# Patient Record
Sex: Female | Born: 1970 | Race: Black or African American | Hispanic: No | Smoking: Never smoker
Health system: Southern US, Community
[De-identification: ages and names within clinical notes are randomized; demographics above are authoritative.]

## PROBLEM LIST (undated history)

## (undated) ENCOUNTER — Emergency Department (HOSPITAL_COMMUNITY): Admission: EM | Disposition: A | Discharge status: Home / Self Care | Payer: BLUE CROSS/BLUE SHIELD

## (undated) VITALS — BP 122/68 | HR 98 | Temp 98.8°F | Resp 18

## (undated) DIAGNOSIS — F319 Bipolar disorder, unspecified: Secondary | ICD-10-CM

## (undated) DIAGNOSIS — F209 Schizophrenia, unspecified: Secondary | ICD-10-CM

## (undated) DIAGNOSIS — F32A Depression, unspecified: Secondary | ICD-10-CM

## (undated) DIAGNOSIS — F329 Major depressive disorder, single episode, unspecified: Secondary | ICD-10-CM

---

## 2006-05-25 ENCOUNTER — Ambulatory Visit: Payer: Self-pay | Admitting: Family Medicine

## 2006-05-27 ENCOUNTER — Ambulatory Visit: Payer: Self-pay | Admitting: *Deleted

## 2006-05-30 ENCOUNTER — Ambulatory Visit: Payer: Self-pay | Admitting: Family Medicine

## 2007-05-26 ENCOUNTER — Inpatient Hospital Stay (HOSPITAL_COMMUNITY): Admission: AD | Admit: 2007-05-26 | Discharge: 2007-05-26 | Payer: Self-pay | Admitting: Obstetrics

## 2007-06-12 DIAGNOSIS — H60509 Unspecified acute noninfective otitis externa, unspecified ear: Secondary | ICD-10-CM

## 2007-08-31 ENCOUNTER — Inpatient Hospital Stay (HOSPITAL_COMMUNITY): Admission: AD | Admit: 2007-08-31 | Discharge: 2007-09-02 | Payer: Self-pay | Admitting: Obstetrics

## 2007-11-10 ENCOUNTER — Inpatient Hospital Stay (HOSPITAL_COMMUNITY): Admission: AD | Admit: 2007-11-10 | Discharge: 2007-11-15 | Payer: Self-pay | Admitting: Psychiatry

## 2007-11-13 ENCOUNTER — Ambulatory Visit: Payer: Self-pay | Admitting: Psychiatry

## 2009-01-04 ENCOUNTER — Emergency Department (HOSPITAL_COMMUNITY): Admission: EM | Admit: 2009-01-04 | Discharge: 2009-01-04 | Payer: Self-pay | Admitting: Emergency Medicine

## 2009-01-05 ENCOUNTER — Emergency Department (HOSPITAL_COMMUNITY): Admission: EM | Admit: 2009-01-05 | Discharge: 2009-01-06 | Payer: Self-pay | Admitting: Emergency Medicine

## 2009-01-07 ENCOUNTER — Ambulatory Visit: Payer: Self-pay | Admitting: Psychiatry

## 2009-01-07 ENCOUNTER — Emergency Department (HOSPITAL_COMMUNITY): Admission: EM | Admit: 2009-01-07 | Discharge: 2009-01-07 | Payer: Self-pay | Admitting: Emergency Medicine

## 2009-01-07 ENCOUNTER — Inpatient Hospital Stay (HOSPITAL_COMMUNITY): Admission: EM | Admit: 2009-01-07 | Discharge: 2009-01-15 | Payer: Self-pay | Admitting: Psychiatry

## 2009-01-17 ENCOUNTER — Emergency Department (HOSPITAL_COMMUNITY): Admission: EM | Admit: 2009-01-17 | Discharge: 2009-01-17 | Payer: Self-pay | Admitting: Emergency Medicine

## 2009-01-17 ENCOUNTER — Inpatient Hospital Stay (HOSPITAL_COMMUNITY): Admission: AD | Admit: 2009-01-17 | Discharge: 2009-01-31 | Payer: Self-pay | Admitting: Psychiatry

## 2010-11-30 LAB — CBC
HCT: 35.1 % — ABNORMAL LOW (ref 36.0–46.0)
HCT: 35.7 % — ABNORMAL LOW (ref 36.0–46.0)
Hemoglobin: 12 g/dL (ref 12.0–15.0)
Hemoglobin: 12.3 g/dL (ref 12.0–15.0)
MCHC: 34.3 g/dL (ref 30.0–36.0)
MCV: 88.6 fL (ref 78.0–100.0)
RDW: 12.2 % (ref 11.5–15.5)
RDW: 13.3 % (ref 11.5–15.5)
WBC: 5.2 10*3/uL (ref 4.0–10.5)

## 2010-12-01 LAB — BASIC METABOLIC PANEL
CO2: 26 mEq/L (ref 19–32)
Calcium: 9.3 mg/dL (ref 8.4–10.5)
Chloride: 104 mEq/L (ref 96–112)
Creatinine, Ser: 0.6 mg/dL (ref 0.4–1.2)
Creatinine, Ser: 0.55 mg/dL (ref 0.4–1.2)
GFR calc Af Amer: >60 mL/min (ref >60)
GFR calc Af Amer: >60 mL/min (ref >60)
GFR calc non Af Amer: >60 mL/min (ref >60)
GFR calc non Af Amer: >60 mL/min (ref >60)
Potassium: 3.9 mEq/L (ref 3.5–5.1)
Sodium: 139 mEq/L (ref 135–145)

## 2010-12-01 LAB — DIFFERENTIAL
Basophils Absolute: 0 10*3/uL (ref 0.0–0.1)
Basophils Absolute: 0 10*3/uL (ref 0.0–0.1)
Basophils Absolute: 0 10*3/uL (ref 0.0–0.1)
Basophils Relative: 1 % (ref 0–1)
Basophils Relative: 1 % (ref 0–1)
Eosinophils Absolute: 0 10*3/uL (ref 0.0–0.7)
Eosinophils Relative: 2 % (ref 0–5)
Lymphocytes Relative: 27 % (ref 12–46)
Lymphocytes Relative: 29 % (ref 12–46)
Lymphocytes Relative: 33 % (ref 12–46)
Lymphocytes Relative: 25 % (ref 12–46)
Lymphs Abs: 1.3 10*3/uL (ref 0.7–4.0)
Monocytes Absolute: 0.2 10*3/uL (ref 0.1–1.0)
Monocytes Relative: 5 % (ref 3–12)
Monocytes Relative: 3 % (ref 3–12)
Neutro Abs: 2.8 10*3/uL (ref 1.7–7.7)
Neutro Abs: 3 10*3/uL (ref 1.7–7.7)
Neutro Abs: 3.5 10*3/uL (ref 1.7–7.7)
Neutrophils Relative %: 60 % (ref 43–77)
Neutrophils Relative %: 65 % (ref 43–77)
Neutrophils Relative %: 65 % (ref 43–77)
Neutrophils Relative %: 70 % (ref 43–77)

## 2010-12-01 LAB — URINALYSIS, ROUTINE W REFLEX MICROSCOPIC
Bilirubin Urine: NEGATIVE
Glucose, UA: NEGATIVE mg/dL
Hgb urine dipstick: NEGATIVE
Hgb urine dipstick: NEGATIVE
Ketones, ur: 40 mg/dL — AB
Ketones, ur: NEGATIVE mg/dL
Nitrite: NEGATIVE
Nitrite: NEGATIVE
Nitrite: NEGATIVE
Protein, ur: NEGATIVE mg/dL
Protein, ur: NEGATIVE mg/dL
Specific Gravity, Urine: 1.008 (ref 1.005–1.030)
Specific Gravity, Urine: 1.013 (ref 1.005–1.030)
Specific Gravity, Urine: 1.01 (ref 1.005–1.030)
Urobilinogen, UA: 0.2 mg/dL (ref 0.0–1.0)
Urobilinogen, UA: 0.2 mg/dL (ref 0.0–1.0)
pH: 6.5 (ref 5.0–8.0)
pH: 7 (ref 5.0–8.0)
pH: 7 (ref 5.0–8.0)

## 2010-12-01 LAB — CBC
HCT: 37.5 % (ref 36.0–46.0)
HCT: 40.6 % (ref 36.0–46.0)
Hemoglobin: 12.7 g/dL (ref 12.0–15.0)
Hemoglobin: 13.7 g/dL (ref 12.0–15.0)
Hemoglobin: 13.9 g/dL (ref 12.0–15.0)
MCHC: 32.5 g/dL (ref 30.0–36.0)
MCV: 88.3 fL (ref 78.0–100.0)
MCV: 88.6 fL (ref 78.0–100.0)
MCV: 87.9 fL (ref 78.0–100.0)
Platelets: 249 10*3/uL (ref 150–400)
Platelets: 283 10*3/uL (ref 150–400)
RBC: 4.24 MIL/uL (ref 3.87–5.11)
RBC: 4.76 MIL/uL (ref 3.87–5.11)
RBC: 4.58 MIL/uL (ref 3.87–5.11)
RDW: 12.6 % (ref 11.5–15.5)
RDW: 12.8 % (ref 11.5–15.5)
WBC: 5 10*3/uL (ref 4.0–10.5)
WBC: 5 10*3/uL (ref 4.0–10.5)

## 2010-12-01 LAB — ETHANOL: Alcohol, Ethyl (B): <5 mg/dL (ref 0–10)

## 2010-12-01 LAB — DRUGS OF ABUSE SCREEN W/O ALC, ROUTINE URINE
Barbiturate Quant, Ur: NEGATIVE
Cocaine Metabolites: NEGATIVE
Methadone: NEGATIVE
Phencyclidine (PCP): NEGATIVE

## 2010-12-01 LAB — URINALYSIS, MICROSCOPIC ONLY
Glucose, UA: NEGATIVE mg/dL
Hgb urine dipstick: NEGATIVE
Ketones, ur: NEGATIVE mg/dL
Protein, ur: NEGATIVE mg/dL

## 2010-12-01 LAB — COMPREHENSIVE METABOLIC PANEL
ALT: 19 U/L (ref 0–35)
AST: 16 U/L (ref 0–37)
AST: 17 U/L (ref 0–37)
Albumin: 3.7 g/dL (ref 3.5–5.2)
Alkaline Phosphatase: 31 U/L — ABNORMAL LOW (ref 39–117)
Alkaline Phosphatase: 38 U/L — ABNORMAL LOW (ref 39–117)
BUN: 7 mg/dL (ref 6–23)
BUN: 7 mg/dL (ref 6–23)
CO2: 24 mEq/L (ref 19–32)
CO2: 27 mEq/L (ref 19–32)
Chloride: 105 mEq/L (ref 96–112)
Chloride: 108 mEq/L (ref 96–112)
Creatinine, Ser: 0.51 mg/dL (ref 0.4–1.2)
Creatinine, Ser: 0.54 mg/dL (ref 0.4–1.2)
GFR calc non Af Amer: >60 mL/min (ref >60)
GFR calc non Af Amer: >60 mL/min (ref >60)
Glucose, Bld: 106 mg/dL — ABNORMAL HIGH (ref 70–99)
Glucose, Bld: 109 mg/dL — ABNORMAL HIGH (ref 70–99)
Potassium: 3.7 mEq/L (ref 3.5–5.1)
Potassium: 3.9 mEq/L (ref 3.5–5.1)
Sodium: 137 mEq/L (ref 135–145)
Total Bilirubin: 0.3 mg/dL (ref 0.3–1.2)
Total Bilirubin: 0.5 mg/dL (ref 0.3–1.2)
Total Protein: 7 g/dL (ref 6.0–8.3)

## 2010-12-01 LAB — RAPID URINE DRUG SCREEN, HOSP PERFORMED
Amphetamines: NOT DETECTED
Barbiturates: NOT DETECTED
Barbiturates: NOT DETECTED
Barbiturates: NOT DETECTED
Benzodiazepines: NOT DETECTED
Cocaine: NOT DETECTED
Cocaine: NOT DETECTED
Opiates: NOT DETECTED
Opiates: NOT DETECTED
Tetrahydrocannabinol: NOT DETECTED
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
## 2011-01-05 NOTE — H&P (Signed)
NAMEMarland Kitchen  MALAIJAH, HOUCHEN NO.:  [id number]   MEDICAL RECORD NO.:  [id number]          PATIENT TYPE:  IPS   LOCATION:  0407                          FACILITY:  BH   PHYSICIAN:  Anselm Jungling, MD  DATE OF BIRTH:  [birth year]   DATE OF ADMISSION:  11/10/2007  DATE OF DISCHARGE:                       PSYCHIATRIC ADMISSION ASSESSMENT   This is an involuntary admission to the services of Dr. Geralyn Flash.   IDENTIFYING INFORMATION:  This is a 40 year old married [place] American  female from [place].  She was brought by [facility] to the  [facility] ED regarding postpartum depression.  Over the past few days  she has admitted to suicidal ideation and had recently stopped breast  feeding her baby.  She has been visibly confused and depressed.  She  speaks some [place].  Earlier in the morning she did have a family  friend here who could interpret, however, nursing got the impression  that she did not want to speak freely in front of the interpreter.   PAST PSYCHIATRIC HISTORY:  There is no known prior psychiatric history  although in talking with her, she states she has been depressed before  but she could not give [place] dates or fill in the details.   SOCIAL HISTORY:  She is currently married.  She states she has only been  married once.  This is her third child.  She does not work outside the  home.  She denies alcohol or drug history.   PRIMARY CARE PHYSICIAN:  Her primary care Leonia Heatherly is Dr. Donia Guiles.   MEDICAL PROBLEMS:  None known.   MEDICATIONS:  None.   DRUG ALLERGIES:  NONE.   POSITIVE PHYSICAL EXAMINATION:  She was medically cleared in the ED at  [facility].   LABS:  She had no remarkable labs.   PHYSICAL EXAMINATION:  VITAL SIGNS:  On admission to our unit showed  that she is 5 feet 3 inches tall.  She weighs 132.  Temperature is 96.3,  blood pressure is 131/95, pulse is 87, respirations are 14.  GENERAL:  They say that she had BCG  in the past.  She has complained of  right calf pain and low back pain but no recently.   MENTAL STATUS EXAM:  Today she is alert and oriented.  She is  appropriately groomed, dressed, and nourished.  Her speech is soft and  slow.  Her mood is depressed.  Her affect is congruent.  Her though  processes seem to be confused.  Judgment and insight are fair.  Concentration and memory are fair.  Intelligence is at least average.  She does acknowledge that she is suicidal, with no specific plan.  She  denies being homicidal, and she is not clear as to whether she is in  fact hearing auditory hallucinations.   DIAGNOSES:  AXIS I:  Major depressive disorder, single episode,  moderate, versus postpartum depression.  AXIS II:  Deferred.  AXIS III:  Postpartum 3 months.  History for taking BCG.  AXIS IV:  There was some communication that there may be issues with her  husband.  AXIS V:  38.   PLAN:  Admit for safety and stabilization.  We are going to initiate  medication.  Toward that end she was started on Lexapro 10 mg p.o.  q.a.m. and Zyprexa/Zydis 5 mg at h.s. to help with sleep and to clarify  her thoughts, and we will try to get with the interpreter to see if we  can get to the bottom of this.  Estimated length of stay is 4-5 days.      Mickie Leonarda Salon, P.A.-C.      Anselm Jungling, MD  Electronically Signed    MD/MEDQ  D:  11/11/2007  T:  11/11/2007  Job:  [id number]

## 2011-01-05 NOTE — Discharge Summary (Signed)
NAMEMarland Avila  HAISLEE, CORSO NO.:  [id number]   MEDICAL RECORD NO.:  [id number]          PATIENT TYPE:  IPS   LOCATION:  0305                          FACILITY:  BH   PHYSICIAN:  Anselm Jungling, MD  DATE OF BIRTH:  February 01, 1971   DATE OF ADMISSION:  01/17/2009  DATE OF DISCHARGE:  01/31/2009                               DISCHARGE SUMMARY   IDENTIFYING DATA AND REASON FOR ADMISSION:  This was a readmission to  [facility] for Alyssa Avila, a 40-  year-old [place] married woman who returned to [place] for further  treatment of severe schizophrenia.  Please refer to the admission note  for further details pertaining to the symptoms, circumstances and  history that led to her hospitalization.  She was given an initial Axis  I diagnosis of schizophrenia, chronic undifferentiated type.   MEDICAL AND LABORATORY:  The patient was again medically and physically  assessed by the psychiatric nurse practitioner.  She was in good health  without any active or chronic medical problems.  She had some mild  tachycardia and mild orthostatic hypotension during her stay, but these  appeared to be mild side effects from her psychotropic medications, and  there was no evidence that they represented any underlying  pathophysiology or medical problem.   HOSPITAL COURSE:  The patient was admitted to the adult inpatient  psychiatric service.  She presented as a well-nourished, normally-  developed adult female who was significantly confused, with delusional  and disorganized thinking.   We had discharged her after a 2-week stay only several days before.  She  had made good progress in her previous stay and stabilized well on a  regimen of Abilify and Lexapro.  However, once home, she was missed 1 or  2 doses of her medications and quickly decompensated.   Given the patient's history of treatment-resistant schizophrenia, it was  felt that she was a candidate  for Clozaril trial.  This was initiated at  25 mg b.i.d., and titrated upwards over a period of approximately 1  week.  It was titrated to an ultimate level of 50 mg b.i.d. and 100 mg  q.h.s.  WBCs were monitored and remained stable.  The patient made  satisfactory gains in terms of her thought organization, judgment, and  insight.   As before, the case manager worked closely with the patient's husband  towards an understanding of the patient's readiness for discharge, which  occurred on approximately the 14th hospital day.  The patient and her  husband agreed to the following aftercare plan.   AFTERCARE:  The patient was to follow up with the [facility] assertive  community treatment team immediately upon discharge.   DISCHARGE MEDICATIONS:  Clozaril 50 mg b.i.d. and 100 mg q.h.s., Lexapro  20 mg daily, Ambien 10 mg daily at bedtime as needed for sleep.   DISCHARGE DIAGNOSES:  AXIS I:  Schizophrenia, chronic paranoid type,  acute exacerbation, resolving.  AXIS II:  Deferred.  AXIS III:  No acute or chronic illnesses.  AXIS IV:  Stressors, severe.  AXIS V:  Global assessment of functioning on  discharge 50.      Anselm Jungling, MD  Electronically Signed     SPB/MEDQ  D:  01/31/2009  T:  01/31/2009  Job:  [phone]
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
## 2011-05-13 LAB — CBC
HCT: 38.9
Hemoglobin: 13.6
MCHC: 34.7
MCHC: 34.9
MCV: 88.3
MCV: 88.5
Platelets: 154
RBC: 3.94
RDW: 14.1
RDW: 14.1

## 2011-05-17 LAB — RAPID URINE DRUG SCREEN, HOSP PERFORMED
Amphetamines: NOT DETECTED
Barbiturates: NOT DETECTED
Benzodiazepines: NOT DETECTED
Cocaine: NOT DETECTED
Opiates: NOT DETECTED
Tetrahydrocannabinol: NOT DETECTED

## 2011-05-17 LAB — BASIC METABOLIC PANEL
GFR calc Af Amer: >60
GFR calc non Af Amer: >60
Potassium: 3.2 — ABNORMAL LOW
Sodium: 139

## 2011-05-17 LAB — I-STAT 8, (EC8 V) (CONVERTED LAB)
Acid-base deficit: 8 — ABNORMAL HIGH
BUN: 14
Bicarbonate: 17.2 — ABNORMAL LOW
Chloride: 107
Glucose, Bld: 91
HCT: 44
Hemoglobin: 15
Operator id: 270651
Potassium: 3.9
Sodium: 139
TCO2: 18
pCO2, Ven: 32.7 — ABNORMAL LOW
pH, Ven: 7.33 — ABNORMAL HIGH

## 2011-05-17 LAB — URINE MICROSCOPIC-ADD ON

## 2011-05-17 LAB — URINALYSIS, ROUTINE W REFLEX MICROSCOPIC
Glucose, UA: NEGATIVE
Hgb urine dipstick: NEGATIVE
Ketones, ur: >80 — AB
Leukocytes, UA: NEGATIVE
Nitrite: NEGATIVE
Protein, ur: 100 — AB
Specific Gravity, Urine: 1.036 — ABNORMAL HIGH
Urobilinogen, UA: 1
pH: 6

## 2011-05-17 LAB — CBC
HCT: 39.5
Hemoglobin: 13.6
WBC: 6.5

## 2011-05-17 LAB — ETHANOL: Alcohol, Ethyl (B): 6

## 2011-05-17 LAB — DIFFERENTIAL
Eosinophils Relative: 6 — ABNORMAL HIGH
Lymphocytes Relative: 33
Lymphs Abs: 2.2
Monocytes Absolute: 0.5

## 2011-05-17 LAB — PREGNANCY, URINE: Preg Test, Ur: NEGATIVE

## 2011-06-03 LAB — URINALYSIS, ROUTINE W REFLEX MICROSCOPIC
Bilirubin Urine: NEGATIVE
Glucose, UA: NEGATIVE
Ketones, ur: NEGATIVE
pH: 7.5

## 2011-06-03 LAB — URINE MICROSCOPIC-ADD ON

## 2013-03-14 ENCOUNTER — Other Ambulatory Visit (HOSPITAL_COMMUNITY): Payer: Self-pay | Admitting: Internal Medicine

## 2013-03-14 DIAGNOSIS — Z1231 Encounter for screening mammogram for malignant neoplasm of breast: Secondary | ICD-10-CM

## 2013-03-21 ENCOUNTER — Ambulatory Visit (HOSPITAL_COMMUNITY): Payer: Self-pay

## 2013-04-05 ENCOUNTER — Ambulatory Visit (HOSPITAL_COMMUNITY)
Admission: RE | Admit: 2013-04-05 | Discharge: 2013-04-05 | Disposition: A | Discharge status: Home / Self Care | Payer: No Typology Code available for payment source | Source: Ambulatory Visit | Attending: Internal Medicine | Admitting: Internal Medicine

## 2013-04-05 DIAGNOSIS — Z1231 Encounter for screening mammogram for malignant neoplasm of breast: Secondary | ICD-10-CM | POA: Insufficient documentation

## 2014-11-03 ENCOUNTER — Emergency Department (INDEPENDENT_AMBULATORY_CARE_PROVIDER_SITE_OTHER)
Admission: EM | Admit: 2014-11-03 | Discharge: 2014-11-03 | Disposition: A | Discharge status: Home / Self Care | Payer: 59 | Source: Home / Self Care | Attending: Family Medicine | Admitting: Family Medicine

## 2014-11-03 ENCOUNTER — Encounter (HOSPITAL_COMMUNITY): Payer: Self-pay | Admitting: *Deleted

## 2014-11-03 ENCOUNTER — Emergency Department (INDEPENDENT_AMBULATORY_CARE_PROVIDER_SITE_OTHER): Payer: 59

## 2014-11-03 DIAGNOSIS — M5412 Radiculopathy, cervical region: Secondary | ICD-10-CM

## 2014-11-03 DIAGNOSIS — M542 Cervicalgia: Secondary | ICD-10-CM

## 2014-11-03 MED ORDER — GABAPENTIN 300 MG PO CAPS: 300.0000 mg | ORAL_CAPSULE | Freq: Every day | ORAL | Status: DC

## 2014-11-03 MED ORDER — DICLOFENAC SODIUM 50 MG PO TBEC: 50.0000 mg | DELAYED_RELEASE_TABLET | Freq: Two times a day (BID) | ORAL | Status: DC | PRN

## 2014-11-03 NOTE — Discharge Instructions (Signed)
Thank you for coming in today. Take diclofenac twice daily for pain. Take gabapentin at bedtime for pain. Take over-the-counter ranitidine or omeprazole for stomach acid protection if you take the diclofenac for more than 2 weeks. Follow-up with Dr. Katrinka BlazingSmith in 2-3 weeks   Cervical Radiculopathy Cervical radiculopathy happens when a nerve in the neck is pinched or bruised by a slipped (herniated) disk or by arthritic changes in the bones of the cervical spine. This can occur due to an injury or as part of the normal aging process. Pressure on the cervical nerves can cause pain or numbness that runs from your neck all the way down into your arm and fingers. CAUSES  There are many possible causes, including:  Injury.  Muscle tightness in the neck from overuse.  Swollen, painful joints (arthritis).  Breakdown or degeneration in the bones and joints of the spine (spondylosis) due to aging.  Bone spurs that may develop near the cervical nerves. SYMPTOMS  Symptoms include pain, weakness, or numbness in the affected arm and hand. Pain can be severe or irritating. Symptoms may be worse when extending or turning the neck. DIAGNOSIS  Your caregiver will ask about your symptoms and do a physical exam. He or she may test your strength and reflexes. X-rays, CT scans, and MRI scans may be needed in cases of injury or if the symptoms do not go away after a period of time. Electromyography (EMG) or nerve conduction testing may be done to study how your nerves and muscles are working. TREATMENT  Your caregiver may recommend certain exercises to help relieve your symptoms. Cervical radiculopathy can, and often does, get better with time and treatment. If your problems continue, treatment options may include:  Wearing a soft collar for short periods of time.  Physical therapy to strengthen the neck muscles.  Medicines, such as nonsteroidal anti-inflammatory drugs (NSAIDs), oral corticosteroids, or spinal  injections.  Surgery. Different types of surgery may be done depending on the cause of your problems. HOME CARE INSTRUCTIONS   Put ice on the affected area.  Put ice in a plastic bag.  Place a towel between your skin and the bag.  Leave the ice on for 15-20 minutes, 03-04 times a day or as directed by your caregiver.  If ice does not help, you can try using heat. Take a warm shower or bath, or use a hot water bottle as directed by your caregiver.  You may try a gentle neck and shoulder massage.  Use a flat pillow when you sleep.  Only take over-the-counter or prescription medicines for pain, discomfort, or fever as directed by your caregiver.  If physical therapy was prescribed, follow your caregiver's directions.  If a soft collar was prescribed, use it as directed. SEEK IMMEDIATE MEDICAL CARE IF:   Your pain gets much worse and cannot be controlled with medicines.  You have weakness or numbness in your hand, arm, face, or leg.  You have a high fever or a stiff, rigid neck.  You lose bowel or bladder control (incontinence).  You have trouble with walking, balance, or speaking. MAKE SURE YOU:   Understand these instructions.  Will watch your condition.  Will get help right away if you are not doing well or get worse. Document Released: 05/04/2001 Document Revised: 11/01/2011 Document Reviewed: 03/23/2011 [facility] Patient Information 2015 [employer] [place]. This information is not intended to replace advice given to you by your health care provider. Make sure you discuss any questions you have with  your health care provider. ° °

## 2014-11-03 NOTE — ED Notes (Signed)
Denies injury.  C/O gradual onset right shoulder pain x 20 days.  Has been taking 400mg  Advil with relief, but pain returns.  Pain progressively getting worse.  Denies any parasthesias.

## 2014-11-03 NOTE — ED Provider Notes (Signed)
Alyssa Avila is a 44 y.o. female who presents to Urgent Care today for Right shoulder pain. Patient has a around one month history of right-sided shoulder and neck pain. Pain has developed gradually and is worse with activity. She denies any radiating pain. The pain is located on the right lateral neck from the insertion onto the school to the trapezius into the superior right shoulder and into the anterior superior chest wall. Pain is worse with activity. No weakness or numbness. Pain is worse with neck motion. She's tried ibuprofen which helps.   History reviewed. No pertinent past medical history. History reviewed. No pertinent past surgical history. History  Substance Use Topics  . Smoking status: Never Smoker   . Smokeless tobacco: Not on file  . Alcohol Use: No   ROS as above Medications: No current facility-administered medications for this encounter.   Current Outpatient Prescriptions  Medication Sig Dispense Refill  . diclofenac (VOLTAREN) 50 MG EC tablet Take 1 tablet (50 mg total) by mouth 2 (two) times daily as needed. 60 tablet 0  . gabapentin (NEURONTIN) 300 MG capsule Take 1 capsule (300 mg total) by mouth at bedtime. 30 capsule 0   No Known Allergies   Exam:  BP 111/71 mmHg  Pulse 66  Temp(Src) 97.7 F (36.5 C) (Oral)  Resp 16  SpO2 100%  LMP 10/12/2014 (Exact Date) Gen: Well NAD HEENT: EOMI,  MMM Lungs: Normal work of breathing. CTABL Heart: RRR no MRG Abd: NABS, Soft. Nondistended, Nontender Exts: Brisk capillary refill, warm and well perfused.  Neck: Nontender to spinal midline. Tender palpation right cervical paraspinal, sternocleidomastoid, and trapezius.  Range of motion is intact over patient has pain with left lateral flexion and left rotation. Negative Spurling's test. Upper extremity strength is intact and equal bilaterally. Reflexes are intact and equal bilaterally. Sensation is intact throughout.  Right shoulder normal-appearing  nontender Normal range of motion Negative Yergason's, speed's test, and O'Brien test, Hawkins test and Neer's test. Strength is intact. Contralateral left shoulder nontender with normal motion normal strength negative impingement testing  Pulses are intact bilateral wrists.  No results found for this or any previous visit (from the past 24 hour(s)). Dg Cervical Spine Complete  11/03/2014   CLINICAL DATA:  Right lateral neck pain  EXAM: CERVICAL SPINE  4+ VIEWS  COMPARISON:  None.  FINDINGS: Normal alignment of the cervical spine. The vertebral body heights are well preserved. Disc space narrowing and ventral spurring is noted at C5-6 and C6-7. The prevertebral soft tissue space appears normal. Mild narrowing of the right C5-6 foramina. There is minimal narrowing of the left C5-6 foramina.  IMPRESSION: 1. No fracture or subluxation. 2. Cervical spondylosis with mild narrowing of both C5-6 neuro foramina   Electronically Signed   By: Signa Kellaylor  Stroud M.D.   On: 11/03/2014 10:04    Assessment and Plan: 44 y.o. female with Neck pain with radiation into the C4 or C5 area likely related to DDD at the C5 area. Treat with NSAIDs and gabapentin. Follow up with sports medicine. Return as needed.  Discussed warning signs or symptoms. Please see discharge instructions. Patient expresses understanding.     Rodolph BongEvan S Jurell Basista, MD 11/03/14 1032

## 2014-12-09 ENCOUNTER — Encounter: Payer: Self-pay | Admitting: *Deleted

## 2014-12-09 ENCOUNTER — Other Ambulatory Visit (INDEPENDENT_AMBULATORY_CARE_PROVIDER_SITE_OTHER): Payer: 59

## 2014-12-09 ENCOUNTER — Encounter: Payer: Self-pay | Admitting: Family Medicine

## 2014-12-09 ENCOUNTER — Ambulatory Visit (INDEPENDENT_AMBULATORY_CARE_PROVIDER_SITE_OTHER): Payer: 59 | Admitting: Family Medicine

## 2014-12-09 VITALS — BP 112/74 | HR 76 | Ht 65.0 in | Wt 144.0 lb

## 2014-12-09 DIAGNOSIS — M25511 Pain in right shoulder: Secondary | ICD-10-CM | POA: Diagnosis not present

## 2014-12-09 DIAGNOSIS — M7500 Adhesive capsulitis of unspecified shoulder: Secondary | ICD-10-CM | POA: Insufficient documentation

## 2014-12-09 DIAGNOSIS — M7501 Adhesive capsulitis of right shoulder: Secondary | ICD-10-CM

## 2014-12-09 NOTE — Progress Notes (Signed)
Tawana ScaleZach Smith D.O. [facility] [address] [place], [place] [address] Phone: [phone] Subjective:     Referral from Dr. Denyse Amassorey  CC: Neck and spine pain  BJY:NWGNFAOZHYHPI:Subjective Alyssa Avila is a 44 y.o. female coming in with complaint of neck and spine pain. Patient was seen previously in urgent care one month ago for what seemed to be more of a right shoulder pain. Patient had the pain for approximately 1 month. While there she was diagnosed with more of a cervical radiculopathy. Patient was given anti-inflammatories as well as gabapentin and told to follow-up with another provider. Patient states that most of the pain seems to be on the posterior aspect of the right shoulder and can go to the anterior superior chest wall. Patient though denies any weakness or numbness but can have more pain with activity. Patient states that the medicine she was given previously does not seem to be helping. Patient states that it seems to be worse with repetitive activity and she is now she is started to lose some range of motion recently. Patient rates the severity of pain 8 out of 10 and can wake her up at night. Dull throbbing sensation with sharp pain with certain movements.   Patient did have cervical neck x-rays taken 11/03/2014. These were reviewed by me today and show patient does have some mild disc space narrowing of C5-C7 and mild narrowing of the right C5-6 foraminal.   No past medical history on file. No past surgical history on file. No family history on file. History  Substance Use Topics  . Smoking status: Never Smoker   . Smokeless tobacco: Not on file  . Alcohol Use: No   No Known Allergies  Past medical history, social, surgical and family history all reviewed in electronic medical record.   Review of Systems: No headache, visual changes, nausea, vomiting, diarrhea, constipation, dizziness, abdominal pain, skin rash, fevers, chills, night sweats, weight loss, swollen lymph  nodes, body aches, joint swelling, muscle aches, chest pain, shortness of breath, mood changes.   Objective Blood pressure 112/74, pulse 76, height 5\' 5"  (1.651 m), weight 144 lb (65.318 kg), SpO2 98 %.  General: No apparent distress alert and oriented x3 mood and affect normal, dressed appropriately.  HEENT: Pupils equal, extraocular movements intact  Respiratory: Patient's speak in full sentences and does not appear short of breath  Cardiovascular: No lower extremity edema, non tender, no erythema  Skin: Warm dry intact with no signs of infection or rash on extremities or on axial skeleton.  Abdomen: Soft nontender  Neuro: Cranial nerves II through XII are intact, neurovascularly intact in all extremities with 2+ DTRs and 2+ pulses.  Lymph: No lymphadenopathy of posterior or anterior cervical chain or axillae bilaterally.  Gait normal with good balance and coordination.  MSK:  Non tender with full range of motion and good stability and symmetric strength and tone of shoulders, elbows, wrist, hip, knee and ankles bilaterally.  Neck: Inspection unremarkable. No palpable stepoffs. Negative Spurling's maneuver. Full neck range of motion Grip strength and sensation normal in bilateral hands Strength good C4 to T1 distribution No sensory change to C4 to T1 Negative Hoffman sign bilaterally Reflexes normal Shoulder: Right Inspection reveals no abnormalities, atrophy or asymmetry. Palpation is normal with no tenderness over AC joint or bicipital groove. ROM is full in all planes passively. Rotator cuff strength normal throughout. signs of impingement with positive Neer and Hawkin's tests, but negative empty can sign. Speeds and [place]ergason's  tests normal. No labral pathology noted with negative Obrien's, negative clunk and good stability. Normal scapular function observed. No painful arc and no drop arm sign. No apprehension sign  MSK US performed of: Right This study was ordered,  performed, and interpreted by Terrilee Files D.O.  Shoulder:   Supraspinatus:  Appears normal on long and transverse views, Bursal bulge seen with shoulder abduction on impingement view. Infraspinatus:  Appears normal on long and transverse views. Significant increase in Doppler flow Subscapularis:  Appears normal on long and transverse views. Positive bursa Teres Minor:  Appears normal on long and transverse views. AC joint:  Capsule undistended, no geyser sign. Glenohumeral Joint:  Appears normal without effusion. She no does have significant thickening of the capsule on the posterior aspect Glenoid Labrum:  Intact without visualized tears. Biceps Tendon:  Appears normal on long and transverse views, no fraying of tendon, tendon located in intertubercular groove, no subluxation with shoulder internal or external rotation.  Impression: Subacromial bursitis, adhesive capsulitis  Procedure: Real-time Ultrasound Guided Injection of right glenohumeral joint Device: GE Logiq E  Ultrasound guided injection is preferred based studies that show increased duration, increased effect, greater accuracy, decreased procedural pain, increased response rate with ultrasound guided versus blind injection.  Verbal informed consent obtained.  Time-out conducted.  Noted no overlying erythema, induration, or other signs of local infection.  Skin prepped in a sterile fashion.  Local anesthesia: Topical Ethyl chloride.  With sterile technique and under real time ultrasound guidance:  Joint visualized.  23g 1  inch needle inserted posterior approach. Pictures taken for needle placement. Patient did have injection of 2 cc of 1% lidocaine, 2 cc of 0.5% Marcaine, and 1.0 cc of Kenalog 40 mg/dL. Completed without difficulty  Pain immediately resolved suggesting accurate placement of the medication.  Advised to call if fevers/chills, erythema, induration, drainage, or persistent bleeding.  Images permanently stored and  available for review in the ultrasound unit.  Impression: Technically successful ultrasound guided injection.  Procedure note 97110; 15 minutes spent for Therapeutic exercises as stated in above notes.  This included exercises focusing on stretching, strengthening, with significant focus on eccentric aspects. Shoulder Exercises that included:  Basic scapular stabilization to include adduction and depression of scapula Scaption, focusing on proper movement and good control Internal and External rotation utilizing a theraband, with elbow tucked at side entire time Rows with theraband  Proper technique shown and discussed handout in great detail with ATC.  All questions were discussed and answered.     Impression and Recommendations:     This case required medical decision making of moderate complexity.

## 2014-12-09 NOTE — Progress Notes (Signed)
Pre visit review using our clinic review tool, if applicable. No additional management support is needed unless otherwise documented below in the visit note. 

## 2014-12-09 NOTE — Assessment & Plan Note (Signed)
Patient was given an injection today and tolerated the procedure very well. Patient had increasing range of motion almost immediately. We discussed home exercises and patient did work with Event organiserathletic trainer today. We discussed icing regimen and topical anti-inflammatories. Patient will also try over-the-counter medications. Patient come back in 3 weeks' patient knows that there is a chance the patient will need 1 more injection.

## 2014-12-09 NOTE — Patient Instructions (Addendum)
Good to see you.  Ice 20 minutes 2 times daily. Usually after activity and before bed. Exercises 3-5 times a week.  Try vitamin D 2000 IU daily Turmeric 500mg  daily See me again in 3 weeks.

## 2014-12-22 ENCOUNTER — Emergency Department (HOSPITAL_COMMUNITY)
Admission: EM | Admit: 2014-12-22 | Discharge: 2014-12-24 | Disposition: A | Discharge status: Other Acute Inpatient Hospital | Payer: 59 | Attending: Emergency Medicine | Admitting: Emergency Medicine

## 2014-12-22 ENCOUNTER — Encounter (HOSPITAL_COMMUNITY): Payer: Self-pay | Admitting: *Deleted

## 2014-12-22 DIAGNOSIS — R000 Tachycardia, unspecified: Secondary | ICD-10-CM | POA: Diagnosis not present

## 2014-12-22 DIAGNOSIS — R1084 Generalized abdominal pain: Secondary | ICD-10-CM | POA: Diagnosis not present

## 2014-12-22 DIAGNOSIS — G4700 Insomnia, unspecified: Secondary | ICD-10-CM | POA: Diagnosis present

## 2014-12-22 DIAGNOSIS — R630 Anorexia: Secondary | ICD-10-CM | POA: Insufficient documentation

## 2014-12-22 DIAGNOSIS — R109 Unspecified abdominal pain: Secondary | ICD-10-CM | POA: Diagnosis present

## 2014-12-22 DIAGNOSIS — F209 Schizophrenia, unspecified: Secondary | ICD-10-CM | POA: Insufficient documentation

## 2014-12-22 DIAGNOSIS — R821 Myoglobinuria: Secondary | ICD-10-CM | POA: Diagnosis not present

## 2014-12-22 DIAGNOSIS — F323 Major depressive disorder, single episode, severe with psychotic features: Secondary | ICD-10-CM | POA: Diagnosis not present

## 2014-12-22 HISTORY — DX: Major depressive disorder, single episode, unspecified: F32.9

## 2014-12-22 HISTORY — DX: Schizophrenia, unspecified: F20.9

## 2014-12-22 HISTORY — DX: Depression, unspecified: F32.A

## 2014-12-22 LAB — COMPREHENSIVE METABOLIC PANEL
ALT: 13 U/L — ABNORMAL LOW (ref 14–54)
AST: 14 U/L — ABNORMAL LOW (ref 15–41)
Albumin: 3.8 g/dL (ref 3.5–5.0)
Alkaline Phosphatase: 44 U/L (ref 38–126)
Anion gap: 11 (ref 5–15)
BUN: 7 mg/dL (ref 6–20)
CO2: 23 mmol/L (ref 22–32)
Calcium: 9.1 mg/dL (ref 8.9–10.3)
Chloride: 105 mmol/L (ref 101–111)
Creatinine, Ser: 0.7 mg/dL (ref 0.44–1.00)
GFR calc Af Amer: >60 mL/min (ref >60)
GFR calc non Af Amer: >60 mL/min (ref >60)
Glucose, Bld: 116 mg/dL — ABNORMAL HIGH (ref 70–99)
Potassium: 3.9 mmol/L (ref 3.5–5.1)
Sodium: 139 mmol/L (ref 135–145)
Total Bilirubin: 0.7 mg/dL (ref 0.3–1.2)
Total Protein: 6.9 g/dL (ref 6.5–8.1)

## 2014-12-22 LAB — CBC WITH DIFFERENTIAL/PLATELET
Basophils Absolute: 0 10*3/uL (ref 0.0–0.1)
Basophils Relative: 0 % (ref 0–1)
Eosinophils Absolute: 0 10*3/uL (ref 0.0–0.7)
Eosinophils Relative: 0 % (ref 0–5)
HCT: 37.3 % (ref 36.0–46.0)
Hemoglobin: 13 g/dL (ref 12.0–15.0)
Lymphocytes Relative: 23 % (ref 12–46)
Lymphs Abs: 1.5 10*3/uL (ref 0.7–4.0)
MCH: 29.3 pg (ref 26.0–34.0)
MCHC: 34.9 g/dL (ref 30.0–36.0)
MCV: 84 fL (ref 78.0–100.0)
Monocytes Absolute: 0.5 10*3/uL (ref 0.1–1.0)
Monocytes Relative: 7 % (ref 3–12)
Neutro Abs: 4.5 10*3/uL (ref 1.7–7.7)
Neutrophils Relative %: 70 % (ref 43–77)
Platelets: 245 10*3/uL (ref 150–400)
RBC: 4.44 MIL/uL (ref 3.87–5.11)
RDW: 13.1 % (ref 11.5–15.5)
WBC: 6.5 10*3/uL (ref 4.0–10.5)

## 2014-12-22 LAB — RAPID URINE DRUG SCREEN, HOSP PERFORMED
Amphetamines: NOT DETECTED
Barbiturates: NOT DETECTED
Benzodiazepines: NOT DETECTED
Cocaine: NOT DETECTED
Opiates: NOT DETECTED
Tetrahydrocannabinol: NOT DETECTED

## 2014-12-22 LAB — ACETAMINOPHEN LEVEL: Acetaminophen (Tylenol), Serum: <10 ug/mL — ABNORMAL LOW (ref 10–30)

## 2014-12-22 LAB — POC URINE PREG, ED: PREG TEST UR: NEGATIVE

## 2014-12-22 LAB — ETHANOL: Alcohol, Ethyl (B): <5 mg/dL (ref <5)

## 2014-12-22 LAB — SALICYLATE LEVEL: Salicylate Lvl: <4 mg/dL (ref 2.8–30.0)

## 2014-12-22 MED ORDER — ONDANSETRON HCL 4 MG PO TABS
4.0000 mg | ORAL_TABLET | Freq: Three times a day (TID) | ORAL | Status: DC | PRN
Start: 2014-12-22 — End: 2014-12-24

## 2014-12-22 MED ORDER — SODIUM CHLORIDE 0.9 % IV BOLUS (SEPSIS)
1000.0000 mL | Freq: Once | INTRAVENOUS | Status: AC
  Administered 2014-12-22: 1000 mL via INTRAVENOUS

## 2014-12-22 MED ORDER — ALUM & MAG HYDROXIDE-SIMETH 200-200-20 MG/5ML PO SUSP: 30.0000 mL | ORAL | Status: DC | PRN

## 2014-12-22 MED ORDER — GABAPENTIN 300 MG PO CAPS
300.0000 mg | ORAL_CAPSULE | Freq: Every day | ORAL | Status: DC
  Administered 2014-12-22 – 2014-12-23 (×2): 300 mg via ORAL
  Filled 2014-12-22 (×2): qty 1

## 2014-12-22 MED ORDER — ZOLPIDEM TARTRATE 5 MG PO TABS
5.0000 mg | ORAL_TABLET | Freq: Every evening | ORAL | Status: DC | PRN
  Administered 2014-12-22 – 2014-12-23 (×2): 5 mg via ORAL
  Filled 2014-12-22 (×2): qty 1

## 2014-12-22 MED ORDER — IBUPROFEN 200 MG PO TABS: 600.0000 mg | ORAL_TABLET | Freq: Three times a day (TID) | ORAL | Status: DC | PRN

## 2014-12-22 MED ORDER — LORAZEPAM 1 MG PO TABS
1.0000 mg | ORAL_TABLET | Freq: Three times a day (TID) | ORAL | Status: DC | PRN
  Administered 2014-12-22: 1 mg via ORAL
  Filled 2014-12-22: qty 1

## 2014-12-22 MED ORDER — ACETAMINOPHEN 325 MG PO TABS: 650.0000 mg | ORAL_TABLET | ORAL | Status: DC | PRN

## 2014-12-22 MED ORDER — NICOTINE 21 MG/24HR TD PT24: 21.0000 mg | MEDICATED_PATCH | Freq: Every day | TRANSDERMAL | Status: DC

## 2014-12-22 NOTE — ED Notes (Signed)
The pt has not been able to sleep for 3 days.  lmp unknown

## 2014-12-22 NOTE — ED Notes (Signed)
Pt is changing into wine colored scrubs

## 2014-12-22 NOTE — BH Assessment (Addendum)
Tele Assessment Note  Pt is an [place] women who is not fluent in [place]. She speaks Medical sales representativeAmharic. Pt gave verbal consent for daughter to act as translator for assessment.   Alyssa Avila is an 44 y.o. female BIB her 45 y.o daughter who is concerned that pt has not been eating or sleeping for the past three days, and has not been very responsive to she and her siblings. Dtr reports pt has been under a great deal of stress due to youngest child being in and out of the hospital lately with bronchial spasms, and pt's husand just leaving for a month long trip back to [place]. [facility] due to psychosis following a miscarriage. At that time pt was dx with schizophrenia. Pt reports she was followed by an ACTT team but this stopped about a year ago because she did not wish to continue to take medication. Pt had time recalling the name of provider and why she was taking medications. Dtr reports pt has seemed confused and worried this is due to her not sleeping. At time of assessment pt was alert but appeared exhausted. She answer a couple of questions in [place] but generally responded to her dtr in [place]. Pt was oriented to person, place, and time however stated she came to hospital due to having a sore throat. Pt denies SI, HI, AVH, self-harm or SA. Pt reports she gets along well with household members and has a strong support system. She listed work as a stressor for her at times.   Pt reports she has been struggling with periods of depression on and off for the past six years. She reports current episode started about three days ago. She reports feeling hopeless and helpless, trouble sleeping, no appetite, worried thoughts, and no energy. She denies SI both past or present.   She denies struggling with anxiety but reports racing heatbeat, often when she loses sleep. She denies hx of trauma or abuse. Denies specific phobias, OCD, or PTSD sx. She reports she does clean a  lot at her home.   Pt denies SA, denies family hx of SA, MH, or suicide.   Pt reports if the doctor would like her to be admitted she would sign herself in voluntarily. She and dtr reports that family does have support and help of neighbors, who are currently caring for younger siblings. Dtr expressed concerns multiple times that she would like mother to get medication to help her sleep.   Axis I: 295.90 Schizophrenia per history, 311 Unspecified Depressive Disorder  Past Medical History: History reviewed. No pertinent past medical history.  History reviewed. No pertinent past surgical history.  Family History: No family history on file.  Social History:  reports that she has never smoked. She does not have any smokeless tobacco history on file. She reports that she does not drink alcohol or use illicit drugs.  Additional Social History:  Alcohol / Drug Use Pain Medications: See PTA Prescriptions: See PTA, pt reports she take vitamin D Over the Counter: See PTA History of alcohol / drug use?: No history of alcohol / drug abuse Longest period of sobriety (when/how long):  (NA) Negative Consequences of Use:  (NA)  CIWA: CIWA-Ar BP: 144/77 mmHg Pulse Rate: 97 COWS:    PATIENT STRENGTHS: (choose at least two) Supportive family/friends Work skills  Allergies: No Known Allergies  Home Medications:  (Not in a hospital admission)  OB/GYN Status:  Patient's last menstrual period was  11/12/2014 (approximate).  General Assessment Data Location of Assessment: [facility] ED TTS Assessment: In system Is this a Tele or Face-to-Face Assessment?: Tele Assessment Is this an Initial Assessment or a Re-assessment for this encounter?: Initial Assessment Living Arrangements: Spouse/significant other, Children (husband, sons 49, 23, and 6 y.o dtr) Can pt return to current living arrangement?: Yes Admission Status: Voluntary Is patient capable of signing voluntary admission?: Yes Transfer from:  Home Referral Source: Self/Family/Friend     Crisis Care Plan Living Arrangements: Spouse/significant other, Children (husband, sons 39, 35, and 69 y.o dtr) Name of Psychiatrist: none Name of Therapist: none  Education Status Is patient currently in school?: No Current Grade: NA Highest grade of school patient has completed: 12 Name of school: NA Contact person: NA  Risk to self with the past 6 months Suicidal Ideation: No Has patient been a risk to self within the past 6 months prior to admission? : No Suicidal Intent: No Has patient had any suicidal intent within the past 6 months prior to admission? : No Is patient at risk for suicide?: No Suicidal Plan?: No Has patient had any suicidal plan within the past 6 months prior to admission? : No Access to Means: No What has been your use of drugs/alcohol within the last 12 months?: none Previous Attempts/Gestures: No How many times?: 0 Other Self Harm Risks: none Triggers for Past Attempts: None known Intentional Self Injurious Behavior: None Family Suicide History: No Recent stressful life event(s): Other (Comment) (son has been ill, SO out of country ) Persecutory voices/beliefs?: No Depression: Yes Depression Symptoms: Insomnia, Loss of interest in usual pleasures, Despondent, Fatigue Substance abuse history and/or treatment for substance abuse?: No Suicide prevention information given to non-admitted patients: Yes  Risk to Others within the past 6 months Homicidal Ideation: No Does patient have any lifetime risk of violence toward others beyond the six months prior to admission? : No Thoughts of Harm to Others: No Current Homicidal Intent: No Current Homicidal Plan: No Access to Homicidal Means: No Identified Victim: none History of harm to others?: No Assessment of Violence: None Noted Violent Behavior Description: none Does patient have access to weapons?: No Criminal Charges Pending?: No Does patient have a  court date: No Is patient on probation?: No  Psychosis Hallucinations: None noted (hx delusions reported 6 years ago) Delusions:  (in past none reported currently )  Mental Status Report Appearance/Hygiene: Unremarkable Eye Contact: Good Motor Activity: Rigidity Speech: Unable to assess, Soft, Other (Comment) (english is limited) Level of Consciousness: Alert (very tired) Mood: Depressed Affect: Flat Anxiety Level: None Thought Processes: Coherent, Relevant Judgement: Partial Orientation: Person, Place, Time (situation questionable here for "sore throat") Obsessive Compulsive Thoughts/Behaviors: Minimal  Cognitive Functioning Concentration: Decreased Memory: Recent Impaired, Remote Impaired IQ: Average Insight: Poor Impulse Control: Good Appetite: Poor Weight Loss:  ("a bit") Weight Gain: 0 Sleep: Decreased Total Hours of Sleep: 0 (no sleep for about three days ) Vegetative Symptoms: None  ADLScreening [facility]) Patient's cognitive ability adequate to safely complete daily activities?: Yes Patient able to express need for assistance with ADLs?:  (uncertain at this time) Independently performs ADLs?: Yes (appropriate for developmental age)  Prior Inpatient Therapy Prior Inpatient Therapy: Yes Prior Therapy Dates: 2006 Prior Therapy Facilty/Provider(s): [facility] twice Reason for Treatment: psychosis   Prior Outpatient Therapy Prior Outpatient Therapy: Yes Prior Therapy Dates: reports had an ACT team but that this stopped about a year ago because she no longer wanted to take medication  Prior  Therapy Facilty/Provider(s): ACT team pt could not recall which provider Reason for Treatment: psychosis  Does patient have an ACCT team?: No Does patient have Intensive In-House Services?  : No Does patient have [facility] services? : No Does patient have P4CC services?: No  ADL Screening (condition at time of admission) Patient's cognitive ability adequate to safely  complete daily activities?: Yes Is the patient deaf or have difficulty hearing?: No Does the patient have difficulty seeing, even when wearing glasses/contacts?: No Does the patient have difficulty concentrating, remembering, or making decisions?: Yes Patient able to express need for assistance with ADLs?:  (uncertain at this time) Does the patient have difficulty dressing or bathing?: No Independently performs ADLs?: Yes (appropriate for developmental age) Does the patient have difficulty walking or climbing stairs?: No Weakness of Arms/Hands: None (reports she has a "frozen shouldder")  Home Assistive Devices/Equipment Home Assistive Devices/Equipment: None    Abuse/Neglect Assessment (Assessment to be complete while patient is alone) Physical Abuse: Denies Verbal Abuse: Denies Sexual Abuse: Denies Exploitation of patient/patient's resources: Denies Self-Neglect: Denies Values / Beliefs Cultural Requests During Hospitalization: Other (comment) (Pt is [place] and not native Programme researcher, broadcasting/film/video, she speaks Medical sales representative) Spiritual Requests During Hospitalization: None   Merchant navy officer (For Healthcare) Does patient have an advance directive?: No Would patient like information on creating an advanced directive?: No - patient declined information    Additional Information 1:1 In Past 12 Months?: No CIRT Risk: No Elopement Risk: No Does patient have medical clearance?: No     Disposition:  Per Hulan Fess, NP pt should have a psychiatric assessment after obtaining some hours of sleep to determine final disposition.   Informed PA of plan and she is in agreement, informed RN who will inform pt and dtr, and obtain contact information for dtr is she is leaving to night as she may be needed to translate when pt sees the psychiatrist.      Alyssa Avila, [facility] Triage Specialist 12/22/2014 3:16 AM  Yuta Cipollone M 12/22/2014 3:14 AM

## 2014-12-22 NOTE — ED Provider Notes (Signed)
CSN: [id number]     Arrival date & time 12/22/14  0041 History   First MD Initiated Contact with Patient 12/22/14 0109     Chief Complaint  Patient presents with  . cant sleep      (Consider location/radiation/quality/duration/timing/severity/associated sxs/prior Treatment) The history is provided by a relative. A language interpreter was used (daughter. pt declined Adult nurseprofessional translator).  Pt is a 44yo female brought to ED by daughter with concerns for pt not eating, drinking, or sleeping for 3 days.  Daughter states pt does not really respond to her or her siblings when they talk to her.  Daughter reports that her father (pt's husband) left the family a few days ago, just prior to pt's change in behavior and believes pt has put a lot of stress onto herself since then.  Daughter reports similar episode occurred about 6 years ago.  Pt was sent to a psychiatric hospital and placed on medication, however, about 1 year ago, pt was taken off the medication and has not had psychiatric f/u since then.  Daughter does not recall the name of the medication or where pt was seen.  Pt has not attempted to harm herself or anyone else.  Daughter is concerned as pt took 3 Tylenol PM and still cannot sleep despite it being well after midnight.  No fever, chills, vomiting, CP, SOB, abdominal pain or any other complaints.    Past Medical History  Diagnosis Date  . Schizophrenia   . Depression    History reviewed. No pertinent past surgical history. No family history on file. History  Substance Use Topics  . Smoking status: Never Smoker   . Smokeless tobacco: Not on file  . Alcohol Use: No   OB History    No data available     Review of Systems  Unable to perform ROS: Patient nonverbal      Allergies  Review of patient's allergies indicates no known allergies.  Home Medications   Prior to Admission medications   Medication Sig Start Date End Date Taking? Authorizing Provider  diclofenac  (VOLTAREN) 50 MG EC tablet Take 1 tablet (50 mg total) by mouth 2 (two) times daily as needed. 11/03/14   Rodolph BongEvan S Corey, MD  gabapentin (NEURONTIN) 300 MG capsule Take 1 capsule (300 mg total) by mouth at bedtime. 11/03/14   Rodolph BongEvan S Corey, MD   BP 148/96 mmHg  Pulse 95  Temp(Src) 98.7 F (37.1 C) (Oral)  Resp 16  Wt 139 lb 1.6 oz (63.095 kg)  SpO2 100%  LMP 11/12/2014 (Approximate) Physical Exam  Constitutional: She appears well-developed and well-nourished. No distress.  HENT:  Head: Normocephalic and atraumatic.  Eyes: Conjunctivae are normal. No scleral icterus.  Neck: Normal range of motion.  Cardiovascular: Regular rhythm and normal heart sounds.  Tachycardia present.   Pulmonary/Chest: Effort normal and breath sounds normal. No respiratory distress. She has no wheezes. She has no rales. She exhibits no tenderness.  Abdominal: Soft. Bowel sounds are normal. She exhibits no distension and no mass. There is no tenderness. There is no rebound and no guarding.  Musculoskeletal: Normal range of motion.  Neurological: She is alert.  Skin: Skin is warm and dry. She is not diaphoretic.  Psychiatric:  Flat affect, pt alert but avoiding eye contact. Will not speak.   Nursing note and vitals reviewed.   ED Course  Procedures (including critical care time) Labs Review Labs Reviewed  COMPREHENSIVE METABOLIC PANEL - Abnormal; Notable for the following:  Glucose, Bld 116 (*)    AST 14 (*)    ALT 13 (*)    All other components within normal limits  ACETAMINOPHEN LEVEL - Abnormal; Notable for the following:    Acetaminophen (Tylenol), Serum <10 (*)    All other components within normal limits  CBC WITH DIFFERENTIAL/PLATELET  ETHANOL  SALICYLATE LEVEL  URINE RAPID DRUG SCREEN (HOSP PERFORMED)    Imaging Review No results found.   EKG Interpretation None      MDM   Final diagnoses:  None    Pt brought to ED by daughter who is concerned pt is not responding at home, not  sleeping and not eating.  Daughter provided hx as pt is not fluent in [place]. Declined Adult nurse.  Denies SI/HI, however, has psychiatric hx of which she was hospitalized about 6 years ago and on medication.  TTS consulted to help determine pt's tx plan and disposition.  3:38 AM Consulted with Behavioral Health, concern pt may be having an episode of schizophrenia, however, difficult to determine at this time due to pt's lack of sleep over last 3 days, likely related to stresses at home, including pt's son being in and out of the hospital recently.  Will placed psych hold orders, pt will be evaluated by psychiatry later this morning to help determine pt's disposition.    Junius Finner, PA-C 12/22/14 1610  Tomasita Crumble, MD 12/22/14 [phone]

## 2014-12-22 NOTE — ED Notes (Signed)
Patient's daughter who can translate in Amharic's contact info is: Clair Gullingyabsera bekele, [phone]

## 2014-12-22 NOTE — ED Notes (Signed)
pts daughter states pt has not been eating or sleeping for the past 3 days, states that patient does not really respond to her and her siblings when they talk to her. pts daughter states that similar episode happened about 6 years ago and pt was placed on medication that helped her, pt has been off of medications x 1 year. No thoughts of si or hi.

## 2014-12-22 NOTE — BH Assessment (Addendum)
Reviewed ED notes prior to initiating assessment. Per notes pt has not eaten or slept in the past three days and is not really responding when her children are speaking with her. Per pt's dtr a similar incident happened six years ago and pt was placed on medication that helped, however, pt has been off her medications for about a year per dtr.   Requested cart be placed with pt for assessment. RN requests several minutes to retrieve and place cart.   Assessment to commence shortly.  Clista BernhardtNancy Paxton Kanaan, [facility] Triage Specialist 12/22/2014 2:21 AM

## 2014-12-22 NOTE — ED Notes (Signed)
Per shift report, IV fluids completed. Pt had voided in bathroom - no urine specimen obtained.

## 2014-12-22 NOTE — ED Notes (Signed)
PT AWARE OF NEED FOR URINE SPECIMEN. LABELED CUP PLACED AT BEDSIDE.

## 2014-12-22 NOTE — ED Notes (Signed)
Telepsych in process w/Conrad, NP, [facility]. Daughter and neighbor assisting w/interpreting. Offered prof interpretor - pt and family advised no, they felt comfortable interpreting.

## 2014-12-22 NOTE — ED Notes (Signed)
TTS machine placed at bedside.   

## 2014-12-22 NOTE — ED Notes (Signed)
[facility] called stating they would keep patient for the night so she could get some rest, advised a psychiatrist would see patient in the morning. Requested that we get daughter's info so that she could be contacted to translate if necessary since patient speaks Amharic.

## 2014-12-22 NOTE — Consult Note (Signed)
Telepsych Consultation   Reason for Consult:  Psychosis, inability to eat and care for self Referring Physician:  EDP Patient Identification: Alyssa Avila MRN:  [id number] Principal Diagnosis: MDD (major depressive disorder), single episode, severe with psychotic features Diagnosis:   Patient Active Problem List   Diagnosis Date Noted  . MDD (major depressive disorder), single episode, severe with psychotic features [F32.3]     Priority: High  . Frozen shoulder [M75.00] 12/09/2014  . OTITIS EXTERNA, ACUTE NEC [H60.509] 06/12/2007    Total Time spent with patient: 25 minutes  Subjective:   Alyssa Avila is a 44 y.o. female patient admitted with reports of inability to take care of herself, not eating, not sleeping, not bathing. Pt was awake for 3 days straight and attempted to sleep, but could not, reporting rumination about her husband being out of town and her sick family members. Pt did not present with pressured speech nor high energy nor delusional thinking. However, she was mildly disoriented and clearly depressed, lethargic, and low energy. Pt denies suicidal/homicidal ideation but does have some depressive psychosis and bizarre behavior per daughter's report such as "staring into space, eyes glazed over, not making any sense". Pt's daughter present and reports that she was hospitalized a few years ago and was "just like this, but she got much much worse and was very ill last time". Daughter reports that pt quit taking medication a year ago and that she has been fine since until a week ago. Pt and family in agreement for inpatient hospitalization.   HPI:   Pt is an [place] women who is not fluent in [place]. She speaks Patent examiner. Pt gave verbal consent for daughter to act as translator for assessment.   [name] is an 44 y.o. female BIB her 24 y.o daughter who is concerned that pt has not been eating or sleeping for the past three days, and has not been very responsive  to she and her siblings. Dtr reports pt has been under a great deal of stress due to youngest child being in and out of the hospital lately with bronchial spasms, and pt's husand just leaving for a month long trip back to [place]. Pt was previously seen at [facility] for two inpt stays due to psychosis following a miscarriage. At that time pt was dx with schizophrenia. Pt reports she was followed by an ACTT team but this stopped about a year ago because she did not wish to continue to take medication. Pt had time recalling the name of provider and why she was taking medications. Dtr reports pt has seemed confused and worried this is due to her not sleeping. At time of assessment pt was alert but appeared exhausted. She answer a couple of questions in [place] but generally responded to her dtr in [place]. Pt was oriented to person, place, and time however stated she came to hospital due to having a sore throat. Pt denies SI, HI, AVH, self-harm or SA. Pt reports she gets along well with household members and has a strong support system. She listed work as a stressor for her at times.   Pt reports she has been struggling with periods of depression on and off for the past six years. She reports current episode started about three days ago. She reports feeling hopeless and helpless, trouble sleeping, no appetite, worried thoughts, and no energy. She denies SI both past or present.   She denies struggling with anxiety but reports racing heatbeat, often when she loses sleep. She  denies hx of trauma or abuse. Denies specific phobias, OCD, or PTSD sx. She reports she does clean a lot at her home.   HPI Elements:   Location:  Psychiatric. Quality:  Worsening. Severity:  Severe. Timing:  Constant. Duration:  Intermittent, historical and re-emerging. Context:  Exacerbation of underlying history of mental illness with cessation of medications a year ago and progressive depression worsening with triggers of husband on  vacation and sick family members.  Past Medical History:  Past Medical History  Diagnosis Date  . Schizophrenia   . Depression    History reviewed. No pertinent past surgical history. Family History: No family history on file. Social History:  History  Alcohol Use No     History  Drug Use No    History   Social History  . Marital Status: Married    Spouse Name: N/A  . Number of Children: N/A  . Years of Education: N/A   Social History Main Topics  . Smoking status: Never Smoker   . Smokeless tobacco: Not on file  . Alcohol Use: No  . Drug Use: No  . Sexual Activity: Not on file   Other Topics Concern  . None   Social History Narrative   Additional Social History:    Pain Medications: See PTA Prescriptions: See PTA, pt reports she take vitamin D Over the Counter: See PTA History of alcohol / drug use?: No history of alcohol / drug abuse Longest period of sobriety (when/how long):  (NA) Negative Consequences of Use:  (NA)                     Allergies:  No Known Allergies  Labs:  Results for orders placed or performed during the hospital encounter of 12/22/14 (from the past 48 hour(s))  CBC WITH DIFFERENTIAL     Status: None   Collection Time: 12/22/14  2:25 AM  Result Value Ref Range   WBC 6.5 4.0 - 10.5 K/uL   RBC 4.44 3.87 - 5.11 MIL/uL   Hemoglobin 13.0 12.0 - 15.0 g/dL   HCT 37.3 36.0 - 46.0 %   MCV 84.0 78.0 - 100.0 fL   MCH 29.3 26.0 - 34.0 pg   MCHC 34.9 30.0 - 36.0 g/dL   RDW 13.1 11.5 - 15.5 %   Platelets 245 150 - 400 K/uL   Neutrophils Relative % 70 43 - 77 %   Neutro Abs 4.5 1.7 - 7.7 K/uL   Lymphocytes Relative 23 12 - 46 %   Lymphs Abs 1.5 0.7 - 4.0 K/uL   Monocytes Relative 7 3 - 12 %   Monocytes Absolute 0.5 0.1 - 1.0 K/uL   Eosinophils Relative 0 0 - 5 %   Eosinophils Absolute 0.0 0.0 - 0.7 K/uL   Basophils Relative 0 0 - 1 %   Basophils Absolute 0.0 0.0 - 0.1 K/uL  Comprehensive metabolic panel     Status: Abnormal    Collection Time: 12/22/14  2:25 AM  Result Value Ref Range   Sodium 139 135 - 145 mmol/L   Potassium 3.9 3.5 - 5.1 mmol/L   Chloride 105 101 - 111 mmol/L   CO2 23 22 - 32 mmol/L   Glucose, Bld 116 (H) 70 - 99 mg/dL   BUN 7 6 - 20 mg/dL   Creatinine, Ser 0.70 0.44 - 1.00 mg/dL   Calcium 9.1 8.9 - 10.3 mg/dL   Total Protein 6.9 6.5 - 8.1 g/dL   Albumin 3.8 3.5 -  5.0 g/dL   AST 14 (L) 15 - 41 U/L   ALT 13 (L) 14 - 54 U/L   Alkaline Phosphatase 44 38 - 126 U/L   Total Bilirubin 0.7 0.3 - 1.2 mg/dL   GFR calc non Af Amer >60 >60 mL/min   GFR calc Af Amer >60 >60 mL/min    Comment: (NOTE) The eGFR has been calculated using the CKD EPI equation. This calculation has not been validated in all clinical situations. eGFR's persistently <90 mL/min signify possible Chronic Kidney Disease.    Anion gap 11 5 - 15  Ethanol     Status: None   Collection Time: 12/22/14  2:25 AM  Result Value Ref Range   Alcohol, Ethyl (B) <5 <5 mg/dL    Comment:        LOWEST DETECTABLE LIMIT FOR SERUM ALCOHOL IS 11 mg/dL FOR MEDICAL PURPOSES ONLY   Salicylate level     Status: None   Collection Time: 12/22/14  2:25 AM  Result Value Ref Range   Salicylate Lvl <2.2 2.8 - 30.0 mg/dL  Acetaminophen level     Status: Abnormal   Collection Time: 12/22/14  2:25 AM  Result Value Ref Range   Acetaminophen (Tylenol), Serum <10 (L) 10 - 30 ug/mL    Comment:        THERAPEUTIC CONCENTRATIONS VARY SIGNIFICANTLY. A RANGE OF 10-30 ug/mL MAY BE AN EFFECTIVE CONCENTRATION FOR MANY PATIENTS. HOWEVER, SOME ARE BEST TREATED AT CONCENTRATIONS OUTSIDE THIS RANGE. ACETAMINOPHEN CONCENTRATIONS >150 ug/mL AT 4 HOURS AFTER INGESTION AND >50 ug/mL AT 12 HOURS AFTER INGESTION ARE OFTEN ASSOCIATED WITH TOXIC REACTIONS.     Vitals: Blood pressure 136/55, pulse 83, temperature 97.9 F (36.6 C), temperature source Oral, resp. rate 16, weight 63.095 kg (139 lb 1.6 oz), last menstrual period 11/12/2014, SpO2 99 %.  Risk  to Self: Suicidal Ideation: No Suicidal Intent: No Is patient at risk for suicide?: No Suicidal Plan?: No Access to Means: No What has been your use of drugs/alcohol within the last 12 months?: none How many times?: 0 Other Self Harm Risks: none Triggers for Past Attempts: None known Intentional Self Injurious Behavior: None Risk to Others: Homicidal Ideation: No Thoughts of Harm to Others: No Current Homicidal Intent: No Current Homicidal Plan: No Access to Homicidal Means: No Identified Victim: none History of harm to others?: No Assessment of Violence: None Noted Violent Behavior Description: none Does patient have access to weapons?: No Criminal Charges Pending?: No Does patient have a court date: No Prior Inpatient Therapy: Prior Inpatient Therapy: Yes Prior Therapy Dates: 2006 Prior Therapy Facilty/Provider(s): [facility] twice Reason for Treatment: psychosis  Prior Outpatient Therapy: Prior Outpatient Therapy: Yes Prior Therapy Dates: reports had an ACT team but that this stopped about a year ago because she no longer wanted to take medication  Prior Therapy Facilty/Provider(s): ACT team pt could not recall which provider Reason for Treatment: psychosis  Does patient have an ACCT team?: No Does patient have Intensive In-House Services?  : No Does patient have [facility] services? : No Does patient have P4CC services?: No  Current Facility-Administered Medications  Medication Dose Route Frequency Provider Last Rate Last Dose  . acetaminophen (TYLENOL) tablet 650 mg  650 mg Oral Q4H PRN Noland Fordyce, PA-C      . alum & mag hydroxide-simeth (MAALOX/MYLANTA) 200-200-20 MG/5ML suspension 30 mL  30 mL Oral PRN Noland Fordyce, PA-C      . gabapentin (NEURONTIN) capsule 300 mg  300 mg Oral  QHS Noland Fordyce, PA-C      . ibuprofen (ADVIL,MOTRIN) tablet 600 mg  600 mg Oral Q8H PRN Noland Fordyce, PA-C      . LORazepam (ATIVAN) tablet 1 mg  1 mg Oral Q8H PRN Noland Fordyce, PA-C      .  ondansetron [facility]) tablet 4 mg  4 mg Oral Q8H PRN Noland Fordyce, PA-C      . zolpidem (AMBIEN) tablet 5 mg  5 mg Oral QHS PRN Noland Fordyce, PA-C       Current Outpatient Prescriptions  Medication Sig Dispense Refill  . diclofenac (VOLTAREN) 50 MG EC tablet Take 1 tablet (50 mg total) by mouth 2 (two) times daily as needed. 60 tablet 0  . gabapentin (NEURONTIN) 300 MG capsule Take 1 capsule (300 mg total) by mouth at bedtime. 30 capsule 0    Musculoskeletal: UTO, camera  Psychiatric Specialty Exam:     Blood pressure 136/55, pulse 83, temperature 97.9 F (36.6 C), temperature source Oral, resp. rate 16, weight 63.095 kg (139 lb 1.6 oz), last menstrual period 11/12/2014, SpO2 99 %.Body mass index is 23.15 kg/(m^2).  General Appearance: Bizarre and Disheveled  Eye Contact::  Minimal  Speech:  Slow  Volume:  Decreased  Mood:  Depressed  Affect:  Depressed  Thought Process:  Loose  Orientation:  Other:  Self, place  Thought Content:  WDL  Suicidal Thoughts:  No  Homicidal Thoughts:  No  Memory:  Immediate;   Poor Recent;   Poor Remote;   Poor  Judgement:  Impaired  Insight:  Lacking  Psychomotor Activity:  Decreased  Concentration:  Poor  Recall:  Poor  Fund of Knowledge:Fair  Language: Fair  Akathisia:  No  Handed:    AIMS (if indicated):     Assets:  Desire for Improvement Social Support  ADL's:  Impaired  Cognition: WNL  Sleep:      Medical Decision Making: New problem, with additional work up planned, Review of Psycho-Social Stressors (1) and Review or order clinical lab tests (1)   Treatment Plan Summary: Daily contact with patient to assess and evaluate symptoms and progress in treatment  Plan:  Recommend psychiatric Inpatient admission when medically cleared.  Disposition:  -Admit to inpatient psychiatric facility for safety and stabilization.   Benjamine Mola, FNP-BC 12/22/2014 12:38 PM

## 2014-12-23 ENCOUNTER — Emergency Department (HOSPITAL_COMMUNITY): Payer: 59

## 2014-12-23 ENCOUNTER — Encounter (HOSPITAL_COMMUNITY): Payer: Self-pay | Admitting: Psychiatry

## 2014-12-23 DIAGNOSIS — R1084 Generalized abdominal pain: Secondary | ICD-10-CM | POA: Diagnosis not present

## 2014-12-23 DIAGNOSIS — R109 Unspecified abdominal pain: Secondary | ICD-10-CM | POA: Diagnosis present

## 2014-12-23 DIAGNOSIS — R000 Tachycardia, unspecified: Secondary | ICD-10-CM | POA: Diagnosis present

## 2014-12-23 DIAGNOSIS — F323 Major depressive disorder, single episode, severe with psychotic features: Secondary | ICD-10-CM

## 2014-12-23 DIAGNOSIS — R821 Myoglobinuria: Secondary | ICD-10-CM | POA: Diagnosis not present

## 2014-12-23 LAB — URINALYSIS, ROUTINE W REFLEX MICROSCOPIC
Bilirubin Urine: NEGATIVE
GLUCOSE, UA: NEGATIVE mg/dL
KETONES UR: 40 mg/dL — AB
Leukocytes, UA: NEGATIVE
Nitrite: NEGATIVE
PROTEIN: NEGATIVE mg/dL
SPECIFIC GRAVITY, URINE: 1.012 (ref 1.005–1.030)
UROBILINOGEN UA: 0.2 mg/dL (ref 0.0–1.0)
pH: 6.5 (ref 5.0–8.0)

## 2014-12-23 LAB — CBC WITH DIFFERENTIAL/PLATELET
BASOS ABS: 0 10*3/uL (ref 0.0–0.1)
BASOS PCT: 0 % (ref 0–1)
Eosinophils Absolute: 0.1 10*3/uL (ref 0.0–0.7)
Eosinophils Relative: 1 % (ref 0–5)
HCT: 41.8 % (ref 36.0–46.0)
Hemoglobin: 14.5 g/dL (ref 12.0–15.0)
LYMPHS PCT: 12 % (ref 12–46)
Lymphs Abs: 1 10*3/uL (ref 0.7–4.0)
MCH: 29.2 pg (ref 26.0–34.0)
MCHC: 34.7 g/dL (ref 30.0–36.0)
MCV: 84.3 fL (ref 78.0–100.0)
MONO ABS: 0.6 10*3/uL (ref 0.1–1.0)
MONOS PCT: 7 % (ref 3–12)
NEUTROS ABS: 7 10*3/uL (ref 1.7–7.7)
Neutrophils Relative %: 80 % — ABNORMAL HIGH (ref 43–77)
PLATELETS: 231 10*3/uL (ref 150–400)
RBC: 4.96 MIL/uL (ref 3.87–5.11)
RDW: 13.2 % (ref 11.5–15.5)
WBC: 8.6 10*3/uL (ref 4.0–10.5)

## 2014-12-23 LAB — TSH: TSH: 0.653 u[IU]/mL (ref 0.350–4.500)

## 2014-12-23 LAB — BASIC METABOLIC PANEL
Anion gap: 11 (ref 5–15)
BUN: 5 mg/dL — ABNORMAL LOW (ref 6–20)
CHLORIDE: 105 mmol/L (ref 101–111)
CO2: 24 mmol/L (ref 22–32)
CREATININE: 0.63 mg/dL (ref 0.44–1.00)
Calcium: 9.3 mg/dL (ref 8.9–10.3)
Glucose, Bld: 95 mg/dL (ref 70–99)
Potassium: 3.5 mmol/L (ref 3.5–5.1)
Sodium: 140 mmol/L (ref 135–145)

## 2014-12-23 LAB — URINE MICROSCOPIC-ADD ON

## 2014-12-23 LAB — CK: CK TOTAL: 78 U/L (ref 38–234)

## 2014-12-23 LAB — D-DIMER, QUANTITATIVE: D-Dimer, Quant: <0.27 ug/mL-FEU (ref 0.00–0.48)

## 2014-12-23 MED ORDER — ALPRAZOLAM 0.5 MG PO TABS
0.5000 mg | ORAL_TABLET | Freq: Three times a day (TID) | ORAL | Status: DC
  Administered 2014-12-23: 0.5 mg via ORAL
  Filled 2014-12-23: qty 2

## 2014-12-23 MED ORDER — SODIUM CHLORIDE 0.9 % IV BOLUS (SEPSIS)
1000.0000 mL | Freq: Once | INTRAVENOUS | Status: AC
  Administered 2014-12-23: 1000 mL via INTRAVENOUS

## 2014-12-23 MED ORDER — ALPRAZOLAM 0.5 MG PO TABS
1.0000 mg | ORAL_TABLET | Freq: Three times a day (TID) | ORAL | Status: DC
  Administered 2014-12-23 – 2014-12-24 (×2): 1 mg via ORAL
  Filled 2014-12-23 (×2): qty 2

## 2014-12-23 MED ORDER — ALPRAZOLAM 0.5 MG PO TABS
0.5000 mg | ORAL_TABLET | ORAL | Status: AC
Start: 2014-12-23 — End: 2014-12-23
  Administered 2014-12-23: 0.5 mg via ORAL
  Filled 2014-12-23: qty 1

## 2014-12-23 MED ORDER — SODIUM CHLORIDE 0.9 % IV SOLN
INTRAVENOUS | Status: DC
  Administered 2014-12-23 (×2): via INTRAVENOUS

## 2014-12-23 MED ORDER — METOPROLOL TARTRATE 1 MG/ML IV SOLN
5.0000 mg | Freq: Once | INTRAVENOUS | Status: AC
  Administered 2014-12-23: 5 mg via INTRAVENOUS
  Filled 2014-12-23: qty 5

## 2014-12-23 NOTE — BH Assessment (Signed)
[facility] Assessment Progress Note [facility] (per [place]).

## 2014-12-23 NOTE — Progress Notes (Addendum)
Pt's nurse, RN Drinda Buttsnnette has been informed that per CSW Olivia Mackieressa, [facility], pt' to come by 9:30am 12/24/14, that interpreter will be available at [place] at 9:30am tomorrow, and call report when sheriff arrives to transport patient.   Melbourne Abtsatia Dolly Harbach, LCSWA Disposition staff 12/23/2014 5:37 PM

## 2014-12-23 NOTE — ED Notes (Signed)
Pt refused to eat breakfast. Took one bite of eggs then softly said,"No thank you" and pushed food away when I attempted to feed patient. Pt also would not drink. Offered coffee, milk and OJ, patient also refused.

## 2014-12-23 NOTE — ED Notes (Signed)
Dr Rito Ehrlichkrishnan paged about patient

## 2014-12-23 NOTE — ED Provider Notes (Signed)
Discussed patient with consulting hospitalist, Dr. Rito EhrlichKrishnan. Patient has been medically cleared by him, he feels is mostly a psychiatric issue causing her tachycardia and recommend starting on benzodiazepines which he has written for. Heart rate is still elevated although she is still in no acute distress. The patient does not need to be admitted per his consultation, but he states he will reconsult if heart rate remains elevated after Xanax and he may prescribe a beta blocker. Care transferred oncoming covering physician.  Pricilla LovelessScott Hymen Arnett, MD 12/23/14 [phone]

## 2014-12-23 NOTE — ED Notes (Signed)
Spoke to dr Criss Alvinegoldston about pt continued hr 120-140. He advises he has had medicine see and evaluate her and they advised him they are not admitting her and feel it is anxiety related. Verbalized concern for placement with continued elevated heart rate to charge nurse and dr Criss Alvinegoldston.

## 2014-12-23 NOTE — BHH Counselor (Signed)
TTS Counselor faxed referrals to the following facilities in effort to obtain inpt placement:  [facility]

## 2014-12-23 NOTE — ED Provider Notes (Signed)
Called to room as patient was tachycardic She does not speak english and can not give any history She is tachycardic but no hypoxia, no distress noted Apparently pt is not taking PO while awaiting placement No known substance abuse Will order labs/imaging Signed out to morning team to f/u on labs/imaging  BP 128/95 mmHg  Pulse 130  Temp(Src) 98 F (36.7 C) (Oral)  Resp 16  Wt 139 lb 1.6 oz (63.095 kg)  SpO2 100%  LMP 11/12/2014 (Approximate)    EKG Interpretation  Date/Time:  Monday Dec 23 2014 06:37:48 EDT Ventricular Rate:  128 PR Interval:  182 QRS Duration: 130 QT Interval:  393 QTC Calculation: 574 R Axis:   85 Text Interpretation:  Sinus or ectopic atrial tachycardia Nonspecific intraventricular conduction delay Repol abnrm suggests ischemia, diffuse leads artifact noted Confirmed by Bebe ShaggyWICKLINE  MD, Jemarcus Dougal ([id number]) on 12/23/2014 6:56:22 AM        Zadie Rhineonald Matson Welch, MD 12/23/14 [phone]

## 2014-12-23 NOTE — Consult Note (Signed)
[facility] Medical Consultation  Rhona LeavensSerkalem Longnecker WJX:[id number] DOB: 10-12-1970 DOA: 12/22/2014 PCP: No primary care provider on file.   Requesting physician: Tiffany KocherScott Gholston, ER physician Date of consultation: 12/23/14 Reason for consultation: Tachycardia  Impression/Recommendations  Please note that patient's sinus tachycardia is felt to be secondary to her ongoing anxiety and psychosis. She has been medically ruled out for underlying etiology as described below. She is felt to be medically stable for inpatient psychiatric admission when bed available.  Principal Problem:   Sinus Tachycardia: Infection, pain, hyperthyroidism, hypoxia and withdrawal all ruled out. Given that she was borderline tachycardic when she first presented to the emergency room, this is from her agitation, anxiety and psychosis. May be a small component of dehydration given her refusal to eat. Have started her on scheduled Xanax and if her heart rate still remains elevated by tomorrow, would start low-dose beta blocker. Active Problems:   MDD (major depressive disorder), single episode, severe with psychotic features   Abdominal pain: Vague. Abdominal x-ray unrevealing.   Myoglobinuria: Patient's urine noted to have large blood, but only 3-6 red cells. CK checked and found to be unremarkable.    Chief Complaint: Tachycardia  HPI:  Patient is a 44 year old [place] female who speaks minimal if any English and mostly [place] and was brought in by her daughter on 5/1 for decreased level of responsiveness, refusal to eat. Patient's husband had recently left the family a few days prior. Lab work on admission is unremarkable and patient was felt to have acute severe depression/psychoses. She initially was holding in a bed for psychiatric placement.  Patient was noted on the morning of 5/2 to be tachycardic with a heart rate in the 130s. EKG noted sinus tachycardia. Lab work was done which was unrevealing. Patient was  given a liter of IV fluids without any change. Hospitalists were called for further evaluation. A TSH, d-dimer both were normal. Urinalysis noted large hemoglobin but only 3-6 red cells so CK checked and found to be normal. Patient during exam seem to be complaining (through translator) of some generalized abdominal discomfort, but x-ray unrevealing as well as.  Review of Systems:  Difficult to get review of systems from patient, even with medical translator as she is quite withdrawn and only answers questions with yes or no answers occasionally when prompted. When asked repeatedly, she admits to abdominal discomfort. Denies any shortness of breath. Denies any chest pain.  Past Medical History  Diagnosis Date  . Schizophrenia   . Depression    History reviewed. No pertinent past surgical history. Social History:  reports that she has never smoked. She does not have any smokeless tobacco history on file. She reports that she does not drink alcohol or use illicit drugs. lives at home with her daughters  No Known Allergies Family history: When asked, patient does not answer this  Prior to Admission medications   Medication Sig Start Date End Date Taking? Authorizing Provider  diclofenac (VOLTAREN) 50 MG EC tablet Take 1 tablet (50 mg total) by mouth 2 (two) times daily as needed. 11/03/14  Yes Rodolph BongEvan S Corey, MD  gabapentin (NEURONTIN) 300 MG capsule Take 1 capsule (300 mg total) by mouth at bedtime. 11/03/14  Yes Rodolph BongEvan S Corey, MD  ibuprofen (ADVIL,MOTRIN) 200 MG tablet Take 200 mg by mouth every 6 (six) hours as needed.   Yes Historical Provider, MD  Multiple Vitamin (MULTIVITAMIN WITH MINERALS) TABS tablet Take 1 tablet by mouth daily.   Yes Historical Provider, MD  Physical Exam: Blood pressure 130/86, pulse 122, temperature 98.6 F (37 C), temperature source Oral, resp. rate 14, weight 63.095 kg (139 lb 1.6 oz), last menstrual period 12/23/2014, SpO2 99 %. Filed Vitals:   12/23/14 1400  BP:  130/86  Pulse: 122  Temp:   Resp: 14     General:  Withdrawn, flat affect  Eyes: Sclera nonicteric  ENT: Normocephalic and atraumatic mucous membranes are slightly dry  Neck: No JVD  Cardiovascular: Regular rhythm, tachycardic  Respiratory: Clear to auscultation bilaterally  Abdomen: Soft,? Nontender, nondistended, normoactive bowel sounds  Skin: No skin breaks, tears or lesions  Musculoskeletal: No clubbing or cyanosis or edema  Psychiatric: Patient is withdrawn, severe depression  Neurologic: No overt deficits  Labs on Admission:  Basic Metabolic Panel:  Recent Labs Lab 12/22/14 0225 12/23/14 0700  NA 139 140  K 3.9 3.5  CL 105 105  CO2 23 24  GLUCOSE 116* 95  BUN 7 5*  CREATININE 0.70 0.63  CALCIUM 9.1 9.3   Liver Function Tests:  Recent Labs Lab 12/22/14 0225  AST 14*  ALT 13*  ALKPHOS 44  BILITOT 0.7  PROT 6.9  ALBUMIN 3.8   No results for input(s): LIPASE, AMYLASE in the last 168 hours. No results for input(s): AMMONIA in the last 168 hours. CBC:  Recent Labs Lab 12/22/14 0225 12/23/14 0700  WBC 6.5 8.6  NEUTROABS 4.5 7.0  HGB 13.0 14.5  HCT 37.3 41.8  MCV 84.0 84.3  PLT 245 231   Cardiac Enzymes:  Recent Labs Lab 12/23/14 0943  CKTOTAL 78   BNP: Invalid input(s): POCBNP CBG: No results for input(s): GLUCAP in the last 168 hours.  Radiological Exams on Admission: Dg Chest Portable 1 View  12/23/2014   CLINICAL DATA:  44 year old female with insomnia and tachycardia. Current history of schizophrenia. Initial encounter.  EXAM: PORTABLE CHEST - 1 VIEW  COMPARISON:  None.  FINDINGS: Portable AP semi upright view at 0702 hrs. Normal cardiac size and mediastinal contours. Allowing for portable technique, the lungs are clear. No pneumothorax or pleural effusion. No acute osseous abnormality identified.  IMPRESSION: Negative portable chest, no acute cardiopulmonary abnormality.   Electronically Signed   By: Odessa Fleming M.D.   On:  12/23/2014 07:20   Dg Abd Portable 1v  12/23/2014   CLINICAL DATA:  Abdominal pain for several days  EXAM: PORTABLE ABDOMEN - 1 VIEW  COMPARISON:  None.  FINDINGS: No bowel dilatation or air-fluid level suggesting obstruction. No free air. No abnormal calcifications.  IMPRESSION: Bowel gas pattern unremarkable.   Electronically Signed   By: Bretta Bang III M.D.   On: 12/23/2014 12:04    EKG: Independently reviewed. Sinus tachycardia  Time spent: 50 minutes  Hollice Espy [facility] Pager [phone]  If 7PM-7AM, please contact night-coverage [email or web address] Password [email or web address] 12/23/2014, 2:20 PM

## 2014-12-23 NOTE — ED Notes (Addendum)
The 2 cups of water that were left at the bedside are still full.

## 2014-12-23 NOTE — Progress Notes (Signed)
[facility] per Dr. Jennet MaduroPucilowska.  Call report to [phone].  The receiving facility is currently setting up an onsite translator to be available upon admission.  CSW spoke with Mia with the interpreter line [phone] who confirms a translator on 12/24/14@9 :30am, reference number is [id number]. The number to contact for scheduling and confirming the translator's arrival [phone].  CSW spoke with Catia to inform her of the updated disposition and the ability to transport.  Please contact transport to transfer the patient on 5/3 by 8am for a 9:30am arrival.      Maryelizabeth Rowanressa Taran Haynesworth, MSW, LCSW, LCAS-A Clinical Social Worker [phone]

## 2014-12-23 NOTE — Progress Notes (Addendum)
CSW followed up on inpatient placement efforts.  Wait listed and/or under review: [facility]- on waitlist per Carmin MuskratSkye [facility]- being reviewed per intake staff  At capacity or no appropriate beds 12/23/14: [facility]- per Wandra Mannanameka, at capacity except for 1 adult female bed 5/2 [facility]- per Dayton Bailiffarlene, gero and low acuity beds only [facility]-per Cassie, at capacity [facility]- per intake staff, at capacity [facility]- per Aurea GraffJoan, at capacity 5/2  [facility] per Mickeal SkinnerPhoebe, fax referral [facility]- per Elijah Birkom, fax referral  Voicemail: [facility]  Ilean SkillMeghan Shatonya Passon, MSW, LCSWA Clinical Social Work, Disposition  12/23/2014 [phone]  12:07- Faxed to [facility] upon receiving word of bed availability  Ilean SkillMeghan Ariatna Jester, MSW, [facility] Clinical Social Work, Disposition  12/23/2014 [phone]

## 2014-12-23 NOTE — ED Notes (Addendum)
Meal tray was removed from the room. Only a few small bites of food were taken, most of the food was untouched. All cups of liquid were full. I left two cups of water at the bedside.

## 2014-12-23 NOTE — Progress Notes (Signed)
[facility], pt will be reviewed.  Ilean SkillMeghan Tarik Teixeira, MSW, LCSWA Clinical Social Work, Disposition  12/23/2014 [phone]

## 2014-12-23 NOTE — Progress Notes (Signed)
[facility] intake called to state pt's referral is under review.  Devine Dant,Ilean Skill MSW, LCSWA Clinical Social Work, Disposition  12/23/2014 [phone]

## 2014-12-23 NOTE — ED Notes (Addendum)
Investment banker, corporateCalled magistrate and he did get papers

## 2014-12-23 NOTE — ED Notes (Signed)
Second IV site attempted x2, unsuccessful.

## 2014-12-23 NOTE — ED Notes (Addendum)
Admitting MD at bedside. Phone interpreter used.

## 2014-12-23 NOTE — ED Provider Notes (Signed)
Patient signed out to me. Patient here for psychiatric issues including schizophrenia. Has not been eating and drinking like normal and is still refusing any oral intake at this time. Noted to be tachycardic into the 130s, after 2 L of IV fluid she is still tachycardic to 120s. Appears to be a sinus tachycardia. She otherwise is calm and alert, no tremors were distress to suggest a specific withdrawal. No known alcohol abuse or benzo use. Discussed with hospitalist, Dr. Rito EhrlichKrishnan, who will evaluate, consult and manage patient for unexplained tachycardia. No respiratory complaints or hypoxia, will send ddimer for low risk PE workup for unexplained tachycardia.  Pricilla LovelessScott Adama Ivins, MD 12/23/14 [phone]

## 2014-12-23 NOTE — ED Notes (Signed)
Charge notified of pts vitals, charge notifying MD.

## 2014-12-23 NOTE — ED Notes (Signed)
Pt placed on 5-lead. 

## 2014-12-23 NOTE — ED Notes (Signed)
Patient family and church members at bedside. They are encouraging her to try to eat. They have a traditional ethiopian dish and are hand feeding her. Patient does not refuse but continues expressionless. She does not try to feed herself.

## 2014-12-23 NOTE — ED Notes (Signed)
Faxed ivc papers to [facility]

## 2014-12-23 NOTE — ED Provider Notes (Signed)
44 year old female with history of schizophrenia not taking her medications for the past year who has presented here with decreased responsiveness over the past several days after her husband left pretibial area she has been tachycardic here. She has had a EKG that shows sinus tachycardia and has had a internal medicine consult. They felt this is all secondary to her anxiety and psychosis. Psychiatry has also seen the patient and felt that she needs to be admitted to a psychiatric institution. I have been asked to sign commitment papers. Patient is receiving IV fluids here. Blood pressures have been maintained obtained. Results for orders placed or performed during the hospital encounter of 12/22/14  CBC WITH DIFFERENTIAL  Result Value Ref Range   WBC 6.5 4.0 - 10.5 K/uL   RBC 4.44 3.87 - 5.11 MIL/uL   Hemoglobin 13.0 12.0 - 15.0 g/dL   HCT 16.137.3 09.636.0 - 04.546.0 %   MCV 84.0 78.0 - 100.0 fL   MCH 29.3 26.0 - 34.0 pg   MCHC 34.9 30.0 - 36.0 g/dL   RDW 40.913.1 81.111.5 - 91.415.5 %   Platelets 245 150 - 400 K/uL   Neutrophils Relative % 70 43 - 77 %   Neutro Abs 4.5 1.7 - 7.7 K/uL   Lymphocytes Relative 23 12 - 46 %   Lymphs Abs 1.5 0.7 - 4.0 K/uL   Monocytes Relative 7 3 - 12 %   Monocytes Absolute 0.5 0.1 - 1.0 K/uL   Eosinophils Relative 0 0 - 5 %   Eosinophils Absolute 0.0 0.0 - 0.7 K/uL   Basophils Relative 0 0 - 1 %   Basophils Absolute 0.0 0.0 - 0.1 K/uL  Comprehensive metabolic panel  Result Value Ref Range   Sodium 139 135 - 145 mmol/L   Potassium 3.9 3.5 - 5.1 mmol/L   Chloride 105 101 - 111 mmol/L   CO2 23 22 - 32 mmol/L   Glucose, Bld 116 (H) 70 - 99 mg/dL   BUN 7 6 - 20 mg/dL   Creatinine, Ser 7.820.70 0.44 - 1.00 mg/dL   Calcium 9.1 8.9 - 95.610.3 mg/dL   Total Protein 6.9 6.5 - 8.1 g/dL   Albumin 3.8 3.5 - 5.0 g/dL   AST 14 (L) 15 - 41 U/L   ALT 13 (L) 14 - 54 U/L   Alkaline Phosphatase 44 38 - 126 U/L   Total Bilirubin 0.7 0.3 - 1.2 mg/dL   GFR calc non Af Amer >60 >60 mL/min   GFR calc  Af Amer >60 >60 mL/min   Anion gap 11 5 - 15  Drug screen panel, emergency  Result Value Ref Range   Opiates NONE DETECTED NONE DETECTED   Cocaine NONE DETECTED NONE DETECTED   Benzodiazepines NONE DETECTED NONE DETECTED   Amphetamines NONE DETECTED NONE DETECTED   Tetrahydrocannabinol NONE DETECTED NONE DETECTED   Barbiturates NONE DETECTED NONE DETECTED  Ethanol  Result Value Ref Range   Alcohol, Ethyl (B) <5 <5 mg/dL  Salicylate level  Result Value Ref Range   Salicylate Lvl <4.0 2.8 - 30.0 mg/dL  Acetaminophen level  Result Value Ref Range   Acetaminophen (Tylenol), Serum <10 (L) 10 - 30 ug/mL  Urinalysis, Routine w reflex microscopic  Result Value Ref Range   Color, Urine YELLOW YELLOW   APPearance HAZY (A) CLEAR   Specific Gravity, Urine 1.012 1.005 - 1.030   pH 6.5 5.0 - 8.0   Glucose, UA NEGATIVE NEGATIVE mg/dL   Hgb urine dipstick LARGE (A) NEGATIVE  Bilirubin Urine NEGATIVE NEGATIVE   Ketones, ur 40 (A) NEGATIVE mg/dL   Protein, ur NEGATIVE NEGATIVE mg/dL   Urobilinogen, UA 0.2 0.0 - 1.0 mg/dL   Nitrite NEGATIVE NEGATIVE   Leukocytes, UA NEGATIVE NEGATIVE  CBC with Differential  Result Value Ref Range   WBC 8.6 4.0 - 10.5 K/uL   RBC 4.96 3.87 - 5.11 MIL/uL   Hemoglobin 14.5 12.0 - 15.0 g/dL   HCT 16.1 09.6 - 04.5 %   MCV 84.3 78.0 - 100.0 fL   MCH 29.2 26.0 - 34.0 pg   MCHC 34.7 30.0 - 36.0 g/dL   RDW 40.9 81.1 - 91.4 %   Platelets 231 150 - 400 K/uL   Neutrophils Relative % 80 (H) 43 - 77 %   Neutro Abs 7.0 1.7 - 7.7 K/uL   Lymphocytes Relative 12 12 - 46 %   Lymphs Abs 1.0 0.7 - 4.0 K/uL   Monocytes Relative 7 3 - 12 %   Monocytes Absolute 0.6 0.1 - 1.0 K/uL   Eosinophils Relative 1 0 - 5 %   Eosinophils Absolute 0.1 0.0 - 0.7 K/uL   Basophils Relative 0 0 - 1 %   Basophils Absolute 0.0 0.0 - 0.1 K/uL  Basic metabolic panel  Result Value Ref Range   Sodium 140 135 - 145 mmol/L   Potassium 3.5 3.5 - 5.1 mmol/L   Chloride 105 101 - 111 mmol/L    CO2 24 22 - 32 mmol/L   Glucose, Bld 95 70 - 99 mg/dL   BUN 5 (L) 6 - 20 mg/dL   Creatinine, Ser 7.82 0.44 - 1.00 mg/dL   Calcium 9.3 8.9 - 95.6 mg/dL   GFR calc non Af Amer >60 >60 mL/min   GFR calc Af Amer >60 >60 mL/min   Anion gap 11 5 - 15  Urine microscopic-add on  Result Value Ref Range   Squamous Epithelial / LPF FEW (A) RARE   WBC, UA 3-6 <3 WBC/hpf   RBC / HPF 3-6 <3 RBC/hpf   Bacteria, UA MANY (A) RARE  D-dimer, quantitative  Result Value Ref Range   D-Dimer, Quant <0.27 0.00 - 0.48 ug/mL-FEU  TSH  Result Value Ref Range   TSH 0.653 0.350 - 4.500 uIU/mL  CK  Result Value Ref Range   Total CK 78 38 - 234 U/L  POC Urine Pregnancy, ED (do NOT order at [facility])  Result Value Ref Range   Preg Test, Ur NEGATIVE NEGATIVE   44 year old female 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 Not to be secondary to #1. 3 question of volume depletion patient has not been taking by mouth well over the past 3 days and is receiving IV fluid hydration here in emergency department.  7:09 PM Patient voices some yes no answers.  Moves all four extremities well.  Continues tachycardiac, lungs clear.  Abdomen soft and does not appear ttp.  Plan lopressor 5 mg iv.  Patient with some decrease hr after lopressor and remains unchanged otherwise.  Margarita Grizzle, MD 12/25/14 [phone]

## 2014-12-24 ENCOUNTER — Encounter: Payer: Self-pay | Admitting: *Deleted

## 2014-12-24 ENCOUNTER — Inpatient Hospital Stay
Admission: EM | Admit: 2014-12-24 | Discharge: 2015-01-01 | DRG: 885 | Disposition: A | Discharge status: Home / Self Care | Payer: 59 | Source: Intra-hospital | Attending: Psychiatry | Admitting: Psychiatry

## 2014-12-24 DIAGNOSIS — G4700 Insomnia, unspecified: Secondary | ICD-10-CM | POA: Diagnosis present

## 2014-12-24 DIAGNOSIS — E860 Dehydration: Secondary | ICD-10-CM | POA: Diagnosis present

## 2014-12-24 DIAGNOSIS — F419 Anxiety disorder, unspecified: Secondary | ICD-10-CM | POA: Diagnosis present

## 2014-12-24 DIAGNOSIS — Z9114 Patient's other noncompliance with medication regimen: Secondary | ICD-10-CM | POA: Diagnosis present

## 2014-12-24 DIAGNOSIS — R55 Syncope and collapse: Secondary | ICD-10-CM | POA: Diagnosis present

## 2014-12-24 DIAGNOSIS — F323 Major depressive disorder, single episode, severe with psychotic features: Secondary | ICD-10-CM | POA: Diagnosis present

## 2014-12-24 DIAGNOSIS — F209 Schizophrenia, unspecified: Secondary | ICD-10-CM | POA: Diagnosis present

## 2014-12-24 DIAGNOSIS — F22 Delusional disorders: Secondary | ICD-10-CM | POA: Diagnosis present

## 2014-12-24 DIAGNOSIS — J45909 Unspecified asthma, uncomplicated: Secondary | ICD-10-CM | POA: Diagnosis present

## 2014-12-24 DIAGNOSIS — F250 Schizoaffective disorder, bipolar type: Secondary | ICD-10-CM

## 2014-12-24 DIAGNOSIS — R000 Tachycardia, unspecified: Secondary | ICD-10-CM | POA: Diagnosis present

## 2014-12-24 DIAGNOSIS — I959 Hypotension, unspecified: Secondary | ICD-10-CM

## 2014-12-24 DIAGNOSIS — W19XXXA Unspecified fall, initial encounter: Secondary | ICD-10-CM

## 2014-12-24 MED ORDER — LORAZEPAM 2 MG PO TABS: 2.0000 mg | ORAL_TABLET | Freq: Four times a day (QID) | ORAL | Status: DC | PRN

## 2014-12-24 MED ORDER — METOPROLOL SUCCINATE ER 25 MG PO TB24
25.0000 mg | ORAL_TABLET | Freq: Every day | ORAL | Status: DC
  Administered 2014-12-24 – 2014-12-26 (×3): 25 mg via ORAL
  Filled 2014-12-24 (×2): qty 1

## 2014-12-24 MED ORDER — TRAZODONE HCL 100 MG PO TABS
100.0000 mg | ORAL_TABLET | Freq: Every evening | ORAL | Status: DC | PRN
  Administered 2014-12-24: 100 mg via ORAL
  Filled 2014-12-24: qty 1

## 2014-12-24 MED ORDER — METOPROLOL TARTRATE 1 MG/ML IV SOLN: 5.0000 mg | Freq: Once | INTRAVENOUS | Status: DC

## 2014-12-24 MED ORDER — ALUM & MAG HYDROXIDE-SIMETH 200-200-20 MG/5ML PO SUSP
30.0000 mL | ORAL | Status: DC | PRN
  Administered 2014-12-25: 30 mL via ORAL
  Filled 2014-12-24 (×2): qty 30

## 2014-12-24 MED ORDER — MAGNESIUM HYDROXIDE 400 MG/5ML PO SUSP: 30.0000 mL | Freq: Every day | ORAL | Status: DC | PRN

## 2014-12-24 MED ORDER — ACETAMINOPHEN 325 MG PO TABS: 650.0000 mg | ORAL_TABLET | Freq: Four times a day (QID) | ORAL | Status: DC | PRN

## 2014-12-24 MED ORDER — METOPROLOL SUCCINATE ER 25 MG PO TB24
25.0000 mg | ORAL_TABLET | Freq: Every day | ORAL | Status: DC
  Administered 2014-12-24: 25 mg via ORAL
  Filled 2014-12-24: qty 1

## 2014-12-24 MED ORDER — OLANZAPINE 5 MG PO TABS
15.0000 mg | ORAL_TABLET | Freq: Every day | ORAL | Status: DC
  Administered 2014-12-24 – 2014-12-31 (×8): 15 mg via ORAL
  Filled 2014-12-24 (×8): qty 1

## 2014-12-24 NOTE — [facility] Group Notes (Signed)
[facility] Group Notes:  (Nursing/MHT/Case Management/Adjunct)  Date:  12/24/2014  Time:  9:28 PM  Type of Therapy:  Group Therapy  Participation Level:  Did Not Attend  Participation Quality:  n/a  Affect:  n/a  Cognitive:  n/a  Insight:  None  Engagement in Group:  n/a  Modes of Intervention:  n/a  Summary of Progress/Problems:  Lorre MunroeClarence Avila [name] 12/24/2014, 9:28 PM

## 2014-12-24 NOTE — ED Notes (Signed)
[facility] HAS ARRIVED TO TRANSPORT. MD WANTS TO WAIT A MOMENT BEFORE SENDING HER TO GET INTERNAL MEDICINE TO ORDER A BETA BLOCKER.

## 2014-12-24 NOTE — ED Notes (Signed)
PATIENT FAMILY AT BEDSIDE, THEY ARE ENCOURAGING HER TO EAT

## 2014-12-24 NOTE — [facility] Suicide Risk Assessment (Signed)
[facility] Admission Suicide Risk Assessment   Nursing information obtained from:    Demographic factors:    African female, married, religious Current Mental Status:    depression and psychosis Loss Factors:    husband is staying out of the country.  Daughter accepted to [facility] Historical Factors:    2 or 3 prior psychiatric hospitalizations for psychosis and depression Risk Reduction Factors:    social support, stable housing Total Time spent with patient: 1 hour Principal Problem: Schizophrenia Diagnosis:   Patient Active Problem List   Diagnosis Date Noted  . Schizophrenia [F20.9] 12/24/2014  . Tachycardia [R00.0] 12/23/2014     Continued Clinical Symptoms:  Alcohol Use Disorder Identification Test Final Score (AUDIT): 0 The "Alcohol Use Disorders Identification Test", Guidelines for Use in Primary Care, Second Edition.  World Science writer [facility]). Score between 0-7:  no or low risk or alcohol related problems. Score between 8-15:  moderate risk of alcohol related problems. Score between 16-19:  high risk of alcohol related problems. Score 20 or above:  warrants further diagnostic evaluation for alcohol dependence and treatment.   CLINICAL FACTORS:   Depression:   Anhedonia Insomnia Severe  Psychosis-delusions   Musculoskeletal: Strength & Muscle Tone: within normal limits Gait & Station: normal Patient leans: N/A  Psychiatric Specialty Exam: Physical Exam  Review of Systems  HENT: Negative.   Eyes: Negative.   Respiratory: Negative.   Cardiovascular: Negative.   Gastrointestinal: Negative.   Musculoskeletal: Negative.   Skin: Negative.   Neurological: Negative.   Endo/Heme/Allergies: Negative.   Psychiatric/Behavioral: Negative for depression, suicidal ideas, hallucinations, memory loss and substance abuse. The patient is nervous/anxious and has insomnia.                                                            Blood pressure  134/90, pulse 124, temperature 98.9 F (37.2 C), temperature source Oral, resp. rate 20, height  (1.626 m), weight 60.328 kg (133 lb), last menstrual period 12/23/2014, SpO2 100 %.Body mass index is 22.82 kg/(m^2).  General Appearance: Fairly Groomed  Patent attorney::  Minimal  Speech:  Slow and low volume and tone  Volume:  Decreased  Mood:  Dysphoric  Affect:  Blunt  Thought Process:  vague  Orientation:  Full (Time, Place, and Person)  Thought Content:  Delusions  Suicidal Thoughts:  No  Homicidal Thoughts:  No  Memory:  Immediate;   Good Recent;   Good Remote;   Good  Judgement:  Impaired  Insight:  Lacking  Psychomotor Activity:  Decreased  Concentration:  Poor  Recall:  Good  Fund of Knowledge:Poor  Language: Good  Akathisia:  No  Handed:  Right  AIMS (if indicated):     Assets:  Social Support  ADL's:  Intact  Cognition: WNL  Sleep:       COGNITIVE FEATURES THAT CONTRIBUTE TO RISK:  Closed-mindedness    SUICIDE RISK:   Moderate:  Frequent suicidal ideation with limited intensity, and duration, some specificity in terms of plans, no associated intent, good self-control, limited dysphoria/symptomatology, some risk factors present, and identifiable protective factors, including available and accessible social support.  PLAN OF CARE: admit to [facility]   Medical Decision Making:  Established Problem, Worsening (2)  I certify that inpatient services furnished can reasonably be expected  to improve the patient's condition.   Jimmy FootmanHernandez-Gonzalez,  Estle Sabella 12/24/2014, 4:19 PM
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
## 2014-12-24 NOTE — ED Notes (Signed)
DR Manus GunningANCOUR IN TO SEE PT FOR EMTALA

## 2014-12-24 NOTE — ED Provider Notes (Signed)
Patient accepted to [facility] by Dr. Jennet MaduroPucilowska. She's had persistent tachycardia throughout her stay in the ED. This has been worked up and thought to be due to anxiety per medicine consult. D-dimer was negative. TSH was normal. She had some response to IV lopressor yesterday.  She denies chest pain or shortness of breath.  Discussed with Dr. Rito EhrlichKrishnan who saw patient yesterday. He believes she is to medically clear. He will start a beta blocker as scheduled. She is on scheduled benzodiazepines since yesterday. No known chronic alcohol or benzodiazepine use. Nothing to suggest specific withdrawal syndrome. Dr. Rito EhrlichKrishnan thinks tachycardia is secondary to anxiety and she can still be transferred and does not a medical admission.  Glynn OctaveStephen Marnae Madani, MD 12/24/14 [phone]

## 2014-12-24 NOTE — Progress Notes (Signed)
Admission note.  Patient is a 44y woman of [place] decent admitted under IVC to Dr. Hardie ShackletonHernandez's services. Patient does have some limits understanding and speaking [place].  Her native tongue is Amharic.  An interpretor was contacted at [phone] code [phone]#[id number] and used for her admission.  Apparently patient has three children. A 1017 y girl, a 538 year old boy and a 2564year old boy.   The 45 year old boy has been having some respiratory problems. This coupled with the fact her husband has taken an extended vacation back to [place] has caused the patient to decompensate.  Patient has apparently not been sleeping or eating for several days.  She has a hx of being diagnosed with schizophrenia but has been off medication for at least 7 months.She denies suicidal thoughts at this time.      Patient was searched upon admission and skin assessment performed by Edison SimonSusan Michels RN and GiGi RN.   Patient was oriented to unit and offered a meal tray.  Q3515min checks started and patient monitored for safety.   Upon admission she did not have any belongings with her.  Apparently these were sent home with her daughter from the [facility] at [facility].

## 2014-12-24 NOTE — Progress Notes (Signed)
CSW spoke with Mia with the interpreter line [phone] who confirms a translator on 12/24/14@9 :30am, reference number is [id number]. The number to contact for scheduling and confirming the translator's arrival [phone].  CSW spoke with [facility] with the interpreter line [phone] who reports the scheduled translator was not able to be onsite at 9am today.  CSW asked Rivka BarbaraGlenda to clarify the translator's availability for the duration of the patient's admission stay on the behavioral health unit.  She reports she will contact this Clinical research associatewriter with confirmation.    Maryelizabeth Rowanressa Citlali Gautney, MSW, LCSW, LCAS-A Clinical Social Worker [phone]

## 2014-12-24 NOTE — Progress Notes (Signed)
Spoke with emergency room. Patient still remaining tachycardic, she did respond well to IV Lopressor 1 dose earlier on.  As mentioned before, her tachycardia is a side effect of her anxiety. Medical issues have been ruled out. We'll go ahead and start her on an oral beta blocker Toprol-XL 25 mg by mouth daily.  Would recommend that she stay on this dose and as her psychosis/depression/anxiety is treated, she may eventually be able to be tapered off this medication, but for now would continue it. Please note that it would likely take 24-36 hours for full effect, before titrating upwards on this medication if needed.  Patient is cleared from a medical standpoint and felt to be stable for transfer to inpatient psychiatry facility.

## 2014-12-24 NOTE — Progress Notes (Signed)
Recreation Therapy Notes  Date: 05.03.16 Time: 3:00 pm Location: Craft Room  Group Topic: Self-esteem  Goal Area(s) Addresses:  Patient will identify positive attributes about self. Patient will identify at least one coping skill.  Behavioral Response: Did not attend  Intervention: All About Me  Activity: Patients were instructed to make an All About Me pamphlet including positive traits about themselves, healthy coping skills, and their healthy support system.  Education: LRT educated patient on self-esteem, coping skills, and healthy support systems  Education Outcome: Patient did not attend group.  Clinical Observations/Feedback: Patient did not attend group.     Jacquelynn CreeGreene,Ziare Orrick M, LRT/CTRS 12/24/2014 4:22 PM

## 2014-12-24 NOTE — [facility] Group Notes (Signed)
[facility] Group Notes:  (Nursing/MHT/Case Management/Adjunct)  Date:  12/24/2014  Time:  12:06 PM  Type of Therapy:  Group Therapy  Participation Level:  Did Not Attend  Participation Quality:  Resistant  Affect:  Resistant  Cognitive:  Lacking  Insight:  None  Engagement in Group:  Resistant  Modes of Intervention:  Education  Summary of Progress/Problems:  [name] 12/24/2014, 12:06 PM

## 2014-12-25 ENCOUNTER — Encounter: Payer: Self-pay | Admitting: *Deleted

## 2014-12-25 LAB — LIPID PANEL
CHOL/HDL RATIO: 3 ratio
Cholesterol: 136 mg/dL (ref 0–200)
HDL: 45 mg/dL (ref >40)
LDL Cholesterol: 78 mg/dL (ref 0–99)
Triglycerides: 65 mg/dL (ref <150)
VLDL: 13 mg/dL (ref 0–40)

## 2014-12-25 LAB — HEMOGLOBIN A1C: Hgb A1c MFr Bld: 5.2 % (ref 4.0–6.0)

## 2014-12-25 NOTE — [facility] Group Notes (Signed)
[facility]  12/25/2014 11:08 AM  Participation Quality:  Did not attend  Affect:  n/a  Cognitive:  n/a  Insight:  n/a  Engagement in Group:  n/a  Modes of Intervention:  n/a  Summary of Progress/Problems: Pt did not attend group.  Beryl MeagerIngle, Danae Oland T 12/25/2014, 11:08 AM

## 2014-12-25 NOTE — [facility] Group Notes (Signed)
Adult Psychoeducational Group Note  Date:  12/25/2014 Time:  2:31 PM  Group Topic/Focus:  Emotional Education:   The focus of this group is to discuss what feelings/emotions are, and how they are experienced.  Participation Level:  Did Not Attend  Participation Quality:  n/a  Affect:  n/a  Cognitive:  n/a  Insight: n/a  Engagement in Group:  n/a  Modes of Intervention:  n/a  Additional Comments:  Pt did not attend group.   Beryl MeagerIngle, Quentyn Kolbeck T 12/25/2014, 2:31 PM

## 2014-12-25 NOTE — Plan of Care (Signed)
Problem: Ineffective individual coping Goal: LTG-Other (Specify)- Outcome: Not Progressing Unable to speak english will need interpretor or family for effective communication.

## 2014-12-25 NOTE — Progress Notes (Signed)
Alyssa Avila very sad,flat and depressed.Denies SI/HI/A/V/H.Spoke with daughter she was able to give a lot of information. About [name].She had stopped her meds 1 year unable to afford meds..6 years ago she had another psych admission and was on meds.She works at [employer].She very concerned about son who had breathing problems and had to go ED.Daughter reassured her mom numerous times her son was fine.Also neighbor is checking on family.Daughter states her mom had become increasing depressed,not eating or sleeping.Marland Kitchen.Her husband was gone to [place] but plan is to return at end of week.Med compliant with encouragement.

## 2014-12-25 NOTE — Progress Notes (Signed)
Recreation Therapy Notes  Recreation Therapy Group Note Template    Date: [id number] Time: 3:00 pm Location: Craft Room  Group Topic: Coping Skills  Goal Area(s) Addresses:  Patient will identify things they are grateful for. Patient will identify how being grateful can influence decision making.  Behavioral Response: Did not attend  Intervention: Grateful Wheel  Activity: Patients were given an "I Am Grateful For" worksheet and instructed to list 2-3 things per each category that they are grateful for.  Education: LRT educated patients on leisure and why it is important to implement it into their schedules.  Education Outcome: Patient did not attend group.  Clinical Observations/Feedback: Patient did not attend group.   Jacquelynn CreeGreene,Saahas Hidrogo M, LRT/CTRS 12/25/2014 5:19 PM

## 2014-12-25 NOTE — Progress Notes (Signed)
[facility]  12/25/2014 1:53 PM Rhona LeavensSerkalem Kerwood  MRN:  [id number] Subjective:  Patient was seen today. Interpreter was used. Patient reports not sleeping well last night and feeling tired this morning. The patient denies depressed mood, denies having suicidal thoughts homicidal thoughts or auditory or visual hallucinations. Appetite continues to be poor energy continues to be poor.  As far as side effects complains of sedation.  Denies having any physical complaints today.  Patient has been withdrawn to her room. Patient says just today her family came to visit her.  He states her son has been doing better, she is not longer worried that he might die.  Principal Problem: Schizophrenia Diagnosis:   Patient Active Problem List   Diagnosis Date Noted  . Schizophrenia [F20.9] 12/24/2014  . Tachycardia [R00.0] 12/23/2014   Total Time spent with patient: 30 minutes   Past Medical History:  Past Medical History  Diagnosis Date  . Schizophrenia   . Depression    History reviewed. No pertinent past surgical history. Family History: History reviewed. No pertinent family history. Social History:  History  Alcohol Use No     History  Drug Use No    History   Social History  . Marital Status: Married    Spouse Name: N/A  . Number of Children: N/A  . Years of Education: N/A   Social History Main Topics  . Smoking status: Never Smoker   . Smokeless tobacco: Not on file  . Alcohol Use: No  . Drug Use: No  . Sexual Activity: Not on file   Other Topics Concern  . None   Social History Narrative   Additional History:    Sleep: Poor  Appetite:  Poor   Assessment:   Musculoskeletal: Strength & Muscle Tone: within normal limits Gait & Station: normal Patient leans: N/A   Psychiatric Specialty Exam: Physical Exam  Review of Systems  HENT: Negative.   Respiratory: Negative.   Cardiovascular: Negative.   Gastrointestinal: Negative.   Musculoskeletal: Negative.    Neurological: Negative.   Psychiatric/Behavioral: Negative.     Blood pressure 123/86, pulse 118, temperature 98.6 F (37 C), temperature source Oral, resp. rate 20, height 5\' 4"  (1.626 m), weight 60.328 kg (133 lb), last menstrual period 12/23/2014, SpO2 100 %.Body mass index is 22.82 kg/(m^2).  General Appearance: Disheveled  Eye Contact::  Poor  Speech:  Slow and Low tone and volume  Volume:  Decreased  Mood:  Dysphoric  Affect:  Blunt  Thought Process:  Vague and concrete  Orientation:  Full (Time, Place, and Person)  Thought Content:  Hallucinations: None  Suicidal Thoughts:  No  Homicidal Thoughts:  No  Memory:  Immediate;   Good Recent;   Good Remote;   Good  Judgement:  Impaired  Insight:  Lacking  Psychomotor Activity:  Decreased  Concentration:  Fair  Recall:  Good  Fund of Knowledge:Fair  Language: Good  Akathisia:  No  Handed:  Right  AIMS (if indicated):     Assets:  Financial Resources/Insurance Housing Social Support  ADL's:  Intact  Cognition: WNL  Sleep:  Number of Hours: 6.5     Current Medications: Current Facility-Administered Medications  Medication Dose Route Frequency Provider Last Rate Last Dose  . acetaminophen (TYLENOL) tablet 650 mg  650 mg Oral Q6H PRN Jimmy FootmanAndrea Hernandez-Gonzalez, MD      . alum & mag hydroxide-simeth (MAALOX/MYLANTA) 200-200-20 MG/5ML suspension 30 mL  30 mL Oral Q4H PRN Jimmy FootmanAndrea Hernandez-Gonzalez, MD      .  LORazepam (ATIVAN) tablet 2 mg  2 mg Oral Q6H PRN Jimmy Footman, MD      . magnesium hydroxide (MILK OF MAGNESIA) suspension 30 mL  30 mL Oral Daily PRN Jimmy Footman, MD      . metoprolol succinate (TOPROL-XL) 24 hr tablet 25 mg  25 mg Oral Daily Jimmy Footman, MD   25 mg at 12/25/14 0935  . OLANZapine (ZYPREXA) tablet 15 mg  15 mg Oral QHS Jimmy Footman, MD   15 mg at 12/24/14 2157  . traZODone (DESYREL) tablet 100 mg  100 mg Oral QHS PRN Jimmy Footman, MD    100 mg at 12/24/14 2157    Lab Results:  Results for orders placed or performed during the hospital encounter of 12/24/14 (from the past 48 hour(s))  Lipid panel, fasting     Status: None   Collection Time: 12/25/14  6:53 AM  Result Value Ref Range   Cholesterol 136 0 - 200 mg/dL   Triglycerides 65 <130 mg/dL   HDL 45 >86 mg/dL   Total CHOL/HDL Ratio 3.0 RATIO   VLDL 13 0 - 40 mg/dL   LDL Cholesterol 78 0 - 99 mg/dL    Comment:        Total Cholesterol/HDL:CHD Risk Coronary Heart Disease Risk Table                     Men   Women  1/2 Average Risk   3.4   3.3  Average Risk       5.0   4.4  2 X Average Risk   9.6   7.1  3 X Average Risk  23.4   11.0        Use the calculated Patient Ratio above and the CHD Risk Table to determine the patient's CHD Risk.        ATP III CLASSIFICATION (LDL):  <100     mg/dL   Optimal  578-469  mg/dL   Near or Above                    Optimal  130-159  mg/dL   Borderline  629-528  mg/dL   High  >413     mg/dL   Very High     Physical Findings: AIMS: Facial and Oral Movements Muscles of Facial Expression: None, normal Lips and Perioral Area: None, normal Jaw: None, normal Tongue: None, normal,Extremity Movements Upper (arms, wrists, hands, fingers): None, normal Lower (legs, knees, ankles, toes): None, normal, Trunk Movements Neck, shoulders, hips: None, normal, Overall Severity Severity of abnormal movements (highest score from questions above): None, normal Incapacitation due to abnormal movements: None, normal Patient's awareness of abnormal movements (rate only patient's report): No Awareness, Dental Status Current problems with teeth and/or dentures?: No Does patient usually wear dentures?: No  CIWA:    COWS:     Treatment Plan Summary: Daily contact with patient to assess and evaluate symptoms and progress in treatment and Medication management  Schizophrenia continue olanzapine 15 mg by mouth daily at bedtime   Insomnia  continue trazodone 100 mg by mouth daily at bedtime as needed  Tachycardia continue metoprolol 25 mg by mouth daily. Heart rate this morning 118.  May consider increasing metoprolol to 58 heart rate continues to be above 100  Anxiety continue Ativan 2 mg by mouth every 6 hours when necessary  Collateral information: Yesterday we were unable to contact any family will attend again today to contact the patient's  daughter in family friend.  Per patient's husband is out of the country for 1 month.  Hospitalization status : Continue IVC   Labs: Lipid panel is within the normal limits. Hemoglobin A1c is pending.  TSH within the normal limits.  Medical Decision Making:  Established Problem, Stable/Improving (1)     Jimmy FootmanHernandez-Gonzalez,  Dorothy Landgrebe 12/25/2014, 1:53 PM

## 2014-12-25 NOTE — Progress Notes (Signed)
Patient ID: Alyssa Avila, female   DOB: 18-Apr-1971, 44 y.o.   MRN: [id number] Patient very quiet and isolative to her room. She does come out with encouragement.  Took her morning blood pressure medicine but needed reassurance that it was for blood pressure. Patient stated "blood pressure" a few times then took the pill. She denies SI. Affect flat but peers praised her for coming to med room, and she smiled. Will continue to monitor.

## 2014-12-25 NOTE — [facility] Group Notes (Signed)
[facility] Group Notes:  (Nursing/MHT/Case Management/Adjunct)  Date:  12/25/2014  Time:  12:55 PM  Type of Therapy:  Group Therapy  Participation Level:  Did Not Attend    Alyssa Avila 12/25/2014, 12:55 PM

## 2014-12-25 NOTE — [facility] Group Notes (Signed)
[facility] Group Notes:  (Nursing/MHT/Case Management/Adjunct)  Date:  12/25/2014  Time:  9:20 PM  Type of Therapy:  Group Therapy  Participation Level:  Did Not Attend  Participation Quality:  n/a  Affect:  n/a  Cognitive:  n/a  Insight:  None  Engagement in Group:  n/a  Modes of Intervention:  n/a  Summary of Progress/Problems:  Alyssa MunroeClarence Melvin Valecia Avila 12/25/2014, 9:20 PM

## 2014-12-25 NOTE — Plan of Care (Signed)
Problem: Alteration in mood Goal: LTG-Patient reports reduction in suicidal thoughts (Patient reports reduction in suicidal thoughts and is able to verbalize a safety plan for whenever patient is feeling suicidal)  Outcome: Progressing Denies SI. Due to language barrier not able to verbalize safety plan at this time. Goal: LTG-Pt's behavior demonstrates decreased signs of depression (Patient's behavior demonstrates decreased signs of depression to the point the patient is safe to return home and continue treatment in an outpatient setting)  Outcome: Progressing Patient showed slight smile this morning. Cooperative with meds.

## 2014-12-26 LAB — VITAMIN B12: Vitamin B-12: 560 pg/mL (ref 180–914)

## 2014-12-26 MED ORDER — GI COCKTAIL ~~LOC~~
30.0000 mL | Freq: Two times a day (BID) | ORAL | Status: DC | PRN
  Administered 2014-12-26: 30 mL via ORAL
  Filled 2014-12-26: qty 30

## 2014-12-26 MED ORDER — METOPROLOL SUCCINATE ER 25 MG PO TB24
25.0000 mg | ORAL_TABLET | Freq: Once | ORAL | Status: DC
Start: 2014-12-26 — End: 2014-12-27
  Filled 2014-12-26: qty 1

## 2014-12-26 MED ORDER — METOPROLOL SUCCINATE ER 50 MG PO TB24
50.0000 mg | ORAL_TABLET | Freq: Every day | ORAL | Status: DC
  Administered 2014-12-27: 50 mg via ORAL
  Filled 2014-12-26 (×2): qty 1

## 2014-12-26 MED ORDER — ESCITALOPRAM OXALATE 10 MG PO TABS
20.0000 mg | ORAL_TABLET | Freq: Every day | ORAL | Status: DC
  Administered 2014-12-27 – 2015-01-01 (×6): 20 mg via ORAL
  Filled 2014-12-26 (×7): qty 2

## 2014-12-26 MED ORDER — ENSURE ENLIVE PO LIQD
237.0000 mL | Freq: Three times a day (TID) | ORAL | Status: DC
  Administered 2014-12-26 – 2014-12-27 (×3): 237 mL via ORAL

## 2014-12-26 NOTE — [facility] Group Notes (Signed)
[facility] Group Notes:  (Nursing/MHT/Case Management/Adjunct)  Date:  12/26/2014  Time:  11:52 AM  Type of Therapy:  Group Therapy  Participation Level:  Did Not Attend    Alyssa Avila 12/26/2014, 11:52 AM

## 2014-12-26 NOTE — [facility] Group Notes (Signed)
[facility] Group Notes:  (Nursing/MHT/Case Management/Adjunct)  Date:  12/26/2014  Time:  9:12 AM  Type of Therapy:  Community Meeting   Participation Level:  Did Not Attend  Participation Quality:  None  Affect:  None  Cognitive:  None  Insight:  None  Engagement in Group:  None  Modes of Intervention:  None  Summary of Progress/Problems:  Rob Mciver De'Chelle Avalin Briley 12/26/2014, 9:12 AM

## 2014-12-26 NOTE — Progress Notes (Signed)
Pt is paranoid and suspicous. Disorganized and confused thought process, unable to follow simple directions. Frequent redirection required. sad and flat affect. speech soft and low with thought blocking noted. C/o upset stomach. Maalox 30 ml given PRN.  Isolates in room. Visited by family and friends. Med compliant. No voiced thought of hurting herself. Denies depression, anxiety and feelings of hopelessness. q 15 min checks maintained for safety. Will continue to monitor behavior.

## 2014-12-26 NOTE — Tx Team (Signed)
Interdisciplinary Treatment Plan Update (Adult)  Date:  12/26/2014 Time Reviewed:  9:14 AM  Progress in Treatment: Attending groups: No. Participating in groups:  No. Taking medication as prescribed:  Yes. Tolerating medication:  Yes. Family/Significant othe contact made:  Yes, individual(s) contacted:  Daughter was called but only came in for family visit last night. Patient understands diagnosis:  Yes. Discussing patient identified problems/goals with staff:  Yes. Medical problems stabilized or resolved:  Yes. Denies suicidal/homicidal ideation: Yes. and No. Issues/concerns per patient self-inventory:  Yes. Other:  New problem(s) identified: Yes, Describe:  Language barrier  Discharge Plan or Barriers: [employer](does not meet criteria for [employer]-has insurance, will attempt to find psychiatrist  Reason for Continuation of Hospitalization: Depression, psychosis,poor appetite, started on zyprexa-awaiting stabilization, will consider long acting injectable  Estimated length of stay:7 days  New goal(s):TBD  Review of initial/current patient goals per problem list:   See Plan of care Attendees: Patient:  Alyssa Avila 5/5/20169:14 AM  Family:   5/5/20169:14 AM  Physician:  Dr Ardyth HarpsHernandez 5/5/20169:14 AM  Nursing:   Toniann FailWendy 5/5/20169:14 AM  Case Manager:   5/5/20169:14 AM  Counselor:   5/5/20169:14 AM  Other:   Other: Arrie Senatelaudine Doyt Castellana LCSW 5/5/20167:57 AM  Other: Beryl MeagerJason Ingle LCSWA 5/5/20167:57 AM  Other: Jake SharkSara Laws LCSW 5/5/20167:57 AM  Other:  5/5/20167:57 AM  Other: Quintella BatonElizabeth GreeneLRT 5/5/20167:57 AM   5/5/20167:57 AM   5/5/20167:57 AM   5/5/20167:57 AM        5/5/20169:14 AM  Other:   5/5/20169:14 AM  Other:   5/5/20169:14 AM  Other:  5/5/20169:14 AM  Other:  5/5/20169:14 AM  Other:  5/5/20169:14 AM  Other:  5/5/20169:14 AM  Other:  5/5/20169:14 AM  Other:  5/5/20169:14 AM  Other:   5/5/20169:14 AM   Scribe for Treatment Team:   Cheron SchaumannBandi,  Saadia Dewitt M, 12/26/2014, 9:14 AM

## 2014-12-26 NOTE — Tx Team (Signed)
Interdisciplinary Treatment Plan Update (Adult)  Date:  12/26/2014 Time Reviewed:  8:11 AM  Progress in Treatment: Attending groups: No. Participating in groups:  No. Taking medication as prescribed:  Yes. Tolerating medication:  Yes. Family/Significant othe contact made:  Yes, individual(s) contacted:  Daughter was called but only came in for family visit last night. Patient understands diagnosis:  Yes. Discussing patient identified problems/goals with staff:  Yes. Medical problems stabilized or resolved:  Yes. Denies suicidal/homicidal ideation: Yes. and No. Issues/concerns per patient self-inventory:  Yes. Other:  New problem(s) identified: Yes, Describe:  Language barrier  Discharge Plan or Barriers:  Reason for Continuation of Hospitalization: Depression  Comments:  Estimated length of stay:  New goal(s):TBD  Review of initial/current patient goals per problem list:   TBD  Attendees: Patient:  Alyssa Avila 5/5/20168:11 AM  Family:   5/5/20168:11 AM  Physician:  Dr Ardyth HarpsHernandez 5/5/20168:11 AM  Nursing:    5/5/20168:11 AM  Case Manager:   5/5/20168:11 AM  Counselor:   5/5/20168:11 AM  Other:   Other: Arrie Senatelaudine Carmella Kees LCSW 5/5/20167:57 AM  Other: Beryl MeagerJason Ingle LCSWA 5/5/20167:57 AM  Other: Jake SharkSara Laws LCSW 5/5/20167:57 AM  Other: Delorse LekLynn Washington LCSW 5/5/20167:57 AM  Other: Princella IonElizabeth Greene 5/5/20167:57 AM  Other: Sheppard PentonLee Ann Rousch 5/5/20167:57 AM  Other: Susy FrizzleMatt [employer] 5/5/20167:57 AM  Other: Unk PintoHarvey Bryant -Peer Support 5/5/20167:57 AM        5/5/20168:11 AM  Other:   5/5/20168:11 AM  Other:   5/5/20168:11 AM  Other:  5/5/20168:11 AM  Other:  5/5/20168:11 AM  Other:  5/5/20168:11 AM  Other:  5/5/20168:11 AM  Other:  5/5/20168:11 AM  Other:  5/5/20168:11 AM  Other:   5/5/20168:11 AM   Scribe for Treatment Team:   Cheron SchaumannBandi, Angeliah Wisdom M, 12/26/2014, 8:11 AM

## 2014-12-26 NOTE — Plan of Care (Signed)
Problem: Communication/Thought Process Goal: Ability to express needs and understand communication Outcome: Progressing While patient has a language barrier, she seems to be more reluctant to ask staff for what she needs. For instance, staff explained to patient that she would get a menu to complete each day. These are given out at meal times, but patient has not gone to meals. She had a friend call and relate that she had not received a menu, so one was given and discussed with her. She seems to be building more of a therapeutic relationship with staff but presently is guarded and reluctant to express her needs.  Problem: Alteration in mood Goal: STG-Patient is able to discuss feelings and issues (Patient is able to discuss feelings and issues leading to depression)  Outcome: Not Progressing Patient focused on being discharged and guarded emotionally.  Goal: STG-Patient reports thoughts of self-harm to staff Outcome: Progressing Patient is denying SI and has exhibited no evidence of self-harm. Goal: STG-Pt Able to Identify Plan For Continuing Care at D/C Pt. Will be able to identify a plan for continuing care at discharge  Outcome: Progressing Patient is cooperating with medications and attempting to eat more. She is working at getting better so she can be discharged.

## 2014-12-26 NOTE — Progress Notes (Signed)
CSW received a call from Alyssa Avila [phone] with the interpreter service who is reporting she sent the current onsite translator home because we do not have a contract with them.  She provided this Clinical research associatewriter with her supervisor's contact information Alyssa Avila [phone].  During our conversation it was discrepancy about the normal duration for a patient needing interpreter services.  CSW informed Alyssa Avila the importance of this patient having onsite services in the therapeutic treatment environment the difference as it may compare with a medical patient on an inpatient unit.  She instructed this Clinical research associatewriter to speak with her supervisor.    CSW spoke with Alyssa Avila and Alyssa Avila [phone] with the language line services as they wanted confirmation if the current translator is need or not.  Based on the conversation with Alyssa Avila at the perception this agency does not have a contract [facility] the scheduled translator was canceled.  She reports that the service provider would still need to be paid for today and tomorrow.  CSW informed her that the unit Director will be informed.    CSW spoke with Alyssa Avila [phone] about the need for interpreter onsite services.  She confirmed that the agency that sent the translator today is under a contract with [facility] and will be compensated.  She will work with her staff to locate a Sales executivelocal translator for the patient and it that does not happen they will call the translator that came today back.  She instructed this Clinical research associatewriter to complete an online request for services and they will take the responsibility.    CSW completed an interpreter request for today until Tuesday 12/31/2014.    Alyssa Avila, MSW, LCSW, LCAS-A Clinical Social Worker [phone]

## 2014-12-26 NOTE — Progress Notes (Signed)
Recreation Therapy Notes  Date: 05.05.16 Time: 3:00 pm Location: Craft Room  Group Topic: Leisure Education  Goal Area(s) Addresses:  Increase leisure awareness.  Behavioral Response: Did not attend  Intervention: Leisure Time  Activity: Patients were instructed to write two healthy leisure activities on slips of paper. Patients were then given a Leisure Time Clock worksheet and instructed to fill out the worksheet. Patients were instructed to list 10 positive emotions. Patient matched their leisure activities to the positive emotions.  Education:Patient did not attend group.  Education Outcome: Patient did not attend group.  Clinical Observations/Feedback: Patient did not attend group.    Willadeen Colantuono M, LRT/CTRS 12/26/2014 5:17 PM 

## 2014-12-26 NOTE — Plan of Care (Signed)
Problem: Ineffective individual coping Goal: STG: Patient will remain free from self harm Outcome: Not Met (add Reason) No injuries noted. q 15 min checks maintained for safety. No voiced thoughts of hurting herself.

## 2014-12-26 NOTE — Progress Notes (Signed)
Recreation Therapy Notes  INPATIENT RECREATION THERAPY ASSESSMENT  Patient Details Name: Alyssa Avila MRN: [id number] DOB: [birth year] Today's Date: 12/26/2014  Patient Stressors:  Patient reported no stressors  Coping Skills:   Art/Dance, Music  Personal Challenges:  Patient reported no personal challenges  Leisure Interests (2+):  Individual - Other (Comment) (Read and watch TV)  Awareness of Community Resources:  Yes  Community Resources:  Park  Current Use: No  If no, Barriers?:    Patient Strengths:  good mom  Patient Identified Areas of Improvement:  "i don't know"  Current Recreation Participation:  Reading  Patient Goal for Hospitalization:  "No"  [place]  [place] Residence[place]   Current SI (including self-harm):  No  Current HI:  No  Consent to Intern Participation: N/A  Due to patient reporting no personal challenges, LRT will not make a care plan for patient. LRT will check in with patient. If patient's status changes, LRT will develop a care plan.  Jacquelynn CreeElizabeth M Tayra Dawe, LRT/CTRS 12/26/2014, 6:10 PM

## 2014-12-26 NOTE — Progress Notes (Signed)
[facility] MD Progress Note  12/26/2014 1:45 PM Alyssa Avila  MRN:  [id number] Subjective:  Patient was seen today. Interpreter was used. Patient reports not sleeping well last night and feeling tired this morning. The patient denies depressed mood, denies having suicidal thoughts homicidal thoughts.  Today she reported having auditory hallucinations and feeling like something was agitating her at night. When asked to elaborate patient then stated that she was not hallucinating. Appetite continues to be poor energy continues to be poor.  As far as side effects complains of sedation.  Denies having any physical complaints today.  Patient has been withdrawn to her room.   During examination the patient appears to have severe dysphoric mood, poor eye contact, she appears  afraid and paranoid.  Per nursing staff she has not been eating or drinking much. She has been isolated to her room.  Today I was able to speak with the patient's daughter reports that the patient's son had 2 episodes of asthma last week and was rushed to the emergency room twice. After the second time the patient started reporting feeling guilty about going to work and leaving her son along.  The daughter reports that at that point is when the mother is started not sleeping and looking depressed.  Daughter denies that her mother was h hallucinating or voicing delusions. 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 The daughter states that for the last year the patient has been doing well without any medications.  Appears that the act team services were stopped after she is started working at [employer].    Principal Problem: Schizophrenia Diagnosis:   Patient Active Problem List   Diagnosis Date Noted  . Schizophrenia [F20.9] 12/24/2014  . Tachycardia [R00.0] 12/23/2014   Total Time spent with patient: 30 minutes   Past Medical History:  Past Medical History   Diagnosis Date  . Schizophrenia   . Depression    History reviewed. No pertinent past surgical history. Family History: History reviewed. No pertinent family history. Social History:  History  Alcohol Use No     History  Drug Use No    History   Social History  . Marital Status: Married    Spouse Name: N/A  . Number of Children: N/A  . Years of Education: N/A   Social History Main Topics  . Smoking status: Never Smoker   . Smokeless tobacco: Not on file  . Alcohol Use: No  . Drug Use: No  . Sexual Activity: Not on file   Other Topics Concern  . None   Social History Narrative   Additional History:    Sleep: Poor  Appetite:  Poor   Assessment:   Musculoskeletal: Strength & Muscle Tone: within normal limits Gait & Station: normal Patient leans: N/A   Psychiatric Specialty Exam: Physical Exam   Review of Systems  Respiratory: Positive for cough.   Cardiovascular: Negative for chest pain.  Gastrointestinal: Negative for nausea, vomiting, abdominal pain and diarrhea.  Neurological: Negative for headaches.  Psychiatric/Behavioral: Negative for depression, suicidal ideas, hallucinations and substance abuse. The patient has insomnia. The patient is not nervous/anxious.     Blood pressure 130/87, pulse 147, temperature 98.8 F (37.1 C), temperature source Oral, resp. rate 20, height  (1.626 m), weight 60.328 kg (133 lb), last menstrual period 12/23/2014, SpO2 100 %.Body mass index is 22.82 kg/(m^2).  General Appearance: Disheveled  Eye Contact::  Poor  Speech:  Slow and Low tone and volume  Volume:  Decreased  Mood:  Dysphoric  Affect:  Blunt  Thought Process:  Vague and concrete  Orientation:  Full (Time, Place, and Person)  Thought Content:  Hallucinations: None  Suicidal Thoughts:  No  Homicidal Thoughts:  No  Memory:  Immediate;   Good Recent;   Good Remote;   Good  Judgement:  Impaired  Insight:  Lacking  Psychomotor Activity:  Decreased   Concentration:  Fair  Recall:  Good  Fund of Knowledge:Fair  Language: Good  Akathisia:  No  Handed:  Right  AIMS (if indicated):     Assets:  Financial Resources/Insurance Housing Social Support  ADL's:  Intact  Cognition: WNL  Sleep:  Number of Hours: 5.3     Current Medications: Current Facility-Administered Medications  Medication Dose Route Frequency Provider Last Rate Last Dose  . acetaminophen (TYLENOL) tablet 650 mg  650 mg Oral Q6H PRN Jimmy FootmanAndrea Hernandez-Gonzalez, MD      . alum & mag hydroxide-simeth (MAALOX/MYLANTA) 200-200-20 MG/5ML suspension 30 mL  30 mL Oral Q4H PRN Jimmy FootmanAndrea Hernandez-Gonzalez, MD   30 mL at 12/25/14 2139  . feeding supplement (ENSURE ENLIVE) (ENSURE ENLIVE) liquid 237 mL  237 mL Oral TID BM Jimmy FootmanAndrea Hernandez-Gonzalez, MD      . LORazepam (ATIVAN) tablet 2 mg  2 mg Oral Q6H PRN Jimmy FootmanAndrea Hernandez-Gonzalez, MD      . magnesium hydroxide (MILK OF MAGNESIA) suspension 30 mL  30 mL Oral Daily PRN Jimmy FootmanAndrea Hernandez-Gonzalez, MD      . metoprolol succinate (TOPROL-XL) 24 hr tablet 25 mg  25 mg Oral Once Jimmy FootmanAndrea Hernandez-Gonzalez, MD      . Melene Muller[START ON 12/27/2014] metoprolol succinate (TOPROL-XL) 24 hr tablet 50 mg  50 mg Oral Daily Jimmy FootmanAndrea Hernandez-Gonzalez, MD      . OLANZapine (ZYPREXA) tablet 15 mg  15 mg Oral QHS Jimmy FootmanAndrea Hernandez-Gonzalez, MD   15 mg at 12/25/14 2138  . traZODone (DESYREL) tablet 100 mg  100 mg Oral QHS PRN Jimmy FootmanAndrea Hernandez-Gonzalez, MD   100 mg at 12/24/14 2157    Lab Results:  Results for orders placed or performed during the hospital encounter of 12/24/14 (from the past 48 hour(s))  Lipid panel, fasting     Status: None   Collection Time: 12/25/14  6:53 AM  Result Value Ref Range   Cholesterol 136 0 - 200 mg/dL   Triglycerides 65 <161<150 mg/dL   HDL 45 >09>40 mg/dL   Total CHOL/HDL Ratio 3.0 RATIO   VLDL 13 0 - 40 mg/dL   LDL Cholesterol 78 0 - 99 mg/dL    Comment:        Total Cholesterol/HDL:CHD Risk Coronary Heart Disease Risk Table                      Men   Women  1/2 Average Risk   3.4   3.3  Average Risk       5.0   4.4  2 X Average Risk   9.6   7.1  3 X Average Risk  23.4   11.0        Use the calculated Patient Ratio above and the CHD Risk Table to determine the patient's CHD Risk.        ATP III CLASSIFICATION (LDL):  <100     mg/dL   Optimal  [id number]  mg/dL   Near or Above  Optimal  130-159  mg/dL   Borderline  [id number]  mg/dL   High  >045>190     mg/dL   Very High   Hemoglobin A1c     Status: None   Collection Time: 12/25/14  6:53 AM  Result Value Ref Range   Hgb A1c MFr Bld 5.2 4.0 - 6.0 %  Vitamin B12     Status: None   Collection Time: 12/25/14  6:53 AM  Result Value Ref Range   Vitamin B-12 560 180 - 914 pg/mL    Comment: (NOTE) This assay is not validated for testing neonatal or myeloproliferative syndrome specimens for Vitamin B12 levels. Performed at [facility]     Physical Findings: AIMS: Facial and Oral Movements Muscles of Facial Expression: None, normal Lips and Perioral Area: None, normal Jaw: None, normal Tongue: None, normal,Extremity Movements Upper (arms, wrists, hands, fingers): None, normal Lower (legs, knees, ankles, toes): None, normal, Trunk Movements Neck, shoulders, hips: None, normal, Overall Severity Severity of abnormal movements (highest score from questions above): None, normal Incapacitation due to abnormal movements: None, normal Patient's awareness of abnormal movements (rate only patient's report): No Awareness, Dental Status Current problems with teeth and/or dentures?: No Does patient usually wear dentures?: No  CIWA:    COWS:     Treatment Plan Summary: Daily contact with patient to assess and evaluate symptoms and progress in treatment and Medication management  Schizophrenia continue olanzapine 15 mg by mouth daily at bedtime.   Patient has been reporting sedation from medication however she is not sleeping at night last night she  only slept around 5 hours.  Depression: We'll restart the patient on Lexapro as she had taken this medication in the past when first admitted to behavioral health in [place].  Patient denies depressed mood but at examination she does appear depressed.    Poor appetite poor oral intake : I will order in short 3 times a day and Gatorade 3 times a day.  Insomnia continue trazodone 100 mg by mouth daily at bedtime as needed  Tachycardia continue metoprolol dose will be increased to 50 mg by mouth daily as heart racing continues to be elevated and this morning was 147  continue Ativan 2 mg by mouth every 6 hours when necessary for insomnia or anxiety   Collateral information: Yesterday we were unable to contact any family will attend again today to contact the patient's daughter in family friend.  Per patient's husband is out of the country for 1 month.  Hospitalization status : Continue IVC   Labs: Lipid panel is within the normal limits. Hemoglobin A1c is 5.1.  TSH within the normal limits.  Medical Decision Making:  Established Problem, Stable/Improving (1)     Jimmy FootmanHernandez-Gonzalez,  Beila Purdie 12/26/2014, 1:45 PM

## 2014-12-27 ENCOUNTER — Inpatient Hospital Stay: Payer: 59

## 2014-12-27 DIAGNOSIS — I959 Hypotension, unspecified: Secondary | ICD-10-CM

## 2014-12-27 DIAGNOSIS — R55 Syncope and collapse: Secondary | ICD-10-CM

## 2014-12-27 LAB — CBC
HEMATOCRIT: 39.2 % (ref 35.0–47.0)
HEMOGLOBIN: 13.2 g/dL (ref 12.0–16.0)
MCH: 28.6 pg (ref 26.0–34.0)
MCHC: 33.6 g/dL (ref 32.0–36.0)
MCV: 85 fL (ref 80.0–100.0)
Platelets: 195 10*3/uL (ref 150–440)
RBC: 4.61 MIL/uL (ref 3.80–5.20)
RDW: 13 % (ref 11.5–14.5)
WBC: 4.1 10*3/uL (ref 3.6–11.0)

## 2014-12-27 LAB — BASIC METABOLIC PANEL
ANION GAP: 10 (ref 5–15)
BUN: 17 mg/dL (ref 6–20)
CO2: 27 mmol/L (ref 22–32)
Calcium: 8.8 mg/dL — ABNORMAL LOW (ref 8.9–10.3)
Chloride: 102 mmol/L (ref 101–111)
Creatinine, Ser: 0.64 mg/dL (ref 0.44–1.00)
GFR calc Af Amer: >60 mL/min (ref >60)
Glucose, Bld: 173 mg/dL — ABNORMAL HIGH (ref 65–99)
Potassium: 3.8 mmol/L (ref 3.5–5.1)
Sodium: 139 mmol/L (ref 135–145)

## 2014-12-27 LAB — GLUCOSE, CAPILLARY: Glucose-Capillary: 144 mg/dL — ABNORMAL HIGH (ref 70–99)

## 2014-12-27 MED ORDER — TRAZODONE HCL 100 MG PO TABS: 100.0000 mg | ORAL_TABLET | Freq: Every evening | ORAL | Status: DC | PRN

## 2014-12-27 MED ORDER — ENSURE ENLIVE PO LIQD: 237.0000 mL | Freq: Three times a day (TID) | ORAL | Status: DC

## 2014-12-27 MED ORDER — ENSURE ENLIVE PO LIQD
237.0000 mL | Freq: Four times a day (QID) | ORAL | Status: DC
  Administered 2014-12-28 – 2015-01-01 (×17): 237 mL via ORAL

## 2014-12-27 MED ORDER — OLANZAPINE 15 MG PO TABS: 15.0000 mg | ORAL_TABLET | Freq: Every day | ORAL | Status: DC

## 2014-12-27 MED ORDER — DILTIAZEM HCL 30 MG PO TABS: 30.0000 mg | ORAL_TABLET | Freq: Four times a day (QID) | ORAL | Status: DC | PRN

## 2014-12-27 MED ORDER — ESCITALOPRAM OXALATE 20 MG PO TABS: 20.0000 mg | ORAL_TABLET | Freq: Every day | ORAL | Status: DC

## 2014-12-27 NOTE — [facility] Group Notes (Signed)
Adult Psychoeducational Group Note  Date:  12/27/2014 Time:  2:30 PM  Group Topic/Focus:  Relapse Prevention Planning:   The focus of this group is to define relapse and discuss the need for planning to combat relapse.  Participation Level:  Did Not Attend  Participation Quality:  n/a  Affect:  n/a  Cognitive:  n/a  Insight: n/a  Engagement in Group:  n/a  Modes of Intervention:  n/a  Additional Comments:  Pt did not attend group.   Beryl MeagerIngle, Jury Caserta T 12/27/2014, 2:30 PM

## 2014-12-27 NOTE — Progress Notes (Signed)
Patient remains guarded and withdrawn. Flat affect. Speech soft. slow to respond when answering questions. Isolates in room except for medications. Refused snacks. Ensure offered and accepted. Patient has interpreter during the day for medications and group sessions.  Daily menu completed with family assistance. No voiced thoughts of hurting herself. Denies AV/H. q 15 min checks maintained for safety. Will continue to monitor behavior.

## 2014-12-27 NOTE — Progress Notes (Signed)
Recreation Therapy Notes  Date: 05.06.16 Time: 3:00 pm Location: Craft Room  Group Topic: Communication, Problem solving, and Teamwork  Goal Area(s) Addresses:  Patient will effectively work with peer towards shared goal. Patient will identify skill used to make activity successful. Patient will identify how skills used during activity can be used to reach post d/c goals.  Behavioral Response: Did not attend  Intervention: Life Boat  Activity: Patients were given a list of 16 people (President Obama, [place], pregnant woman, etc.). Patients were instructed to pick 8 people to go on a nicer life boat with the group and the other 8 would go on an inflatable boat.  Education:LRT educated patient on healthy support systems and why they are important.  Education Outcome: Did not attend.  Clinical Observations/Feedback: Did not attend.   Nazaire Cordial M, LRT/CTRS 12/27/2014 4:35 PM 

## 2014-12-27 NOTE — [facility] Group Notes (Signed)
[facility] LCSW Group Therapy  12/27/2014 10:39 AM  Type of Therapy:  Psychoeducational Skills  Participation Level:  Did Not Attend  Participation Quality:  n/a  Affect:  n/a   Cognitive:  n/a  Insight:  n/a  Engagement in Therapy:  n/a  Modes of Intervention:  n/a  Summary of Progress/Problems: Pt did not attend group.  Beryl MeagerIngle, Rolando Hessling T 12/27/2014, 10:39 AM

## 2014-12-27 NOTE — Progress Notes (Signed)
[facility] MD Progress Note  12/27/2014 2:37 PM Alyssa Avila  MRN:  [id number] Subjective:  Patient was seen today. Interpreter was used. Patient reports not sleeping well last night and feeling tired this morning. The patient denies depressed mood, denies having suicidal thoughts homicidal thoughts and hallucinations.  . Appetite continues to be poor energy continues to be poor.  As far as side effects complains of sedation.  Denies having any physical complaints today.  Patient has been withdrawn to her room.   During examination the patient appears to have severe dysphoric mood, poor eye contact, she appears  afraid and paranoid.  Per nursing staff she has not been eating or drinking much. She has been isolated to her room. Today while in group the patient was unsteady on her feet when she fainted and hearing her head against a door. She was found to be hypotensive systolic blood pressure was in the low 80s (caused by poor oral intake and dehydration). The hospitalist service was consulted. They discontinue metoprolol in for tachycardia day is started Cardizem instead. Patient has been encouraged to increase oral intake. Yesterday she only consumed 2 ensures but did not eat any of her meals. She also refused her Lexapro. And since yesterday her heart rate has been in the 130s.  May 5: I was able to speak with the patient's daughter reports that the patient's son had 2 episodes of asthma last week and was rushed to the emergency room twice. After the second time the patient started reporting feeling guilty about going to work and leaving her son along.  The daughter reports that at that point is when the mother is started not sleeping and looking depressed.  Daughter denies that her mother was h hallucinating or voicing delusions. 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 The daughter states that for the last year the patient has been doing  well without any medications.  Appears that the act team services were stopped after she is started working at [employer].     Principal Problem: Schizophrenia Diagnosis:   Patient Active Problem List   Diagnosis Date Noted  . Hypotension [I95.9] 12/27/2014  . Schizophrenia [F20.9] 12/24/2014  . Tachycardia [R00.0] 12/23/2014   Total Time spent with patient: 30 minutes   Past Medical History:  Past Medical History  Diagnosis Date  . Schizophrenia   . Depression    History reviewed. No pertinent past surgical history. Family History: History reviewed. No pertinent family history. Social History:  History  Alcohol Use No     History  Drug Use No    History   Social History  . Marital Status: Married    Spouse Name: N/A  . Number of Children: N/A  . Years of Education: N/A   Social History Main Topics  . Smoking status: Never Smoker   . Smokeless tobacco: Not on file  . Alcohol Use: No  . Drug Use: No  . Sexual Activity: Not on file   Other Topics Concern  . None   Social History Narrative   Additional History:    Sleep: Poor  Appetite:  Poor   Assessment:   Musculoskeletal: Strength & Muscle Tone: within normal limits Gait & Station: normal Patient leans: N/A   Psychiatric Specialty Exam: Physical Exam   Review of Systems  Cardiovascular: Negative for chest pain.  Gastrointestinal: Negative for nausea, vomiting, abdominal pain, diarrhea and constipation.  Musculoskeletal:  Negative for joint pain.  Neurological: Positive for dizziness and weakness. Negative for headaches.  Psychiatric/Behavioral: Negative for depression, suicidal ideas, hallucinations and substance abuse. The patient is not nervous/anxious and does not have insomnia.     Blood pressure 82/58, pulse 65, temperature 98.5 F (36.9 C), temperature source Oral, resp. rate 20, height $RemoveBe'5\' 4"'lySeVtKAr$  (1.626 m), weight 60.328 kg (133 lb), last menstrual period 12/23/2014, SpO2 95 %.Body mass  index is 22.82 kg/(m^2).  General Appearance: Disheveled  Eye Contact::  Poor  Speech:  Slow and Low tone and volume  Volume:  Decreased  Mood:  Dysphoric  Affect:  Blunt  Thought Process:  Vague and concrete  Orientation:  Full (Time, Place, and Person)  Thought Content:  Hallucinations: None  Suicidal Thoughts:  No  Homicidal Thoughts:  No  Memory:  Immediate;   Good Recent;   Good Remote;   Good  Judgement:  Impaired  Insight:  Lacking  Psychomotor Activity:  Decreased  Concentration:  Fair  Recall:  Good  Fund of Knowledge:Fair  Language: Good  Akathisia:  No  Handed:  Right  AIMS (if indicated):     Assets:  Financial Resources/Insurance Housing Social Support  ADL's:  Intact  Cognition: WNL  Sleep:  Number of Hours: 4.3     Current Medications: Current Facility-Administered Medications  Medication Dose Route Frequency Provider Last Rate Last Dose  . acetaminophen (TYLENOL) tablet 650 mg  650 mg Oral Q6H PRN Hildred Priest, MD      . alum & mag hydroxide-simeth (MAALOX/MYLANTA) 200-200-20 MG/5ML suspension 30 mL  30 mL Oral Q4H PRN Hildred Priest, MD   30 mL at 12/25/14 2139  . escitalopram (LEXAPRO) tablet 20 mg  20 mg Oral Daily Hildred Priest, MD   20 mg at 12/27/14 0945  . feeding supplement (ENSURE ENLIVE) (ENSURE ENLIVE) liquid 237 mL  237 mL Oral TID BM Hildred Priest, MD   237 mL at 12/27/14 0951  . gi cocktail (Maalox,Lidocaine,Donnatal)  30 mL Oral BID PRN Hildred Priest, MD   30 mL at 12/26/14 1753  . LORazepam (ATIVAN) tablet 2 mg  2 mg Oral Q6H PRN Hildred Priest, MD      . magnesium hydroxide (MILK OF MAGNESIA) suspension 30 mL  30 mL Oral Daily PRN Hildred Priest, MD      . metoprolol succinate (TOPROL-XL) 24 hr tablet 25 mg  25 mg Oral Once Hildred Priest, MD   25 mg at 12/26/14 1455  . metoprolol succinate (TOPROL-XL) 24 hr tablet 50 mg  50 mg Oral Daily Hildred Priest, MD   50 mg at 12/27/14 0946  . OLANZapine (ZYPREXA) tablet 15 mg  15 mg Oral QHS Hildred Priest, MD   15 mg at 12/26/14 2106  . traZODone (DESYREL) tablet 100 mg  100 mg Oral QHS PRN Hildred Priest, MD   100 mg at 12/24/14 2157    Lab Results:  Results for orders placed or performed during the hospital encounter of 12/24/14 (from the past 48 hour(s))  Glucose, capillary     Status: Abnormal   Collection Time: 12/27/14 11:51 AM  Result Value Ref Range   Glucose-Capillary 144 (H) 70 - 99 mg/dL  Basic metabolic panel     Status: Abnormal   Collection Time: 12/27/14 12:55 PM  Result Value Ref Range   Sodium 139 135 - 145 mmol/L   Potassium 3.8 3.5 - 5.1 mmol/L   Chloride 102 101 - 111 mmol/L   CO2 27 22 -  32 mmol/L   Glucose, Bld 173 (H) 65 - 99 mg/dL   BUN 17 6 - 20 mg/dL   Creatinine, Ser 0.64 0.44 - 1.00 mg/dL   Calcium 8.8 (L) 8.9 - 10.3 mg/dL   GFR calc non Af Amer >60 >60 mL/min   GFR calc Af Amer >60 >60 mL/min    Comment: (NOTE) The eGFR has been calculated using the CKD EPI equation. This calculation has not been validated in all clinical situations. eGFR's persistently <60 mL/min signify possible Chronic Kidney Disease.    Anion gap 10 5 - 15  CBC     Status: None   Collection Time: 12/27/14 12:55 PM  Result Value Ref Range   WBC 4.1 3.6 - 11.0 K/uL   RBC 4.61 3.80 - 5.20 MIL/uL   Hemoglobin 13.2 12.0 - 16.0 g/dL   HCT 39.2 35.0 - 47.0 %   MCV 85.0 80.0 - 100.0 fL   MCH 28.6 26.0 - 34.0 pg   MCHC 33.6 32.0 - 36.0 g/dL   RDW 13.0 11.5 - 14.5 %   Platelets 195 150 - 440 K/uL    Physical Findings: AIMS: Facial and Oral Movements Muscles of Facial Expression: None, normal Lips and Perioral Area: None, normal Jaw: None, normal Tongue: None, normal,Extremity Movements Upper (arms, wrists, hands, fingers): None, normal Lower (legs, knees, ankles, toes): None, normal, Trunk Movements Neck, shoulders, hips: None, normal,  Overall Severity Severity of abnormal movements (highest score from questions above): None, normal Incapacitation due to abnormal movements: None, normal Patient's awareness of abnormal movements (rate only patient's report): No Awareness, Dental Status Current problems with teeth and/or dentures?: No Does patient usually wear dentures?: No  CIWA:    COWS:     Treatment Plan Summary: Daily contact with patient to assess and evaluate symptoms and progress in treatment and Medication management  Schizophrenia continue olanzapine 15 mg by mouth daily at bedtime.   Patient has been reporting sedation from medication however she is not sleeping at night last night she only slept around 4 hours.  Depression: Continue Lexapro 10 mg by mouth daily. This medication was chosen as she has taken it in the past for depression.  Poor appetite poor oral intake : I will order in ensure qid and Gatorade 3 times a day.  Insomnia continue trazodone 100 mg by mouth daily at bedtime as needed  Tachycardia: Metoprolol has been discontinued patient has been started on Cardizem. We're checking vital signs often monitoring oral intake if patient does not improve and blood pressure continues to be hypotensive she will be transferred to the medical floor for IV fluids   continue Ativan 2 mg by mouth every 6 hours when necessary for insomnia or anxiety    Hospitalization status : Continue IVC   Labs: Lipid panel is within the normal limits. Hemoglobin A1c is 5.1.  TSH within the normal limits.  Medical Decision Making:  Established Problem, Stable/Improving (1)     Hildred Priest 12/27/2014, 2:37 PM

## 2014-12-27 NOTE — Consult Note (Signed)
[facility] -  at [facility]   PATIENT NAME: Alyssa Avila    MR#:  [id number]  DATE OF BIRTH:  June 24, 1971  DATE OF ADMISSION:  12/24/2014  PRIMARY CARE PHYSICIAN: No primary care provider on file.   REQUESTING/REFERRING PHYSICIAN: Dr. Ardyth Harps.  CHIEF COMPLAINT:  Syncopal episode.  HISTORY OF PRESENT ILLNESS:  Alyssa Avila  is a 44 y.o. female with a known history of depression, admitted to behavioral medicine inpatient unit for that. She had tachycardia and so she was given metoprolol 25 mg extended release tablets for the last 2 days. She still had some tachycardia yesterday morning so increased the dose to metoprolol 50 mg daily morning. After that around 11:00 today when she was participating with some group activities, when she stood up suddenly and started walking as she was feeling a little dizzy and nauseous, wanted to go towards bathroom but passed out. She was near the wall at that time so she tried to grab the wall and hit her head to the wall and sit down on the floor. On initial evaluation in psychiatric floor her blood pressure noted to be low so urgent medical consult was called in. Patient was seen by me between 12 and 12:30 PM, off during my exam patient is comfortably lying in her bed and communicated properly with me via a translator. She denies any headache blurry vision, focal weakness or numbness, palpitations or shortness of breath, similar episodes in the past. As per the translator and psychiatry PA patient is not eating enough for last 3 days since he is here because of depression.  PAST MEDICAL HISTORY:   Past Medical History  Diagnosis Date  . Schizophrenia   . Depression     PAST SURGICAL HISTOIRY:  History reviewed. No pertinent past surgical history.  SOCIAL HISTORY:   History  Substance Use Topics  . Smoking status: Never Smoker   . Smokeless tobacco: Not on file  . Alcohol Use: No    FAMILY HISTORY:    No cardiac history in family.  DRUG ALLERGIES:  No Known Allergies  REVIEW OF SYSTEMS:  CONSTITUTIONAL: No fever, fatigue or weakness.  EYES: No blurred or double vision.  EARS, NOSE, AND THROAT: No tinnitus or ear pain.  RESPIRATORY: No cough, shortness of breath, wheezing or hemoptysis.  CARDIOVASCULAR: No chest pain, orthopnea, edema.  GASTROINTESTINAL: No nausea, vomiting, diarrhea or abdominal pain.  GENITOURINARY: No dysuria, hematuria.  ENDOCRINE: No polyuria, nocturia,  HEMATOLOGY: No anemia, easy bruising or bleeding SKIN: No rash or lesion. MUSCULOSKELETAL: No joint pain or arthritis.   NEUROLOGIC: No tingling, numbness, weakness.  PSYCHIATRY:  depression.   MEDICATIONS AT HOME:   Prior to Admission medications   Medication Sig Start Date End Date Taking? Authorizing Provider  Multiple Vitamin (MULTIVITAMIN WITH MINERALS) TABS tablet Take 1 tablet by mouth daily.   Yes Historical Provider, MD  escitalopram (LEXAPRO) 20 MG tablet Take 1 tablet (20 mg total) by mouth daily. 12/27/14   Jimmy Footman, MD  feeding supplement, ENSURE ENLIVE, (ENSURE ENLIVE) LIQD Take 237 mLs by mouth 3 (three) times daily between meals. 12/27/14   Jimmy Footman, MD  OLANZapine (ZYPREXA) 15 MG tablet Take 1 tablet (15 mg total) by mouth at bedtime. 12/27/14   Jimmy Footman, MD  traZODone (DESYREL) 100 MG tablet Take 1 tablet (100 mg total) by mouth at bedtime as needed for sleep. 12/27/14   Jimmy Footman, MD      VITAL SIGNS:  Blood pressure  82/58, pulse 65, temperature 98.5 F (36.9 C), temperature source Oral, resp. rate 20, height 5\' 4"  (1.626 m), weight 133 lb (60.328 kg), last menstrual period 12/23/2014, SpO2 95 %.  PHYSICAL EXAMINATION:  GENERAL:  44 y.o.-year-old patient lying in the bed with no acute distress.  EYES: Pupils equal, round, reactive to light and accommodation. No scleral icterus. Extraocular muscles intact.  HEENT: Head  atraumatic, normocephalic. Oropharynx and nasopharynx clear.  NECK:  Supple, no jugular venous distention. No thyroid enlargement, no tenderness.  LUNGS: Normal breath sounds bilaterally, no wheezing, rales,rhonchi or crepitation. No use of accessory muscles of respiration.  CARDIOVASCULAR: S1, S2 normal. No murmurs, rubs, or gallops.  ABDOMEN: Soft, nontender, nondistended. Bowel sounds present. No organomegaly or mass.  EXTREMITIES: No pedal edema, cyanosis, or clubbing.  NEUROLOGIC: Cranial nerves II through XII are intact. Muscle strength 5/5 in all extremities. Sensation intact. Gait not checked.  PSYCHIATRIC: The patient is alert and oriented x 3. Over all very slow, and answers in 1-2 words. SKIN: No obvious rash, lesion, or ulcer.   LABORATORY PANEL:   CBC  Recent Labs Lab 12/27/14 1255  WBC 4.1  HGB 13.2  HCT 39.2  PLT 195   ------------------------------------------------------------------------------------------------------------------  Chemistries   Recent Labs Lab 12/22/14 0225  12/27/14 1255  NA 139  < > 139  K 3.9  < > 3.8  CL 105  < > 102  CO2 23  < > 27  GLUCOSE 116*  < > 173*  BUN 7  < > 17  CREATININE 0.70  < > 0.64  CALCIUM 9.1  < > 8.8*  AST 14*  --   --   ALT 13*  --   --   ALKPHOS 44  --   --   BILITOT 0.7  --   --   < > = values in this interval not displayed. ------------------------------------------------------------------------------------------------------------------  Cardiac Enzymes No results for input(s): TROPONINI in the last 168 hours. ------------------------------------------------------------------------------------------------------------------  RADIOLOGY:  Ct Head Wo Contrast  12/27/2014   CLINICAL DATA:  dizzy and fell, hitting her head on the wall. no external injuries observed.  EXAM: CT HEAD WITHOUT CONTRAST  TECHNIQUE: Contiguous axial images were obtained from the base of the skull through the vertex without intravenous  contrast.  COMPARISON:  None.  FINDINGS: There is no evidence of acute intracranial hemorrhage, brain edema, mass lesion, acute infarction, mass effect, or midline shift. Acute infarct may be inapparent on noncontrast CT. No other intra-axial abnormalities are seen, and the ventricles and sulci are within normal limits in size and symmetry. No abnormal extra-axial fluid collections or masses are identified. No significant calvarial abnormality.  IMPRESSION: 1. Negative for bleed or other acute intracranial process.   Electronically Signed   By: Corlis Leak  Hassell M.D.   On: 12/27/2014 14:08     IMPRESSION AND PLAN:   * Syncopal episode CT scan of the head is done to rule out any bleeding or abnormality. She doesn't seem to be having any infection or cardiac reasons for the episode. Her blood pressure immediately after the episode noted to be on lower side. She was taking metoprolol for last 3 days and the dose is increased today so that might be having cumulative effect after having 3-4 half life of the medicine. Currently patient is not in any acute distress. I suggest to stop metoprolol, and we will order Cardizem 30 mg every 6 hours if needed for her tachycardia which has less tendency to cause hypotension.  Tachycardia seems to be secondary to her psychiatric reasons so I would suggest to treat that.  I spoke to nurse to check the patient's orthostatic vitals if she can stand up. Check her vitals every 4 hours today, and if it drops further patient becomes more symptomatic please call medical services come and see her again. I also advised patient with help of translator to stand up slowly and wait for a few seconds before start walking for today as there might be chances of getting some dizziness due to pressure. As we stopped her metoprolol but patient received last dose today morning, we will keep following her vitals every 4 hours. And we will follow tomorrow for updates.  * Depression. Management  per primary team.   All the records are reviewed and case discussed with Consulting provider. Management plans discussed with the patient, family and they are in agreement.  CODE STATUS: Full  TOTAL TIME TAKING CARE OF THIS PATIENT: 50 minutes.    Altamese DillingVACHHANI, Vicie Cech M.D on 12/27/2014 at 3:21 PM  Between 7am to 6pm - Pager - [phone]  After 6pm go to [email or web address] - password [email or web address]  [facility]  Office  [phone]  CC: Primary care Physician: No primary care provider on file.

## 2014-12-27 NOTE — [facility] Group Notes (Signed)
[facility] Group Notes:  (Nursing/MHT/Case Management/Adjunct)  Date:  12/27/2014  Time:  9:23 PM  Type of Therapy:  Group Therapy  Participation Level:  Did Not Attend  Participation Quality:  n/a  Affect:  n/a  Cognitive:  n/a  Insight:  None  Engagement in Group:  n/a  Modes of Intervention:  n/a  Summary of Progress/Problems:  Lorre MunroeClarence Melvin Anis Cinelli 12/27/2014, 9:23 PM

## 2014-12-27 NOTE — Progress Notes (Signed)
Patient remains guarded and withdrawn. Flat affect. Speech soft. slow to respond when answering questions. Isolates in room except for medications. Refused snacks. Ensure offered and accepted. Visited by family friends. Daily menu completed with family assistance. No voiced thoughts of hurting herself. Denies AV/H.  q 15 min checks maintained for safety. Will continue to monitor behavior.

## 2014-12-27 NOTE — [facility] Group Notes (Signed)
[facility]  12/27/2014 11:35 AM  Participation Quality:  Appropriate and Attentive  Affect:  Appropriate  Cognitive:  Alert and Appropriate  Insight:  Improving  Engagement in Group:  Improving  Modes of Intervention:  Education  Summary of Progress/Problems: Pt came to group and an interpreter was present to assist pt during group. Pt reports that her gaol is to "recover as quickly as possible". Pt identified "Relapse Prevention" as participating in treatment.   Beryl MeagerIngle, Tarica Harl T 12/27/2014, 11:35 AM

## 2014-12-27 NOTE — Progress Notes (Signed)
Adult Services Patient-Family Contact/Session  Attendees:  LCSW and Daughter  Goal(s): To collect additional data to complete assessment   Safety Concerns:  None  Narrative:  LCSW collected pt history from daughter to complete   Barrier(s):  Patient needs an interpreter  Interventions:  none  Recommendation(s):  LCSW explained for safety issues her youngest brother aged 45 would not be permitted in the unit to visit. The 45 year old and herself could. She explained she will try to come out to see her mom on Saturday. Safety concerns were discussed and suicide education was also reviewed. Daughter is very insist ant her mother is not suicidal at all just when she is overtired she cleans excessively and becomes a little sad. Daughter feels she was misdiagnosed entirely. Her Mom is and has been NORMAL.LCSW discussed the [facility] as a resource for herself and her family. She reported they have been here 10 years now and have assimilated. LCSW also made inquiries about supports daughter could access. She reports that she has a great support from her neighbors and friends and is managing and coping fine.  Follow-up Required:  No  Explanation:    Cheron SchaumannBandi, Gift Rueckert M 12/27/2014, 8:27 AM

## 2014-12-27 NOTE — Progress Notes (Addendum)
Patient was in the recreation room became dizzy and fell, hitting her head on the wall.  Patient examined no external injuries observed. Patient was placed in a wheel chair and taken to her room.  Vitals signs obtained and are listed below.  Patient given gatorade and feet elevated.  Hospitalist consult and xray ordered.    12/27/14 1150  Vital Signs  Pulse Rate 65  Pulse Rate Source Dinamap  BP (!) 82/58 mmHg  BP Location Right Arm  BP Method Manual  Patient Position (if appropriate) Lying  Oxygen Therapy  SpO2 95 %  Pain Assessment  Pain Assessment No/denies pain

## 2014-12-27 NOTE — [facility] Group Notes (Signed)
[facility] Group Notes:  (Nursing/MHT/Case Management/Adjunct)  Date:  12/27/2014  Time:  2:19 PM  Type of Therapy:  Group Therapy  Participation Level:  Active  Participation Quality:  Appropriate and Drowsy  Affect:  Appropriate  Cognitive:  Appropriate  Insight:  Appropriate  Engagement in Group:  Improving  Modes of Intervention:  Activity  Summary of Progress/Problems: pt attended group but started to feel dizzy and nausea. Pt got up with interpreter to go back to her room, as she tried to open the door. Pt fell and hit her head against the door. The nurse was notified immediately.   Alyssa Avila 12/27/2014, 2:19 PM

## 2014-12-28 MED ORDER — DILTIAZEM HCL ER COATED BEADS 120 MG PO CP24
120.0000 mg | ORAL_CAPSULE | Freq: Every day | ORAL | Status: DC
  Administered 2014-12-28 – 2015-01-01 (×5): 120 mg via ORAL
  Filled 2014-12-28 (×7): qty 1

## 2014-12-28 NOTE — Progress Notes (Signed)
EKG done

## 2014-12-28 NOTE — [facility] Group Notes (Signed)
[facility] Group Notes:  (Nursing/MHT/Case Management/Adjunct)  Date:  12/28/2014  Time:  11:46 PM  Type of Therapy:  Group Therapy  Participation Level:  Did Not Attend  Participation Quality:  N/A  Affect:  N/A  Cognitive:  N/A  Insight:  None  Engagement in Group:  N/A  Modes of Intervention:  Wrap-up Group  Summary of Progress/Problems:  Alyssa Avila 12/28/2014, 11:46 PM

## 2014-12-28 NOTE — Outcomes Assessment (Signed)
Pleasant, cooperative. Denies pain or distress. States she feels fine. Medication compliant. Denies SI, HI, and AVH. Isolates to room but pleasant when addressed.

## 2014-12-28 NOTE — Progress Notes (Addendum)
[facility] MD Progress Note  12/28/2014 12:34 PM Alyssa Avila  MRN:  [id number] Subjective:  Patient was seen today. Interpreter was used. Patient reports sleeping well last night and feeling less tired this morning. The patient denies depressed mood, denies having suicidal thoughts homicidal thoughts and hallucinations.  . Appetite is now improving and has been eating at least 2 meals a day.  As far as side effects denies any today  Denies having any physical complaints today.  Patient has been withdrawn to her room.   During examination the patient's affect appears to be brighter.  She was seen smiling some, her eye contact was better and her speech was less soft.  Interpreter reported that yesterday she appeared to be responding to internal stimuli as she told him she heard a voice calling her telling her to going take her medications. However he says there was nobody in the room and the nurse did not call the patient.  Yesterday while in group the patient was unsteady on her feet when she fainted and hearing her head against a door. She was found to be hypotensive systolic blood pressure was in the low 80s (caused by poor oral intake and dehydration). The hospitalist service was consulted. They discontinue metoprolol in for tachycardia day is started Cardizem instead. Patient has been encouraged to increase oral intake.   May 5: I was able to speak with the patient's daughter reports that the patient's son had 2 episodes of asthma last week and was rushed to the emergency room twice. After the second time the patient started reporting feeling guilty about going to work and leaving her son along.  The daughter reports that at that point is when the mother is started not sleeping and looking depressed.  Daughter denies that her mother was h hallucinating or voicing delusions. 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 The daughter states that  for the last year the patient has been doing well without any medications.  Appears that the act team services were stopped after she is started working at [employer].     Principal Problem: Schizophrenia Diagnosis:   Patient Active Problem List   Diagnosis Date Noted  . Hypotension [I95.9] 12/27/2014  . Syncopal episodes [R55] 12/27/2014  . Schizophrenia, unspecified type [F20.9]   . Schizophrenia [F20.9] 12/24/2014  . Tachycardia [R00.0] 12/23/2014   Total Time spent with patient: 30 minutes   Past Medical History:  Past Medical History  Diagnosis Date  . Schizophrenia   . Depression    History reviewed. No pertinent past surgical history. Family History: History reviewed. No pertinent family history. Social History:  History  Alcohol Use No     History  Drug Use No    History   Social History  . Marital Status: Married    Spouse Name: N/A  . Number of Children: N/A  . Years of Education: N/A   Social History Main Topics  . Smoking status: Never Smoker   . Smokeless tobacco: Not on file  . Alcohol Use: No  . Drug Use: No  . Sexual Activity: Not on file   Other Topics Concern  . None   Social History Narrative   Additional History:    Sleep: Poor  Appetite:  Poor   Assessment:   Musculoskeletal: Strength & Muscle Tone: within normal limits Gait & Station: normal Patient leans: N/A   Psychiatric Specialty Exam: Physical Exam  Review of Systems  Respiratory: Negative.   Gastrointestinal: Negative for nausea, vomiting, abdominal pain, diarrhea and constipation.  Musculoskeletal: Negative.   Neurological: Negative for dizziness.  Psychiatric/Behavioral: Negative.     Blood pressure 127/87, pulse 107, temperature 98.7 F (37.1 C), temperature source Oral, resp. rate 20, height $RemoveBe'5\' 4"'PWrUmSDxF$  (1.626 m), weight 60.328 kg (133 lb), last menstrual period 12/23/2014, SpO2 97 %.Body mass index is 22.82 kg/(m^2).  General Appearance: Disheveled  Eye  Contact::  Poor  Speech:  Slow and Low tone and volume  Volume:  Decreased  Mood:  Dysphoric  Affect:  Blunt  Thought Process:  Vague and concrete  Orientation:  Full (Time, Place, and Person)  Thought Content:  Hallucinations: None  Suicidal Thoughts:  No  Homicidal Thoughts:  No  Memory:  Immediate;   Good Recent;   Good Remote;   Good  Judgement:  Impaired  Insight:  Lacking  Psychomotor Activity:  Decreased  Concentration:  Fair  Recall:  Good  Fund of Knowledge:Fair  Language: Good  Akathisia:  No  Handed:  Right  AIMS (if indicated):     Assets:  Financial Resources/Insurance Housing Social Support  ADL's:  Intact  Cognition: WNL  Sleep:  Number of Hours: 7     Current Medications: Current Facility-Administered Medications  Medication Dose Route Frequency Provider Last Rate Last Dose  . acetaminophen (TYLENOL) tablet 650 mg  650 mg Oral Q6H PRN Hildred Priest, MD      . alum & mag hydroxide-simeth (MAALOX/MYLANTA) 200-200-20 MG/5ML suspension 30 mL  30 mL Oral Q4H PRN Hildred Priest, MD   30 mL at 12/25/14 2139  . diltiazem (CARDIZEM) tablet 30 mg  30 mg Oral Q6H PRN Vaughan Basta, MD      . escitalopram (LEXAPRO) tablet 20 mg  20 mg Oral Daily Hildred Priest, MD   20 mg at 12/28/14 0902  . feeding supplement (ENSURE ENLIVE) (ENSURE ENLIVE) liquid 237 mL  237 mL Oral QID Hildred Priest, MD   237 mL at 12/28/14 1224  . gi cocktail (Maalox,Lidocaine,Donnatal)  30 mL Oral BID PRN Hildred Priest, MD   30 mL at 12/26/14 1753  . LORazepam (ATIVAN) tablet 2 mg  2 mg Oral Q6H PRN Hildred Priest, MD      . magnesium hydroxide (MILK OF MAGNESIA) suspension 30 mL  30 mL Oral Daily PRN Hildred Priest, MD      . OLANZapine (ZYPREXA) tablet 15 mg  15 mg Oral QHS Hildred Priest, MD   15 mg at 12/27/14 2229  . traZODone (DESYREL) tablet 100 mg  100 mg Oral QHS PRN Hildred Priest, MD   100 mg at 12/24/14 2157    Lab Results:  Results for orders placed or performed during the Avila encounter of 12/24/14 (from the past 48 hour(s))  Glucose, capillary     Status: Abnormal   Collection Time: 12/27/14 11:51 AM  Result Value Ref Range   Glucose-Capillary 144 (H) 70 - 99 mg/dL  Basic metabolic panel     Status: Abnormal   Collection Time: 12/27/14 12:55 PM  Result Value Ref Range   Sodium 139 135 - 145 mmol/L   Potassium 3.8 3.5 - 5.1 mmol/L   Chloride 102 101 - 111 mmol/L   CO2 27 22 - 32 mmol/L   Glucose, Bld 173 (H) 65 - 99 mg/dL   BUN 17 6 - 20 mg/dL   Creatinine, Ser 0.64 0.44 - 1.00 mg/dL   Calcium 8.8 (L) 8.9 -  10.3 mg/dL   GFR calc non Af Amer >60 >60 mL/min   GFR calc Af Amer >60 >60 mL/min    Comment: (NOTE) The eGFR has been calculated using the CKD EPI equation. This calculation has not been validated in all clinical situations. eGFR's persistently <60 mL/min signify possible Chronic Kidney Disease.    Anion gap 10 5 - 15  CBC     Status: None   Collection Time: 12/27/14 12:55 PM  Result Value Ref Range   WBC 4.1 3.6 - 11.0 K/uL   RBC 4.61 3.80 - 5.20 MIL/uL   Hemoglobin 13.2 12.0 - 16.0 g/dL   HCT 39.2 35.0 - 47.0 %   MCV 85.0 80.0 - 100.0 fL   MCH 28.6 26.0 - 34.0 pg   MCHC 33.6 32.0 - 36.0 g/dL   RDW 13.0 11.5 - 14.5 %   Platelets 195 150 - 440 K/uL    Physical Findings: AIMS: Facial and Oral Movements Muscles of Facial Expression: None, normal Lips and Perioral Area: None, normal Jaw: None, normal Tongue: None, normal,Extremity Movements Upper (arms, wrists, hands, fingers): None, normal Lower (legs, knees, ankles, toes): None, normal, Trunk Movements Neck, shoulders, hips: None, normal, Overall Severity Severity of abnormal movements (highest score from questions above): None, normal Incapacitation due to abnormal movements: None, normal Patient's awareness of abnormal movements (rate only patient's  report): No Awareness, Dental Status Current problems with teeth and/or dentures?: No Does patient usually wear dentures?: No  CIWA:    COWS:     Treatment Plan Summary: Daily contact with patient to assess and evaluate symptoms and progress in treatment and Medication management  Schizophrenia continue olanzapine 15 mg by mouth daily at bedtime.    Depression: Continue Lexapro 20 mg by mouth daily. This medication was chosen as she has taken it in the past for depression.  Poor appetite poor oral intake : I will order in ensure qid and Gatorade 3 times a day.  Insomnia continue trazodone 100 mg by mouth daily at bedtime as needed  Tachycardia: Metoprolol has been discontinued patient has been started on Cardizem.  Vital signs are stable however have a continues to be above100.  Hopefully heart rate will improve once patient started eating and drinking more regularly  continue Ativan 2 mg by mouth every 6 hours when necessary for insomnia or anxiety    Hospitalization status : Continue IVC   Labs: Lipid panel is within the normal limits. Hemoglobin A1c is 5.1.  TSH within the normal limits.  Possible discharge in the next 2-3 days  Medical Decision Making:  Established Problem, Stable/Improving (1)     Hildred Priest 12/28/2014, 12:34 PM

## 2014-12-28 NOTE — Plan of Care (Signed)
Problem: Alteration in mood Goal: STG-Patient reports thoughts of self-harm to staff Outcome: Progressing Denies self-harm thoughts.     

## 2014-12-28 NOTE — Progress Notes (Signed)
Dr. Mody in to see pt 

## 2014-12-28 NOTE — [facility] Group Notes (Signed)
[facility] Group Notes:  (Nursing/MHT/Case Management/Adjunct)  Date:  12/28/2014  Time:  9:10 AM  Type of Therapy:  Group Therapy  Participation Level:  Did Not Attend  Participation Quality:  n/a  Affect:  n/a  Cognitive:  n/a  Insight:  None  Engagement in Group:  n/a  Modes of Intervention:  n/a  Summary of Progress/Problems:  Lorre MunroeClarence Melvin [name] 12/28/2014, 9:10 AM

## 2014-12-28 NOTE — Plan of Care (Signed)
Problem: Ineffective individual coping Goal: STG: Patient will remain free from self harm Outcome: Progressing Pt. Denies suicidal thoughts.

## 2014-12-28 NOTE — Plan of Care (Signed)
Problem: Communication/Thought Process Goal: Ability to express needs and understand communication Answers questions, uses body language and facial expression  Problem: Ineffective individual coping Goal: LTG: Patient will report a decrease in negative feelings Outcome: Progressing Patient states she feels better Goal: STG: Patient will remain free from self harm Outcome: Progressing No self harm Goal: STG:Pt. will utilize relaxation techniques to reduce stress STG: Patient will utilize relaxation techniques to reduce stress levels  Outcome: Progressing Resting in room, reading scriptures, visited with family  Problem: Alteration in mood Goal: LTG-Patient reports reduction in suicidal thoughts (Patient reports reduction in suicidal thoughts and is able to verbalize a safety plan for whenever patient is feeling suicidal)  Outcome: Progressing Denies SI  Goal: LTG-Pt's behavior demonstrates decreased signs of depression (Patient's behavior demonstrates decreased signs of depression to the point the patient is safe to return home and continue treatment in an outpatient setting)  Reading, interacting well with family, appropriate and attempts to communicate with staff despite language barrier Goal: STG-Patient is able to discuss feelings and issues (Patient is able to discuss feelings and issues leading to depression)  Outcome: Progressing Answers questions, appropriate eye contact, and responds to facial expression. Significant language barrier Goal: STG-Patient reports thoughts of self-harm to staff Outcome: Progressing denies

## 2014-12-28 NOTE — Progress Notes (Signed)
Pt. Isolates to herself in her room, however, did go outside on patio this afternoon and sat down with others.  Interpreter has been available as needed to communicate with pt.  Appetite fair.  Needs encouragement to drink ensure and fluids.

## 2014-12-28 NOTE — Progress Notes (Signed)
[facility] [place] [facility]   PATIENT NAME: Alyssa Avila    MR#:  [id number]  DATE OF BIRTH:  07/22/1971  SUBJECTIVE:  Patient denies CP, SOB, dizziness  REVIEW OF SYSTEMS:    Review of Systems  Constitutional: Negative for fever, chills and malaise/fatigue.  Respiratory: Negative for cough and hemoptysis.   Cardiovascular: Negative for chest pain and palpitations.  Gastrointestinal: Negative for heartburn, nausea, vomiting and abdominal pain.  Genitourinary: Negative for dysuria, urgency and frequency.  Neurological: Negative for dizziness.  Psychiatric/Behavioral: Positive for depression.  All other systems reviewed and are negative.   Tolerating Diet:  yes      DRUG ALLERGIES:  No Known Allergies  VITALS:  Blood pressure 127/87, pulse 107, temperature 98.7 F (37.1 C), temperature source Oral, resp. rate 20, height 5\' 4"  (1.626 m), weight 60.328 kg (133 lb), last menstrual period 12/23/2014, SpO2 97 %.  PHYSICAL EXAMINATION:   Physical Exam  Constitutional: She is oriented to person, place, and time and well-developed, well-nourished, and in no distress.  HENT:  Head: Normocephalic.  Eyes: Pupils are equal, round, and reactive to light.  Neck: Normal range of motion. Neck supple.  Cardiovascular: Normal rate, regular rhythm and normal heart sounds.   Pulmonary/Chest: Effort normal and breath sounds normal. No respiratory distress. She has no wheezes.  Abdominal: Soft.  Musculoskeletal: Normal range of motion.  Neurological: She is alert and oriented to person, place, and time.  Skin: Skin is dry.  Psychiatric:  Flat affect      LABORATORY PANEL:   CBC  Recent Labs Lab 12/27/14 1255  WBC 4.1  HGB 13.2  HCT 39.2  PLT 195   ------------------------------------------------------------------------------------------------------------------  Chemistries   Recent Labs Lab 12/22/14 0225  12/27/14 1255  NA 139   < > 139  K 3.9  < > 3.8  CL 105  < > 102  CO2 23  < > 27  GLUCOSE 116*  < > 173*  BUN 7  < > 17  CREATININE 0.70  < > 0.64  CALCIUM 9.1  < > 8.8*  AST 14*  --   --   ALT 13*  --   --   ALKPHOS 44  --   --   BILITOT 0.7  --   --   < > = values in this interval not displayed. ------------------------------------------------------------------------------------------------------------------  Cardiac Enzymes No results for input(s): TROPONINI in the last 168 hours. ------------------------------------------------------------------------------------------------------------------  RADIOLOGY:  Ct Head Wo Contrast  12/27/2014     IMPRESSION: 1. Negative for bleed or other acute intracranial process.   Electronically Signed   By: Corlis Leak  Hassell M.D.   On: 12/27/2014 14:08     ASSESSMENT AND PLAN:    1. Syncope: This is likely from metoprolol as her blood pressure was low at the same time of her syncopal episode. Metoprolol has been discontinued. She should remain off of this for now.  I will change dilt to one a day dosing.  She is not significantly orthostatic.  2. Sinus tachycardia: TSH normal.Her heart rate is slightly elevated but she is asymtomatic. Continue to monitor  3. [facility]  . Management plans discussed with the patient and  sheis in agreement.  CODE STATUS: FULL  TOTAL TIME TAKING CARE OF THIS PATIENT: 33 minutes.   I will sign off, call for further questions  Duval Macleod M.D on 12/28/2014 at 12:40 PM  Between 7am to 6pm - Pager - [phone] After  6pm go to [email or web address] - password [email or web address] [facility]  [facility]  Office  [phone]  CC: Primary care physician; No primary care provider on file.

## 2014-12-29 NOTE — [facility] Group Notes (Signed)
[facility] LCSW Group Therapy  12/29/2014 2:45 PM  Type of Therapy:  Psychoeducational Skills  Participation Level:  None  Participation Quality:  Attentive  Affect:  unable to assess  Cognitive:  Alert  Insight:  unable to assess  Engagement in Therapy:  None  Modes of Intervention:  Socialization  Summary of Progress/Problems: Pt attended group and was attentive AEB her body language and eye contact. Pt was assisted by an interpreter but did not participate in group discussion.   Alyssa Avila, [place] Carreno T 12/29/2014, 2:45 PM

## 2014-12-29 NOTE — Progress Notes (Signed)
Patient has been in bed through shift. She did eat supper and had supplemental Ensure. Due to language difficulties, she has not been interacting with peers. She was able to answer questions from this Clinical research associatewriter and was cooperative with all nursing interventions.  Will continue to monitor mood, mental status, sleep, and nutrition.

## 2014-12-29 NOTE — Plan of Care (Signed)
Problem: Alteration in mood Goal: LTG-Pt's behavior demonstrates decreased signs of depression (Patient's behavior demonstrates decreased signs of depression to the point the patient is safe to return home and continue treatment in an outpatient setting)  Outcome: Progressing Patient's affect showing more range. She is more verbal with staff, and has attended some groups. Appetite improving.

## 2014-12-29 NOTE — Plan of Care (Signed)
Problem: Communication/Thought Process Goal: Ability to express needs and understand communication Outcome: Progressing Utilizing some English, body language, and facial expression  Problem: Ineffective individual coping Goal: LTG: Patient will report a decrease in negative feelings Outcome: Progressing States she feels better via her sister who interpreted Goal: STG: Patient will remain free from self harm Outcome: Progressing No self harm Goal: STG:Pt. will utilize relaxation techniques to reduce stress STG: Patient will utilize relaxation techniques to reduce stress levels  Outcome: Progressing Utilizing personal coping skills to feel better  Problem: Alteration in mood Goal: LTG-Patient reports reduction in suicidal thoughts (Patient reports reduction in suicidal thoughts and is able to verbalize a safety plan for whenever patient is feeling suicidal)  Outcome: Progressing Denies Goal: STG-Patient reports thoughts of self-harm to staff Outcome: Progressing Denies

## 2014-12-29 NOTE — Progress Notes (Signed)
[facility] MD Progress Note  12/29/2014 10:51 AM Alyssa Avila  MRN:  [id number] Subjective:  Patient was seen today. Interpreter was used. Patient reports sleeping well last night (per nursing she only slept 1-1/2 hours).The patient denies depressed mood, denies having suicidal thoughts homicidal thoughts and hallucinations.  . Appetite is now improving she ate half of her dinner and have of her breakfast. Her MAR she has been regularly drinking Ensure.  As far as side effects denies any today  Denies having any physical complaints today, denies dizziness or lightheadedness.  Patient has been withdrawn to her room.   During examination the patient's affect appears to be brighter.  She was seen smiling some, her eye contact was better and her speech was less soft.  Interpreter reported that yesterday patient was visited by her pastor and she brightened up and was more talkative.  On 5/6: while in group the patient was unsteady on her feet when she fainted and hearing her head against a door. She was found to be hypotensive systolic blood pressure was in the low 80s (caused by poor oral intake and dehydration). The hospitalist service was consulted. They discontinue metoprolol in for tachycardia day is started Cardizem instead. Patient has been encouraged to increase oral intake.   May 5: I was able to speak with the patient's daughter reports that the patient's son had 2 episodes of asthma last week and was rushed to the emergency room twice. After the second time the patient started reporting feeling guilty about going to work and leaving her son along.  The daughter reports that at that point is when the mother is started not sleeping and looking depressed.  Daughter denies that her mother was h hallucinating or voicing delusions. 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 The daughter states that for the last year the patient has been doing  well without any medications.  Appears that the act team services were stopped after she is started working at [employer].     Principal Problem: Schizophrenia Diagnosis:   Patient Active Problem List   Diagnosis Date Noted  . Hypotension [I95.9] 12/27/2014  . Syncopal episodes [R55] 12/27/2014  . Schizophrenia, unspecified type [F20.9]   . Schizophrenia [F20.9] 12/24/2014  . Tachycardia [R00.0] 12/23/2014   Total Time spent with patient: 30 minutes   Past Medical History:  Past Medical History  Diagnosis Date  . Schizophrenia   . Depression    History reviewed. No pertinent past surgical history. Family History: History reviewed. No pertinent family history. Social History:  History  Alcohol Use No     History  Drug Use No    History   Social History  . Marital Status: Married    Spouse Name: N/A  . Number of Children: N/A  . Years of Education: N/A   Social History Main Topics  . Smoking status: Never Smoker   . Smokeless tobacco: Not on file  . Alcohol Use: No  . Drug Use: No  . Sexual Activity: Not on file   Other Topics Concern  . None   Social History Narrative   Additional History:    Sleep: Fair  Appetite:  Fair   Assessment:   Musculoskeletal: Strength & Muscle Tone: within normal limits Gait & Station: normal Patient leans: N/A   Psychiatric Specialty Exam: Physical Exam   Review of Systems  Respiratory: Negative for cough.   Cardiovascular: Negative  for chest pain.  Gastrointestinal: Negative for nausea, vomiting, abdominal pain, diarrhea and constipation.  Musculoskeletal: Negative for back pain and neck pain.  Neurological: Negative for dizziness and headaches.  Psychiatric/Behavioral: Negative for depression, suicidal ideas, hallucinations and substance abuse. The patient is not nervous/anxious and does not have insomnia.     Blood pressure 109/76, pulse 116, temperature 98.3 F (36.8 C), temperature source Oral, resp. rate  18, height $RemoveBe'5\' 4"'ynvYdmqTG$  (1.626 m), weight 60.328 kg (133 lb), last menstrual period 12/23/2014, SpO2 97 %.Body mass index is 22.82 kg/(m^2).  General Appearance: Disheveled  Eye Contact::  Poor  Speech:  Slow and Low tone and volume  Volume:  Decreased  Mood:  Dysphoric  Affect:  Blunt  Thought Process:  Vague and concrete  Orientation:  Full (Time, Place, and Person)  Thought Content:  Hallucinations: None  Suicidal Thoughts:  No  Homicidal Thoughts:  No  Memory:  Immediate;   Good Recent;   Good Remote;   Good  Judgement:  Impaired  Insight:  Lacking  Psychomotor Activity:  Decreased  Concentration:  Fair  Recall:  Good  Fund of Knowledge:Fair  Language: Good  Akathisia:  No  Handed:  Right  AIMS (if indicated):     Assets:  Financial Resources/Insurance Housing Social Support  ADL's:  Intact  Cognition: WNL  Sleep:  Number of Hours: 1.5     Current Medications: Current Facility-Administered Medications  Medication Dose Route Frequency Provider Last Rate Last Dose  . acetaminophen (TYLENOL) tablet 650 mg  650 mg Oral Q6H PRN Hildred Priest, MD      . alum & mag hydroxide-simeth (MAALOX/MYLANTA) 200-200-20 MG/5ML suspension 30 mL  30 mL Oral Q4H PRN Hildred Priest, MD   30 mL at 12/25/14 2139  . diltiazem (CARDIZEM CD) 24 hr capsule 120 mg  120 mg Oral Daily Sital Mody, MD   120 mg at 12/29/14 1010  . escitalopram (LEXAPRO) tablet 20 mg  20 mg Oral Daily Hildred Priest, MD   20 mg at 12/29/14 1010  . feeding supplement (ENSURE ENLIVE) (ENSURE ENLIVE) liquid 237 mL  237 mL Oral QID Hildred Priest, MD   237 mL at 12/29/14 4496  . gi cocktail (Maalox,Lidocaine,Donnatal)  30 mL Oral BID PRN Hildred Priest, MD   30 mL at 12/26/14 1753  . LORazepam (ATIVAN) tablet 2 mg  2 mg Oral Q6H PRN Hildred Priest, MD      . magnesium hydroxide (MILK OF MAGNESIA) suspension 30 mL  30 mL Oral Daily PRN Hildred Priest, MD       . OLANZapine (ZYPREXA) tablet 15 mg  15 mg Oral QHS Hildred Priest, MD   15 mg at 12/28/14 2200  . traZODone (DESYREL) tablet 100 mg  100 mg Oral QHS PRN Hildred Priest, MD   100 mg at 12/24/14 2157    Lab Results:  Results for orders placed or performed during the hospital encounter of 12/24/14 (from the past 48 hour(s))  Glucose, capillary     Status: Abnormal   Collection Time: 12/27/14 11:51 AM  Result Value Ref Range   Glucose-Capillary 144 (H) 70 - 99 mg/dL  Basic metabolic panel     Status: Abnormal   Collection Time: 12/27/14 12:55 PM  Result Value Ref Range   Sodium 139 135 - 145 mmol/L   Potassium 3.8 3.5 - 5.1 mmol/L   Chloride 102 101 - 111 mmol/L   CO2 27 22 - 32 mmol/L   Glucose, Bld 173 (H) 65 -  99 mg/dL   BUN 17 6 - 20 mg/dL   Creatinine, Ser 0.64 0.44 - 1.00 mg/dL   Calcium 8.8 (L) 8.9 - 10.3 mg/dL   GFR calc non Af Amer >60 >60 mL/min   GFR calc Af Amer >60 >60 mL/min    Comment: (NOTE) The eGFR has been calculated using the CKD EPI equation. This calculation has not been validated in all clinical situations. eGFR's persistently <60 mL/min signify possible Chronic Kidney Disease.    Anion gap 10 5 - 15  CBC     Status: None   Collection Time: 12/27/14 12:55 PM  Result Value Ref Range   WBC 4.1 3.6 - 11.0 K/uL   RBC 4.61 3.80 - 5.20 MIL/uL   Hemoglobin 13.2 12.0 - 16.0 g/dL   HCT 39.2 35.0 - 47.0 %   MCV 85.0 80.0 - 100.0 fL   MCH 28.6 26.0 - 34.0 pg   MCHC 33.6 32.0 - 36.0 g/dL   RDW 13.0 11.5 - 14.5 %   Platelets 195 150 - 440 K/uL    Physical Findings: AIMS: Facial and Oral Movements Muscles of Facial Expression: None, normal Lips and Perioral Area: None, normal Jaw: None, normal Tongue: None, normal,Extremity Movements Upper (arms, wrists, hands, fingers): None, normal Lower (legs, knees, ankles, toes): None, normal, Trunk Movements Neck, shoulders, hips: None, normal, Overall Severity Severity of abnormal movements  (highest score from questions above): None, normal Incapacitation due to abnormal movements: None, normal Patient's awareness of abnormal movements (rate only patient's report): No Awareness, Dental Status Current problems with teeth and/or dentures?: No Does patient usually wear dentures?: No  CIWA:    COWS:     Treatment Plan Summary: Daily contact with patient to assess and evaluate symptoms and progress in treatment and Medication management  Schizophrenia continue olanzapine 15 mg by mouth daily at bedtime.    Depression: Continue Lexapro 20 mg by mouth daily. This medication was chosen as she has taken it in the past for depression.  Poor appetite poor oral intake : I will order in ensure qid and Gatorade 3 times a day.  Insomnia continue trazodone 100 mg by mouth daily at bedtime as needed  Tachycardia: Metoprolol has been discontinued patient has been started on Cardizem.  Vital signs are stable however have a continues to be above100.  Hopefully heart rate will improve once patient started eating and drinking more regularly  continue Ativan 2 mg by mouth every 6 hours when necessary for insomnia or anxiety    Hospitalization status : Continue IVC   Labs: Lipid panel is within the normal limits. Hemoglobin A1c is 5.1.  TSH within the normal limits.  Possible discharge in the next 2 days  Medical Decision Making:  Established Problem, Stable/Improving (1)     Hildred Priest 12/29/2014, 10:51 AM

## 2014-12-29 NOTE — Progress Notes (Signed)
Patient has taken meds today and drank her Ensure. She seems either confused or reluctant when meds are handed to her and needs prompting to take them. Affect is brighter. She is more conversational with staff, with and without an interpreter. Appetite improving. She does not fill out her menu, so she doesn't always like the food, but she will eat certain foods, like pasta, rice, and fruit. No evidence of psychosis. She denies SI.

## 2014-12-30 NOTE — [facility] Group Notes (Signed)
[facility] LCSW Group Therapy  12/30/2014 2:45 PM  Type of Therapy:  Psychoeducational Skills  Participation Level:  Minimal  Participation Quality:  Appropriate and Attentive  Affect:  Appropriate  Cognitive:  Alert, Appropriate and Oriented  Insight:  Improving  Engagement in Therapy:  Improving  Modes of Intervention:  Education and Socialization  Summary of Progress/Problems: Pt introduced self and participated in introduction exercise sharing she would bring "my children, water, and food" if stranded [place]. Pt participated in group discussion appropriately but only when asked questions by the group facilitator. Pt shared that she will overcome obstacles in her life and knows she will be focused on taking care of herself so she can care for her children in the future.     Alyssa Avila, Alejo Beamer T 12/30/2014, 2:45 PM

## 2014-12-30 NOTE — [facility] Group Notes (Signed)
[facility]  12/30/2014 11:45 AM  Participation Quality:  did not attend  Affect:  n/a  Cognitive:  n/a  Insight:  n/a  Engagement in Group:  n/a  Modes of Intervention:  n/a  Summary of Progress/Problems: Pt did not attend group. Beryl Meagerngle, Darrian Goodwill T 12/30/2014, 11:45 AM

## 2014-12-30 NOTE — Progress Notes (Signed)
Patient still very quiet and spending most of the shift in her room. She does go to the dining room for meals with her interpretor.  She is able to answer simple questions and says she does not feel depressed nor is she having any hallucinations. Appetite improved and patient is drinking her supplemental Ensure. Taking medications as ordered.  Will continue to monitor mood and mental status, response to treatment.

## 2014-12-30 NOTE — Progress Notes (Signed)
[facility]  12/30/2014 1:31 PM Alyssa LeavensSerkalem Avila  MRN:  [id number] Subjective:  Patient was seen today. Interpreter was used. Patient reports sleeping well last night. The patient denies depressed mood, denies having suicidal thoughts homicidal thoughts and hallucinations. Appetite is now improving.   As far as side effects denies any today  Denies having any physical complaints today, denies dizziness or lightheadedness.    Per nursing :Patient has been withdrawn to her room. "Patient has taken meds today and drank her Ensure. She seems either confused or reluctant when meds are handed to her and needs prompting to take them. Affect is brighter. She is more conversational with staff, with and without an interpreter. Appetite improving. She does not fill out her menu, so she doesn't always like the food, but she will eat certain foods, like pasta, rice, and fruit. No evidence of psychosis. She denies SI."  During examination the patient's affect appears to be brighter.  She was seen smiling some, her eye contact was better and her speech was less soft and shorter latency for responses.  On 5/6: while in group the patient was unsteady on her feet when she fainted and hearing her head against a door. She was found to be hypotensive systolic blood pressure was in the low 80s (caused by poor oral intake and dehydration). The hospitalist service was consulted. They discontinue metoprolol in for tachycardia day is started Cardizem instead. Patient has been encouraged to increase oral intake.   May 5: I was able to speak with the patient's daughter reports that the patient's son had 2 episodes of asthma last week and was rushed to the emergency room twice. After the second time the patient started reporting feeling guilty about going to work and leaving her son along.  The daughter reports that at that point is when the mother is started not sleeping and looking depressed.  Daughter denies that her  mother was h hallucinating or voicing delusions. 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 The daughter states that for the last year the patient has been doing well without any medications.  Appears that the act team services were stopped after she is started working at [employer].     Principal Problem: Schizophrenia Diagnosis:   Patient Active Problem List   Diagnosis Date Noted  . Hypotension [I95.9] 12/27/2014  . Syncopal episodes [R55] 12/27/2014  . Schizophrenia, unspecified type [F20.9]   . Schizophrenia [F20.9] 12/24/2014  . Tachycardia [R00.0] 12/23/2014   Total Time spent with patient: 30 minutes   Past Medical History:  Past Medical History  Diagnosis Date  . Schizophrenia   . Depression    History reviewed. No pertinent past surgical history. Family History: History reviewed. No pertinent family history. Social History:  History  Alcohol Use No     History  Drug Use No    History   Social History  . Marital Status: Married    Spouse Name: N/A  . Number of Children: N/A  . Years of Education: N/A   Social History Main Topics  . Smoking status: Never Smoker   . Smokeless tobacco: Not on file  . Alcohol Use: No  . Drug Use: No  . Sexual Activity: Not on file   Other Topics Concern  . None   Social History Narrative   Additional History:    Sleep: Fair  Appetite:  Fair   Assessment:  Musculoskeletal: Strength & Muscle Tone: within normal limits Gait & Station: normal Patient leans: N/A   Psychiatric Specialty Exam: Physical Exam   Review of Systems  Respiratory: Negative for cough.   Cardiovascular: Negative for chest pain.  Gastrointestinal: Negative for nausea, vomiting, abdominal pain, diarrhea and constipation.  Musculoskeletal: Negative for back pain.  Neurological: Negative for headaches.  Psychiatric/Behavioral: Negative for depression, suicidal ideas,  hallucinations and substance abuse. The patient is not nervous/anxious and does not have insomnia.     Blood pressure 118/82, pulse 109, temperature 99 F (37.2 C), temperature source Oral, resp. rate 18, height 5\' 4"  (1.626 m), weight 60.328 kg (133 lb), last menstrual period 12/23/2014, SpO2 97 %.Body mass index is 22.82 kg/(m^2).  General Appearance: Disheveled  Eye Contact::  Poor  Speech:  Slow and Low tone and volume  Volume:  Decreased  Mood:  Dysphoric  Affect:  Blunt  Thought Process:  Vague and concrete  Orientation:  Full (Time, Place, and Person)  Thought Content:  Hallucinations: None  Suicidal Thoughts:  No  Homicidal Thoughts:  No  Memory:  Immediate;   Good Recent;   Good Remote;   Good  Judgement:  Impaired  Insight:  Lacking  Psychomotor Activity:  Decreased  Concentration:  Fair  Recall:  Good  Fund of Knowledge:Fair  Language: Good  Akathisia:  No  Handed:  Right  AIMS (if indicated):     Assets:  Financial Resources/Insurance Housing Social Support  ADL's:  Intact  Cognition: WNL  Sleep:  Number of Hours: 7.3     Current Medications: Current Facility-Administered Medications  Medication Dose Route Frequency Provider Last Rate Last Dose  . acetaminophen (TYLENOL) tablet 650 mg  650 mg Oral Q6H PRN Jimmy FootmanAndrea Hernandez-Gonzalez, MD      . alum & mag hydroxide-simeth (MAALOX/MYLANTA) 200-200-20 MG/5ML suspension 30 mL  30 mL Oral Q4H PRN Jimmy FootmanAndrea Hernandez-Gonzalez, MD   30 mL at 12/25/14 2139  . diltiazem (CARDIZEM CD) 24 hr capsule 120 mg  120 mg Oral Daily Adrian SaranSital Mody, MD   120 mg at 12/30/14 0914  . escitalopram (LEXAPRO) tablet 20 mg  20 mg Oral Daily Jimmy FootmanAndrea Hernandez-Gonzalez, MD   20 mg at 12/30/14 0914  . feeding supplement (ENSURE ENLIVE) (ENSURE ENLIVE) liquid 237 mL  237 mL Oral QID Jimmy FootmanAndrea Hernandez-Gonzalez, MD   237 mL at 12/30/14 1155  . gi cocktail (Maalox,Lidocaine,Donnatal)  30 mL Oral BID PRN Jimmy FootmanAndrea Hernandez-Gonzalez, MD   30 mL at 12/26/14  1753  . LORazepam (ATIVAN) tablet 2 mg  2 mg Oral Q6H PRN Jimmy FootmanAndrea Hernandez-Gonzalez, MD      . magnesium hydroxide (MILK OF MAGNESIA) suspension 30 mL  30 mL Oral Daily PRN Jimmy FootmanAndrea Hernandez-Gonzalez, MD      . OLANZapine (ZYPREXA) tablet 15 mg  15 mg Oral QHS Jimmy FootmanAndrea Hernandez-Gonzalez, MD   15 mg at 12/29/14 2137  . traZODone (DESYREL) tablet 100 mg  100 mg Oral QHS PRN Jimmy FootmanAndrea Hernandez-Gonzalez, MD   100 mg at 12/24/14 2157    Lab Results:  No results found for this or any previous visit (from the past 48 hour(s)).  Physical Findings: AIMS: Facial and Oral Movements Muscles of Facial Expression: None, normal Lips and Perioral Area: None, normal Jaw: None, normal Tongue: None, normal,Extremity Movements Upper (arms, wrists, hands, fingers): None, normal Lower (legs, knees, ankles, toes): None, normal, Trunk Movements Neck, shoulders, hips: None, normal, Overall Severity Severity of abnormal movements (highest score from questions above): None, normal Incapacitation due to  abnormal movements: None, normal Patient's awareness of abnormal movements (rate only patient's report): No Awareness, Dental Status Current problems with teeth and/or dentures?: No Does patient usually wear dentures?: No  CIWA:    COWS:     Treatment Plan Summary: Daily contact with patient to assess and evaluate symptoms and progress in treatment and Medication management  Schizophrenia continue olanzapine 15 mg by mouth daily at bedtime.    Depression: Continue Lexapro 20 mg by mouth daily. This medication was chosen as she has taken it in the past for depression.  Poor appetite poor oral intake : I will order in ensure qid and Gatorade 3 times a day.  Insomnia continue trazodone 100 mg by mouth daily at bedtime as needed  Tachycardia: Continue Cardizem and when necessary  continue Ativan 2 mg by mouth every 6 hours when necessary for insomnia or anxiety    Hospitalization status : Continue IVC   Labs:  Lipid panel is within the normal limits. Hemoglobin A1c is 5.1.  TSH within the normal limits.  Possible discharge in the next 2 days.  Patient's husband is supposed to return from [place] today. We'll attempt to set up a family meeting on Tuesday or Wednesday and possibly targeted for discharge on Wednesday.  Medical Decision Making:  Established Problem, Stable/Improving (1)     Jimmy Footman 12/30/2014, 1:31 PM

## 2014-12-30 NOTE — Progress Notes (Signed)
Recreation Therapy Notes  Date: 05.09.16 Time: 3:00 pm Location: Craft Room  Group Topic: Self-expression  Goal Area(s) Addresses:  Patient will be able to identify a color that represents each emotion. Patient will verbalize benefit of using art as a means of self-expression. Patient will verbalize one positive emotion experienced while participating in activity.  Behavioral Response: Did not attend  Intervention: The Colors Within Me  Activity: Patients were given a blank face worksheet and instructed to pick a color for each emotion they were experiencing and to color how much of that emotion they were experiencing on the face worksheet.  Education: LRT educated patient on self-expression and different forms of self-expression.   Education Outcome: Did not attend  Clinical Observations/Feedback: Patient did not attend group.   Jacquelynn CreeGreene,Shanae Luo M, LRT/CTRS 12/30/2014 4:28 PM

## 2014-12-30 NOTE — [facility] Group Notes (Signed)
[facility] Group Notes:  (Nursing/MHT/Case Management/Adjunct)  Date:  12/30/2014  Time:  10:06 PM  Type of Therapy:  Group Therapy  Participation Level:  Active  Participation Quality:  Appropriate  Affect:  Appropriate  Cognitive:  Appropriate  Insight:  Improving  Engagement in Group:  Improving  Modes of Intervention:  n/a  Summary of Progress/Problems:  Alyssa Avila 12/30/2014, 10:06 PM

## 2014-12-30 NOTE — Progress Notes (Signed)
Initial Nutrition Assessment  DOCUMENTATION CODES:     INTERVENTION:   Meals and snacks: Cater to pt prefences  NUTRITION DIAGNOSIS:   none  GOAL:     Goal would be for pt to meet at least 90% of estimated energy needs  MONITOR:  Energy intake:  REASON FOR ASSESSMENT:   (at length of stay)    ASSESSMENT:  Pt admitted with depression.   Pt eating 45% of meals per I and O sheet and drinking ensure.   Labs and medications reviewed  Height:  Ht Readings from Last 1 Encounters:  12/24/14 5\' 4"  (1.626 m)    Weight:  Wt Readings from Last 1 Encounters:  12/24/14 133 lb (60.328 kg)        Wt Readings from Last 10 Encounters:  12/24/14 133 lb (60.328 kg)  12/09/14 144 lb (65.318 kg)    BMI:  Body mass index is 22.82 kg/(m^2).  Estimated Nutritional Needs:     Diet Order:  Diet regular Room service appropriate?: Yes; Fluid consistency:: Thin Diet general  EDUCATION NEEDS:   No education needs at this time   Intake/Output Summary (Last 24 hours) at 12/30/14 1640 Last data filed at 12/30/14 1315  Gross per 24 hour  Intake    360 ml  Output      0 ml  Net    360 ml      Low level of care  Hetty Linhart B. Freida BusmanAllen, RD, LDN [phone] (pager)

## 2014-12-31 NOTE — Progress Notes (Signed)
Recreation Therapy Notes  Date: 05.10.16 Time: 3:00 pm Location: Craft Room  Group Topic: Goal Setting  Goal Area(s) Addresses:  Patient will list at least 3 goals. Patient will list obstacles toward their goals.  Behavioral Response: Did not attend  Intervention: Recovery Goal Chart  Activity: Patients were instructed to list 3 goals towards their recovery, obstacles towards their goals, the date they started working on their goals, and the date they achieved their goals.  Education: LRT educated patient on goal setting and why it is important.  Education Outcome: Did not attend  Clinical Observations/Feedback: Patient did not attend.   Jacquelynn CreeGreene,Reinhardt Licausi M, LRT/CTRS 12/31/2014 4:21 PM

## 2014-12-31 NOTE — [facility] Group Notes (Signed)
[facility] Group Notes:  (Nursing/MHT/Case Management/Adjunct)  Date:  12/31/2014  Time:  11:55 AM  Type of Therapy:  Group Therapy  Participation Level:  Active  Participation Quality:  Appropriate  Affect:  Appropriate  Cognitive:  Oriented  Insight:  Appropriate  Engagement in Group:  Engaged  Modes of Intervention:  Discussion, Education and Problem-solving  Summary of Progress/Problems:  Alyssa Avila M Alyssa Avila 12/31/2014, 11:55 AM

## 2014-12-31 NOTE — Progress Notes (Signed)
D: Patient denies SI/HI/AVH. Patient affect and mood are flat.  Patient did attend evening group. Patient visible on the milieu. No distress noted. A: Support and encouragement offered. Scheduled medications given to pt. Q 15 min checks continued for patient safety. R: Patient receptive. Patient remains safe on the unit.

## 2014-12-31 NOTE — Progress Notes (Signed)
[facility]  12/31/2014 10:53 AM Alyssa Avila  MRN:  [id number] Subjective:  Patient was seen today. Interpreter was used. Patient reports sleeping well last night. The patient denies depressed mood, denies having suicidal thoughts homicidal thoughts and hallucinations. Appetite is now improving.   As far as side effects she complains of having some lightheadedness and dizziness especially when going from lying down to standing up.  Per nursing :Patient has been less withdrawn. She started again to attend groups. She has been compliant with medications and appears to be improving.  Per records appears her oral intake is also improving and she has been drinking and sure 4 times a day and has been eating at least 50% of her meals.   On 5/6: while in group the patient was unsteady on her feet when she fainted and hearing her head against a door. She was found to be hypotensive systolic blood pressure was in the low 80s (caused by poor oral intake and dehydration). The hospitalist service was consulted. They discontinue metoprolol in for tachycardia day is started Cardizem instead. Patient has been encouraged to increase oral intake.   May 5: I was able to speak with the patient's daughter reports that the patient's son had 2 episodes of asthma last week and was rushed to the emergency room twice. After the second time the patient started reporting feeling guilty about going to work and leaving her son along.  The daughter reports that at that point is when the mother is started not sleeping and looking depressed.  Daughter denies that her mother was h hallucinating or voicing delusions. 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 The daughter states that for the last year the patient has been doing well without any medications.  Appears that the act team services were stopped after she is started working at [employer].      Principal Problem: Schizophrenia Diagnosis:   Patient Active Problem List   Diagnosis Date Noted  . Hypotension [I95.9] 12/27/2014  . Syncopal episodes [R55] 12/27/2014  . Schizophrenia, unspecified type [F20.9]   . Schizophrenia [F20.9] 12/24/2014  . Tachycardia [R00.0] 12/23/2014   Total Time spent with patient: 30 minutes   Past Medical History:  Past Medical History  Diagnosis Date  . Schizophrenia   . Depression    History reviewed. No pertinent past surgical history. Family History: History reviewed. No pertinent family history. Social History:  History  Alcohol Use No     History  Drug Use No    History   Social History  . Marital Status: Married    Spouse Name: N/A  . Number of Children: N/A  . Years of Education: N/A   Social History Main Topics  . Smoking status: Never Smoker   . Smokeless tobacco: Not on file  . Alcohol Use: No  . Drug Use: No  . Sexual Activity: Not on file   Other Topics Concern  . None   Social History Narrative   Additional History:    Sleep: Fair  Appetite:  Fair   Assessment:   Musculoskeletal: Strength & Muscle Tone: within normal limits Gait & Station: normal Patient leans: N/A   Psychiatric Specialty Exam: Physical Exam   Review of Systems  Respiratory: Negative for cough.   Cardiovascular: Negative for chest pain.  Gastrointestinal: Negative for nausea, vomiting, abdominal pain, diarrhea and constipation.  Musculoskeletal: Negative for joint pain.  Neurological: Positive for dizziness. Negative for headaches.  Psychiatric/Behavioral: Negative for depression, suicidal ideas, hallucinations and substance abuse. The patient is not nervous/anxious and does not have insomnia.     Blood pressure 125/86, pulse 109, temperature 98.6 F (37 C), temperature source Oral, resp. rate 18, height 5\' 4"  (1.626 m), weight 60.328 kg (133 lb), last menstrual period 12/23/2014, SpO2 97 %.Body mass index is 22.82  kg/(m^2).  General Appearance: Disheveled  Eye Contact::  Poor  Speech:  Slow and Low tone and volume  Volume:  Decreased  Mood:  Dysphoric  Affect:  Blunt  Thought Process:  Vague and concrete  Orientation:  Full (Time, Place, and Person)  Thought Content:  Hallucinations: None  Suicidal Thoughts:  No  Homicidal Thoughts:  No  Memory:  Immediate;   Good Recent;   Good Remote;   Good  Judgement:  Impaired  Insight:  Lacking  Psychomotor Activity:  Decreased  Concentration:  Fair  Recall:  Good  Fund of Knowledge:Fair  Language: Good  Akathisia:  No  Handed:  Right  AIMS (if indicated):     Assets:  Financial Resources/Insurance Housing Social Support  ADL's:  Intact  Cognition: WNL  Sleep:  Number of Hours: 8     Current Medications: Current Facility-Administered Medications  Medication Dose Route Frequency Provider Last Rate Last Dose  . acetaminophen (TYLENOL) tablet 650 mg  650 mg Oral Q6H PRN Jimmy FootmanAndrea Hernandez-Gonzalez, MD      . alum & mag hydroxide-simeth (MAALOX/MYLANTA) 200-200-20 MG/5ML suspension 30 mL  30 mL Oral Q4H PRN Jimmy FootmanAndrea Hernandez-Gonzalez, MD   30 mL at 12/25/14 2139  . diltiazem (CARDIZEM CD) 24 hr capsule 120 mg  120 mg Oral Daily Adrian SaranSital Mody, MD   120 mg at 12/31/14 0919  . escitalopram (LEXAPRO) tablet 20 mg  20 mg Oral Daily Jimmy FootmanAndrea Hernandez-Gonzalez, MD   20 mg at 12/31/14 0919  . feeding supplement (ENSURE ENLIVE) (ENSURE ENLIVE) liquid 237 mL  237 mL Oral QID Jimmy FootmanAndrea Hernandez-Gonzalez, MD   237 mL at 12/31/14 [id number]  . gi cocktail (Maalox,Lidocaine,Donnatal)  30 mL Oral BID PRN Jimmy FootmanAndrea Hernandez-Gonzalez, MD   30 mL at 12/26/14 1753  . LORazepam (ATIVAN) tablet 2 mg  2 mg Oral Q6H PRN Jimmy FootmanAndrea Hernandez-Gonzalez, MD      . magnesium hydroxide (MILK OF MAGNESIA) suspension 30 mL  30 mL Oral Daily PRN Jimmy FootmanAndrea Hernandez-Gonzalez, MD      . OLANZapine (ZYPREXA) tablet 15 mg  15 mg Oral QHS Jimmy FootmanAndrea Hernandez-Gonzalez, MD   15 mg at 12/30/14 2140  . traZODone  (DESYREL) tablet 100 mg  100 mg Oral QHS PRN Jimmy FootmanAndrea Hernandez-Gonzalez, MD   100 mg at 12/24/14 2157    Lab Results:  No results found for this or any previous visit (from the past 48 hour(s)).  Physical Findings: AIMS: Facial and Oral Movements Muscles of Facial Expression: None, normal Lips and Perioral Area: None, normal Jaw: None, normal Tongue: None, normal,Extremity Movements Upper (arms, wrists, hands, fingers): None, normal Lower (legs, knees, ankles, toes): None, normal, Trunk Movements Neck, shoulders, hips: None, normal, Overall Severity Severity of abnormal movements (highest score from questions above): None, normal Incapacitation due to abnormal movements: None, normal Patient's awareness of abnormal movements (rate only patient's report): No Awareness, Dental Status Current problems with teeth and/or dentures?: No Does patient usually wear dentures?: No  CIWA:    COWS:     Treatment Plan Summary: Daily contact with patient to assess and evaluate symptoms and progress  in treatment and Medication management  Schizophrenia continue olanzapine 15 mg by mouth daily at bedtime.    Depression: Continue Lexapro 20 mg by mouth daily. This medication was chosen as she has taken it in the past for depression.  Poor appetite poor oral intake : I will order in ensure qid and Gatorade 3 times a day.  Oral intake is improving  Insomnia continue trazodone 100 mg by mouth daily at bedtime as needed.    Tachycardia: Continue Cardizem and when necessary  continue Ativan 2 mg by mouth every 6 hours when necessary for insomnia or anxiety    Hospitalization status : Continue IVC   Labs: Lipid panel is within the normal limits. Hemoglobin A1c is 5.1.  TSH within the normal limits.  Possible discharge tomorrow.  Family meeting has been scheduled for Wednesday morning if there is no concerns from the family the patient will be discharge that day.  Medical Decision Making:   Established Problem, Stable/Improving (1)     Jimmy Footman 12/31/2014, 10:53 AM

## 2014-12-31 NOTE — [facility] Group Notes (Signed)
[facility]   May 7th,2016 1pm  Type of Therapy:  Group Therapy  Participation Level:  Active  Participation Quality:  Attentive  Affect:  Appropriate  Cognitive:  Appropriate  Insight:  Engaged  Engagement in Therapy:  Improving  Modes of Intervention:  Discussion  Summary of Progress/Problems:LCSW  With interpreter help had patient contribute her thoughts and feeling about anger and her focus was to enjoy the outdoors and participate more in group. She was very quiet but appeared to enjoy group dynamics.  Johnella MoloneyBandi, Djuan Talton M 12/31/2014, 8:20 AM

## 2014-12-31 NOTE — [facility] Group Notes (Signed)
[facility] LCSW Group Therapy  12/31/2014 2:41 PM  Type of Therapy:  Psychoeducational Skills  Participation Level:  Minimal  Participation Quality:  Appropriate, Attentive, Sharing and Left group early and did not return after 40 minutes of group  Affect:  Appropriate  Cognitive:  Alert, Appropriate and Oriented  Insight:  Improving  Engagement in Therapy:  Improving  Modes of Intervention:  Socialization and Support  Summary of Progress/Problems: Pt attended group and participated appropriately sharing that she has felt good today and wants to get back to her children. Pt reports that she is sleeping better and her appetite is improving.   Beryl MeagerIngle, Azzam Mehra T 12/31/2014, 2:41 PM

## 2014-12-31 NOTE — Tx Team (Signed)
Interdisciplinary Treatment Plan Update (Adult)  Date:  12/31/2014 Time Reviewed:  10:02 AM  Progress in Treatment: Attending groups: No. Participating in groups:  No. Taking medication as prescribed:  Yes. Tolerating medication:  Yes. Family/Significant othe contact made:  Yes, individual(s) contacted:  Daughter was called but only came in for family visit last night. Patient understands diagnosis:  Yes. Discussing patient identified problems/goals with staff:  Yes. Medical problems stabilized or resolved:  Yes. Denies suicidal/homicidal ideation: Yes. and No. Issues/concerns per patient self-inventory:  Yes. Other:  New problem(s) identified: Yes, Describe:  Language barrier  Discharge Plan or Barriers: [facility](does not meet criteria for [employer]-has insurance, will attempt to find psychiatrist  Reason for Continuation of Hospitalization: Depression, psychosis,poor appetite, started on zyprexa-awaiting stabilization, will consider long acting injectable, Patient doing well  Estimated length of stay:1 days  New goal(s):To return to her family  Review of initial/current patient goals per problem list:   See Plan of care Attendees: Patient:  Alyssa Avila 5/10/201610:02 AM  Family:   5/10/201610:02 AM  Physician:  Dr Ardyth HarpsHernandez 5/10/201610:02 AM  Nursing:   Wendy 5/10/201610:02 AM  Case Manager:   5/10/201610:02 AM  Counselor:   5/10/201610:02 AM  Other:   Other: Arrie Senatelaudine Kirke Breach LCSW 5/5/20167:57 AM  Other: Beryl MeagerJason Ingle LCSWA 5/5/20167:57 AM  Other: Jake SharkSara Laws LCSW 5/5/20167:57 AM  Other:  5/5/20167:57 AM  Other: Quintella BatonElizabeth GreeneLRT 5/5/20167:57 AM   5/5/20167:57 AM   5/5/20167:57 AM   5/5/20167:57 AM        5/10/201610:02 AM  Other:  Arrie Senatelaudine Maimouna Rondeau LCSW 5/10/201610:02 AM  Other:  Ned ClinesJason Ingles LCSWA 5/10/201610:02 AM  Other:  Jake SharkSara Laws LCSW 5/10/201610:02 AM  Other: Charlotta NewtonEliabeth Greene LRT 5/10/201610:02 AM  Other:  Smitty CordsMatt Hunter 5/10/201610:02 AM   Other: Unk PintoHarvey Bryant 5/10/201610:02 AM  Other:  5/10/201610:02 AM  Other:  5/10/201610:02 AM  Other:   5/10/201610:02 AM   Scribe for Treatment Team:   Cheron SchaumannBandi, [name], 12/31/2014, 10:02 AM

## 2014-12-31 NOTE — Progress Notes (Signed)
LCSW called Daughter and left a detailed message for family session follow up with her father and herself for Wednesday. Awaiting a call back

## 2014-12-31 NOTE — Progress Notes (Signed)
Patient met with interpreter this am shift .Encourage patient to come to Probation officer  For any concerns . Voice concerns around her medication leaving her mouth dry , bitter and sometimes dizzy. Encourage patient to rise from a sitting position slowly  . Patient denies suicidal and voice of depression 5 ( low 0-10 high) Appropriate ADL'S  And personal chores . Instructed patient on medication , verbalize understanding Patient had interrupter  On unit all am shift  Name [place] .  Patient call family in preparation for discharge tomorrow at 7 noon.

## 2014-12-31 NOTE — Plan of Care (Signed)
Problem: Ineffective individual coping Goal: STG: Pt will be able to identify effective and ineffective STG: Pt will be able to identify effective and ineffective coping patterns  Outcome: Progressing Encourage patient to list different ways of copping

## 2014-12-31 NOTE — Plan of Care (Signed)
Problem: Ineffective individual coping Goal: LTG: Patient will report a decrease in negative feelings Outcome: Progressing Working with interpreter for understanding Clear information.

## 2015-01-01 DIAGNOSIS — F250 Schizoaffective disorder, bipolar type: Secondary | ICD-10-CM

## 2015-01-01 MED ORDER — ESCITALOPRAM OXALATE 20 MG PO TABS: 20.0000 mg | ORAL_TABLET | Freq: Every day | ORAL | Status: DC

## 2015-01-01 MED ORDER — DILTIAZEM HCL ER COATED BEADS 120 MG PO CP24: 120.0000 mg | ORAL_CAPSULE | Freq: Every day | ORAL | Status: DC
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
## 2015-01-01 NOTE — Progress Notes (Signed)
  [facility] :  Will you be returning to the same living situation after discharge:  Yes,    At discharge, do you have transportation home?: Yes,    Do you have the ability to pay for your medications: Yes,     Release of information consent forms completed and in the chart;  Patient's signature needed at discharge.  Patient to Follow up at: Follow-up Information    [facility] Dr Donell BeersPlovsky. Go on 02/13/2015.   Why:  Hospital Discharge Follow up (June 23rd,2016 at 2:15pm) Will be supported to family physician until this appointment.   Contact information:   [address] [place] Phone# [phone] Fax [phone]      Patient denies SI/HI: Yes,       Safety Planning and Suicide Prevention discussed: Yes with interpreter support patient was provided education and a handout which was translated for patient verbally. She was insistent that she is not suicidal  Have you used any form of tobacco in the last 30 days? (Cigarettes, Smokeless Tobacco, Cigars, and/or Pipes): No  Has patient been referred to the Quitline?: N/A patient is not a smoker  Johnella MoloneyBandi, Rhetta Cleek M 01/01/2015, 8:24 AM

## 2015-01-01 NOTE — [facility] Suicide Risk Assessment (Signed)
[facility] INPATIENT:  Family/Significant Other Suicide Prevention Education  Suicide Prevention Education:  Education Completed; Mr Melina SchoolsMekonnen ( Husband)has been identified by the patient as the family member/significant other with whom the patient will be residing, and identified as the person(s) who will aid the patient in the event of a mental health crisis (suicidal ideations/suicide attempt).  With written consent from the patient, the family member/significant other has been provided the following suicide prevention education, prior to the and/or following the discharge of the patient.  The suicide prevention education provided includes the following:  Suicide risk factors  Suicide prevention and interventions  National Suicide Hotline telephone number  [facility] assessment telephone number  [facility] Emergency Assistance 911  [facility] and/or Residential Mobile Crisis Unit telephone number  Request made of family/significant other to:  Remove weapons (e.g., guns, rifles, knives), all items previously/currently identified as safety concern.    Remove drugs/medications (over-the-counter, prescriptions, illicit drugs), all items previously/currently identified as a safety concern.  The family member/significant other verbalizes understanding of the suicide prevention education information provided.  The family member/significant other agrees to remove the items of safety concern listed above.  Arrie SenateBandi, Doneshia Hill M 01/01/2015, 8:17 AM

## 2015-01-01 NOTE — Progress Notes (Signed)
D: Pt denies SI/HI/AV. Pt is pleasant and cooperative. Pt goal for today is to work on her communication with her family and getting better.  A: Pt was offered support and encouragement. Pt was given scheduled medications. Pt was encourage to attend groups. Q 15 minute checks were done for safety.  R:Pt attends groups and interacts well with peers and staff. Pt is taking medication. Pt has no complaints.Pt receptive to treatment and safety maintained on unit.

## 2015-01-01 NOTE — Progress Notes (Signed)
Pt discharged home. DC instructions provided and explained. Medications reviewed. Rx given. Interpreter present. All questions answered. Pt stable at discharge. Pt eating meals well. Denies SI, HI, AVH. Pt happy to be discharged.

## 2015-01-01 NOTE — [facility] Group Notes (Signed)
[facility] Dis[facility] Note  01/01/2015 10:03 AM  Participation Quality:  Appropriate and Attentive  Affect:  Appropriate  Cognitive:  Alert and Appropriate  Insight:  Improving  Engagement in Group:  Improving  Modes of Intervention:  Education and Socialization  Summary of Progress/Problems: Pt participated in group appropriately and utilized interpreter services to communicate. Pt shared that her SMART goal is to "take the workbook and material home to read and use as tools". Pt reports that she looks forward to spending time with her children. Pt identified "Personal Development" as being independent and being able to clean and care for her family independently.   Alyssa Avila, [name] 01/01/2015, 10:03 AM

## 2015-01-01 NOTE — Plan of Care (Signed)
Problem: Ineffective individual coping Goal: LTG: Patient will report a decrease in negative feelings Outcome: Progressing Patient denies SI/HI. Goal: LTG-Other (Specify)- Outcome: Progressing Interacting appropriately with peers and staff.

## 2015-01-01 NOTE — [facility] Suicide Risk Assessment (Signed)
[facility]   Demographic Factors:  married, african, female, employed, with children under age 45  Total Time spent with patient: 30 minutes  Musculoskeletal: Strength & Muscle Tone: within normal limits Gait & Station: normal Patient leans: N/A  Psychiatric Specialty Exam: Physical Exam  ROS                                                         Have you used any form of tobacco in the last 30 days? (Cigarettes, Smokeless Tobacco, Cigars, and/or Pipes): No  Has this patient used any form of tobacco in the last 30 days? (Cigarettes, Smokeless Tobacco, Cigars, and/or Pipes) No  Mental Status Per Nursing Assessment::   On Admission:     Current Mental Status by Physician: no psychosis, no SI, HI or hallucinations  Loss Factors: none  Historical Factors: prior history of psychosis  Risk Reduction Factors:   Responsible for children under 45 years of age, Sense of responsibility to family, Religious beliefs about death, Employed, Living with another person, especially a relative and Positive social support  Continued Clinical Symptoms:  Depression and psychotic illness both chronic  Cognitive Features That Contribute To Risk:  None    Suicide Risk:  Minimal: No identifiable suicidal ideation.  Patients presenting with no risk factors but with morbid ruminations; may be classified as minimal risk based on the severity of the depressive symptoms  Principal Problem: Schizophrenia Discharge Diagnoses:  Patient Active Problem List   Diagnosis Date Noted  . Hypotension [I95.9] 12/27/2014  . Syncopal episodes [R55] 12/27/2014  . Schizophrenia, unspecified type [F20.9]   . Schizophrenia [F20.9] 12/24/2014  . Tachycardia [R00.0] 12/23/2014      Plan Of Care/Follow-up recommendations:  Other:  f/u with outpt psychiatry  Is patient on multiple antipsychotic therapies at discharge:  No   Has Patient had three or more  failed trials of antipsychotic monotherapy by history:  No  Recommended Plan for Multiple Antipsychotic Therapies: NA    Jimmy FootmanHernandez-Gonzalez,  Charlot Gouin 01/01/2015, 8:08 AM

## 2015-01-02 ENCOUNTER — Ambulatory Visit: Payer: 59 | Admitting: Family Medicine

## 2015-02-13 ENCOUNTER — Ambulatory Visit (INDEPENDENT_AMBULATORY_CARE_PROVIDER_SITE_OTHER): Payer: 59 | Admitting: Psychiatry

## 2015-02-13 ENCOUNTER — Encounter (INDEPENDENT_AMBULATORY_CARE_PROVIDER_SITE_OTHER): Payer: Self-pay

## 2015-02-13 ENCOUNTER — Encounter (HOSPITAL_COMMUNITY): Payer: Self-pay | Admitting: Psychiatry

## 2015-02-13 DIAGNOSIS — F331 Major depressive disorder, recurrent, moderate: Secondary | ICD-10-CM

## 2015-02-13 DIAGNOSIS — F3340 Major depressive disorder, recurrent, in remission, unspecified: Secondary | ICD-10-CM | POA: Diagnosis not present

## 2015-02-13 MED ORDER — ESCITALOPRAM OXALATE 20 MG PO TABS: 20.0000 mg | ORAL_TABLET | Freq: Every day | ORAL | Status: DC

## 2015-02-13 MED ORDER — ESCITALOPRAM OXALATE 10 MG PO TABS: 10.0000 mg | ORAL_TABLET | Freq: Every day | ORAL | Status: DC

## 2015-02-13 NOTE — Progress Notes (Signed)
Psychiatric Initial Adult Assessment   Patient Identification: Alyssa Avila MRN:  [id number] Date of Evaluation:  02/13/2015 Referral Source: Molli Knock This patient is a 44 year old [place] female who seen with her husband. She is a mother of 3 children. She had a psychiatric hospitalization about 5 years ago at this facility but just recently was psychiatrically hospitalized in [place]. She was hospital was for 10 days has been out for over a month. Patient says she is doing great. Her husband agrees. As best I can tell she was admitted because she was depressed as her husband was in a few [place] and her son apparently had significant breathing problems. The patient was very distressed over this circumstance and a difficulty caring for her kids are herself. She couldn't sleep and generally she was not functioning. Her neighbors brought her to the hospital where she was promptly admitted for 10 days. Her husband came home and by the time he got home she actually been discharged and was doing much better. She continue taking a month supply of Zyprexa which she is out of now and a month Lexapro but she is also out of. At no point did she ever experience psychotic symptoms. At no point we she ever suicidal. The patient is been happily married for 20 years and feels she is in a good marriage. She has few other stresses. 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 She denies chest pain shortness of breath. She denies any neurological symptoms. The patient feels healthy. Both the patient and the husband asked if she could be off all medicines officially. Today the patient denies being suicidal. Her mood is great. Chief Complaint:   major depression recurrent remission Visit Diagnosis major depression recurrent remission Diagnosis:   Patient Active Problem List   Diagnosis Date Noted  . Schizoaffective disorder, bipolar type [F25.0] 01/01/2015  . Tachycardia [R00.0] 12/23/2014   History of Present  Illness:   Elements:   Associated Signs/Symptoms: Depression Symptoms:   (Hypo) Manic Symptoms:   Anxiety Symptoms:   Psychotic Symptoms:   PTSD Symptoms:   Past Medical History:  Past Medical History  Diagnosis Date  . Schizophrenia   . Depression    No past surgical history on file. Family History: No family history on file. Social History:   History   Social History  . Marital Status: Married    Spouse Name: N/A  . Number of Children: N/A  . Years of Education: N/A   Social History Main Topics  . Smoking status: Never Smoker   . Smokeless tobacco: Not on file  . Alcohol Use: No  . Drug Use: No  . Sexual Activity: Not on file   Other Topics Concern  . Not on file   Social History Narrative   Additional Social History:   Musculoskeletal: Strength & Muscle Tone: within normal limits Gait & Station: normal Patient leans: Right  Psychiatric Specialty Exam: HPI  ROS  There were no vitals taken for this visit.There is no weight on file to calculate BMI.  General Appearance: Casual  Eye Contact:  Good  Speech:  Clear and Coherent  Volume:  Normal  Mood:  Euthymic  Affect:  Blunt  Thought Process:  Coherent  Orientation:  Full (Time, Place, and Person)  Thought Content:  WDL  Suicidal Thoughts:  No  Homicidal Thoughts:  No  Memory:  NA  Judgement:  Good  Insight:  Good  Psychomotor Activity:  Normal  Concentration:  Fair  Recall:  Fair  Fund of Knowledge:Good  Language: Poor  Akathisia:  No  Handed:  Right  AIMS (if indicated):    Assets:    ADL's:  Intact  Cognition: WNL  Sleep:     Is the patient at risk to self?  No. Has the patient been a risk to self in the past 6 months?  No. Has the patient been a risk to self within the distant past?  No. Is the patient a risk to others?  No. Has the patient been a risk to others in the past 6 months?  No. Has the patient been a risk to others within the distant past?  No.  Allergies:  No Known  Allergies Current Medications: Current Outpatient Prescriptions  Medication Sig Dispense Refill  . diltiazem (CARDIZEM CD) 120 MG 24 hr capsule Take 1 capsule (120 mg total) by mouth daily. 30 capsule 0  . escitalopram (LEXAPRO) 10 MG tablet Take 1 tablet (10 mg total) by mouth at bedtime. 30 tablet 5  . escitalopram (LEXAPRO) 20 MG tablet Take 1 tablet (20 mg total) by mouth at bedtime. 30 tablet 3  . feeding supplement, ENSURE ENLIVE, (ENSURE ENLIVE) LIQD Take 237 mLs by mouth 3 (three) times daily between meals. 237 mL 12  . OLANZapine (ZYPREXA) 15 MG tablet Take 1 tablet (15 mg total) by mouth at bedtime. 30 tablet 0  . traZODone (DESYREL) 100 MG tablet Take 1 tablet (100 mg total) by mouth at bedtime as needed for sleep. 30 tablet 0   No current facility-administered medications for this visit.    Previous Psychotropic Medications: none  Substance Abuse History in the last 12 months:  No.  Consequences of Substance Abuse:   Medical Decision Making:    Treatment Plan Summary: At this time the patient will start back on Lexapro 10 mg. I'm only doing this until we can get more information about her hospitalization which apparently was at [facility]. At this time she is closely watched by her husband but actually the patient is doing fine. The patient and her husband say she is at her baseline. The patient is been off of all psychiatric snout over 10 days without a problem. She ran out of her medicine and went through no withdrawal or any problems from it. Apparently the patient had a significant period where she was dysfunctional probably because she was overwhelmed with stress the absence of her husband. He is now back from [place] and very supportive. Her son now is doing well as no one is sick at this time. The patient will begin 10 mg of Lexapro and return to see me in 7 weeks. The family was struck to that if any changes took place that they would call us right away.    Lucas Mallow 6/23/20163:47 PM

## 2015-03-19 ENCOUNTER — Encounter: Payer: Self-pay | Admitting: Family Medicine

## 2015-03-19 ENCOUNTER — Ambulatory Visit (INDEPENDENT_AMBULATORY_CARE_PROVIDER_SITE_OTHER): Payer: 59 | Admitting: Family Medicine

## 2015-03-19 ENCOUNTER — Ambulatory Visit (INDEPENDENT_AMBULATORY_CARE_PROVIDER_SITE_OTHER)
Admission: RE | Admit: 2015-03-19 | Discharge: 2015-03-19 | Disposition: A | Discharge status: Home / Self Care | Payer: 59 | Source: Ambulatory Visit | Attending: Family Medicine | Admitting: Family Medicine

## 2015-03-19 VITALS — BP 92/62 | HR 74 | Ht 65.0 in | Wt 138.0 lb

## 2015-03-19 DIAGNOSIS — M7500 Adhesive capsulitis of unspecified shoulder: Secondary | ICD-10-CM | POA: Insufficient documentation

## 2015-03-19 DIAGNOSIS — M25511 Pain in right shoulder: Secondary | ICD-10-CM

## 2015-03-19 DIAGNOSIS — M7501 Adhesive capsulitis of right shoulder: Secondary | ICD-10-CM | POA: Diagnosis not present

## 2015-03-19 NOTE — Progress Notes (Signed)
Pre visit review using our clinic review tool, if applicable. No additional management support is needed unless otherwise documented below in the visit note. 

## 2015-03-19 NOTE — Progress Notes (Signed)
[facility] [address] [place], [place] [address] Phone: [phone] Subjective:     CC: Neck and right shoulder pain  [id number] Alyssa Avila is a 44 y.o. female coming in with complaint of neck and shoulder pain. Patient was seen previously and was diagnosed with frozen shoulder. Patient was given home exercises which she did not do a regular basis and patient was given an injection during her visit. Patient was to do icing which she is not doing as well. Patient states that there has been minimal benefit. Some mild increase in range of motion. Patient states that she continues to have pain that seems to be centralized over the right shoulder and radiates towards her neck as well as somewhat down her arm from time to time. Denies any weakness or numbness. Patient states that it can wake her up at night still.   Patient did have cervical neck x-rays taken 11/03/2014. These were reviewed by me today and show patient does have some mild disc space narrowing of C5-C7 and mild narrowing of the right C5-6 foraminal.   Past Medical History  Diagnosis Date  . Schizophrenia   . Depression    No past surgical history on file. No family history on file. History  Substance Use Topics  . Smoking status: Never Smoker   . Smokeless tobacco: Not on file  . Alcohol Use: No   No Known Allergies  Past medical history, social, surgical and family history all reviewed in electronic medical record.   Review of Systems: No headache, visual changes, nausea, vomiting, diarrhea, constipation, dizziness, abdominal pain, skin rash, fevers, chills, night sweats, weight loss, swollen lymph nodes, body aches, joint swelling, muscle aches, chest pain, shortness of breath, mood changes.   Objective Blood pressure 92/62, pulse 74, height  (1.651 m), weight 138 lb (62.596 kg), last menstrual period 12/22/2014, SpO2 98 %.  General: No apparent distress alert and  oriented x3 mood and affect normal, dressed appropriately.  HEENT: Pupils equal, extraocular movements intact  Respiratory: Patient's speak in full sentences and does not appear short of breath  Cardiovascular: No lower extremity edema, non tender, no erythema  Skin: Warm dry intact with no signs of infection or rash on extremities or on axial skeleton.  Abdomen: Soft nontender  Neuro: Cranial nerves II through XII are intact, neurovascularly intact in all extremities with 2+ DTRs and 2+ pulses.  Lymph: No lymphadenopathy of posterior or anterior cervical chain or axillae bilaterally.  Gait normal with good balance and coordination.  MSK:  Non tender with full range of motion and good stability and symmetric strength and tone of shoulders, elbows, wrist, hip, knee and ankles bilaterally.  Neck: Inspection unremarkable. No palpable stepoffs. Negative Spurling's maneuver. Full neck range of motion Grip strength and sensation normal in bilateral hands Strength good C4 to T1 distribution No sensory change to C4 to T1 Negative Hoffman sign bilaterally Reflexes normal Shoulder: Right Inspection reveals no abnormalities, atrophy or asymmetry. Palpation is normal with no tenderness over AC joint or bicipital groove. Decreased range of motion with only 5 of external rotation and internal rotation to lateral hip but full forward flexion Rotator cuff strength normal throughout. signs of impingement with positive Neer and Hawkin's tests, but negative empty can sign. Speeds and Yergason's tests normal. No labral pathology noted with negative Obrien's, negative clunk and good stability. Normal scapular function observed. No painful arc and no drop arm sign. No apprehension sign  Impression and Recommendations:     This case required medical decision making of moderate complexity.

## 2015-03-19 NOTE — Assessment & Plan Note (Signed)
Patient has had some noncompliant issues. We discussed icing regimen, we discussed home exercises and patient will be sent to formal physical therapy. We discussed what activities to do on a regular basis and which ones to potentially avoid. We discussed the prognosis and patient would likely improve with time. Patient come back and see me again in 4 weeks for further evaluation. If continuing to have discomfort we may need to consider advanced imaging including MRI of the neck as well as shoulder. We will discuss at follow-up.

## 2015-03-19 NOTE — Patient Instructions (Signed)
Good to see you I am sorry you are still hurting Try the exercises still and physical therapy will be calling you Ice 20 minutes 2 times daily. Usually after activity and before bed. pennsaid pinkie amount topically 2 times daily as needed.  Call the pharmacy as well.  See me again in 3-4 weeks.

## 2015-03-27 ENCOUNTER — Other Ambulatory Visit: Payer: Self-pay | Admitting: *Deleted

## 2015-03-27 MED ORDER — DICLOFENAC SODIUM 2 % TD SOLN: TRANSDERMAL | Status: DC

## 2015-03-27 NOTE — Telephone Encounter (Signed)
pennsaid sent into [facility] in [place].

## 2015-04-10 ENCOUNTER — Encounter: Payer: Self-pay | Admitting: Family Medicine

## 2015-04-10 ENCOUNTER — Ambulatory Visit (INDEPENDENT_AMBULATORY_CARE_PROVIDER_SITE_OTHER): Payer: 59 | Admitting: Family Medicine

## 2015-04-10 VITALS — BP 92/64 | HR 75 | Ht 68.0 in | Wt 140.0 lb

## 2015-04-10 DIAGNOSIS — M25511 Pain in right shoulder: Secondary | ICD-10-CM | POA: Diagnosis not present

## 2015-04-10 DIAGNOSIS — M7501 Adhesive capsulitis of right shoulder: Secondary | ICD-10-CM | POA: Diagnosis not present

## 2015-04-10 NOTE — Progress Notes (Signed)
Pre visit review using our clinic review tool, if applicable. No additional management support is needed unless otherwise documented below in the visit note. 

## 2015-04-10 NOTE — Patient Instructions (Signed)
Good to see you I am sorry to hear that you are not better Lets get a MRI of the shoulder to make sure we are treating the right problem. Keep trying the exercises When you have the MRI see me again 1-2 days after the MRI.

## 2015-04-10 NOTE — Progress Notes (Signed)
  [facility] [address] [place], [place] [address] Phone: [phone] Subjective:     CC: Neck and right shoulder pain  [id number] Alyssa Avila is a 44 y.o. female coming in with complaint of neck and shoulder pain. Patient was seen previously and was diagnosed with frozen shoulder. Patient has had one sterile injection previously. Patient and forcefully worsen due to her being noncompliant. Encourage patient to be more active. Patient was suggested to go to formal physical therapy which patient declined. Patient states she still has not been doing the exercises regularly. Does not have time to do physical therapy. States that the pain is still there and continues to have difficulty actually with range of motion. Denies any numbness or tingling or any other radiation that was happening previously. Previous x-ray as a shoulders are unremarkable. Patient still states that this is affecting her daily activities and waking her up at night.   Patient did have cervical neck x-rays taken 11/03/2014. These were reviewed by me today and show patient does have some mild disc space narrowing of C5-C7 and mild narrowing of the right C5-6 foraminal. Shoulder x-rays show no arthritis.   Past Medical History  Diagnosis Date  . Schizophrenia   . Depression    No past surgical history on file. No family history on file. Social History  Substance Use Topics  . Smoking status: Never Smoker   . Smokeless tobacco: None  . Alcohol Use: No   No Known Allergies  Past medical history, social, surgical and family history all reviewed in electronic medical record.   Review of Systems: No headache, visual changes, nausea, vomiting, diarrhea, constipation, dizziness, abdominal pain, skin rash, fevers, chills, night sweats, weight loss, swollen lymph nodes, body aches, joint swelling, muscle aches, chest pain, shortness of breath, mood changes.   Objective Blood pressure  92/64, pulse 75, height  (1.727 m), weight 140 lb (63.504 kg), last menstrual period 12/22/2014, SpO2 99 %.  General: No apparent distress alert and oriented x3 mood and affect normal, dressed appropriately.  HEENT: Pupils equal, extraocular movements intact  Respiratory: Patient's speak in full sentences and does not appear short of breath  Cardiovascular: No lower extremity edema, non tender, no erythema  Skin: Warm dry intact with no signs of infection or rash on extremities or on axial skeleton.  Abdomen: Soft nontender  Neuro: Cranial nerves II through XII are intact, neurovascularly intact in all extremities with 2+ DTRs and 2+ pulses.  Lymph: No lymphadenopathy of posterior or anterior cervical chain or axillae bilaterally.  Gait normal with good balance and coordination.  MSK:  Non tender with full range of motion and good stability and symmetric strength and tone of shoulders, elbows, wrist, hip, knee and ankles bilaterally.   Shoulder: Right Inspection reveals no abnormalities, atrophy or asymmetry. Palpation is normal with no tenderness over AC joint or bicipital groove. Decreased range of motion with only 5 of external rotation and internal rotation to lateral hip and worsening forward flexion from previous exam Rotator cuff strength normal throughout. signs of impingement with positive Neer and Hawkin's tests, but negative empty can sign. Speeds and Yergason's tests normal. No labral pathology noted with negative Obrien's, negative clunk and good stability. Normal scapular function observed. No painful arc and no drop arm sign. No apprehension sign    Impression and Recommendations:     This case required medical decision making of moderate complexity.

## 2015-04-10 NOTE — Assessment & Plan Note (Signed)
Patient continues to have signs and symptoms that is more consistent with a frozen shoulder. We discussed the possibility of the formal physical therapy again. Patient states though that this is affecting all daily activities as well as her job. At this time and advance imaging could be warranted. MRI arthrogram ordered. Discuss at this time to continue all other conservative therapy and we went over in great detail. Patient come back 1-2 days after the MRI and we'll discuss in further detail.  Spent  25 minutes with patient face-to-face and had greater than 50% of counseling including as described above in assessment and plan.

## 2015-04-23 ENCOUNTER — Other Ambulatory Visit (HOSPITAL_COMMUNITY): Payer: Self-pay | Admitting: Psychiatry

## 2015-04-23 ENCOUNTER — Ambulatory Visit (INDEPENDENT_AMBULATORY_CARE_PROVIDER_SITE_OTHER): Payer: 59 | Admitting: Psychiatry

## 2015-04-23 VITALS — BP 104/70 | HR 66 | Ht 63.0 in | Wt 140.0 lb

## 2015-04-23 DIAGNOSIS — F331 Major depressive disorder, recurrent, moderate: Secondary | ICD-10-CM

## 2015-04-23 DIAGNOSIS — F3340 Major depressive disorder, recurrent, in remission, unspecified: Secondary | ICD-10-CM

## 2015-04-23 MED ORDER — ESCITALOPRAM OXALATE 10 MG PO TABS: 10.0000 mg | ORAL_TABLET | Freq: Every day | ORAL | Status: DC

## 2015-04-23 NOTE — Progress Notes (Signed)
[facility] MD Progress Note  04/23/2015 4:24 PM Alyssa Avila  MRN:  [id number] Subjective:  Feels fine Principal Problem: Major depression recurrent, in remission Diagnosis:  Major depression recurrent, in remission Today the patient is seen with her husband. The patient is doing very well. She does takes 10 mg of Lexapro. The patient denies depression. She sleeping and eating well. She's got good energy. She is taking care of her small son without problems. The patient is organize connected concentrates well. She denies worthlessness. She denies being suicidal. Her thoughts are clear. The patient has no evidence of psychosis. She's not suicidal. She isn't had no recurrent symptoms of anxiety or depression despite the fact that she's off Zyprexa and on a low-dose of Lexapro. Interview the patient does describe a past episode 10 years ago where she had persistent depression and other vegetative symptoms. At that time she was treated with antidepressive. At this time the patient seems to completely recovered. In retrospect she clearly was stressed out with the absence of her husband while her young son was ill. The patient knows that she'll make a better attempt to get supports around her when her husband is out of the country. At this time though the patient is back to her baseline. Patient Active Problem List   Diagnosis Date Noted  . 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 Schizoaffective disorder, bipolar type [F25.0] 01/01/2015  . Tachycardia [R00.0] 12/23/2014   Total Time spent with patient: 15 minutes   Past Medical History:  Past Medical History  Diagnosis Date  . Schizophrenia   . Depression    No past surgical history on file. Family History: No family history on file. Social History:  History  Alcohol Use No     History  Drug Use No    Social History   Social History  . Marital Status: Married    Spouse Name: N/A  . Number of Children: N/A  . Years of Education:  N/A   Social History Main Topics  . Smoking status: Never Smoker   . Smokeless tobacco: Not on file  . Alcohol Use: No  . Drug Use: No  . Sexual Activity: Not on file   Other Topics Concern  . Not on file   Social History Narrative   Additional History:    Sleep: Good  Appetite:  Good   Assessment:   Musculoskeletal: Strength & Muscle Tone: within normal limits Gait & Station: normal Patient leans: Right   Psychiatric Specialty Exam: Physical Exam  ROS  Blood pressure 104/70, pulse 66, height  (1.6 m), weight 140 lb (63.504 kg).Body mass index is 24.81 kg/(m^2).  General Appearance: Negative and NA  Eye Contact::  Good  Speech:  Clear and Coherent  Volume:  Normal  Mood:  NA  Affect:  Congruent  Thought Process:  Coherent  Orientation:  Full (Time, Place, and Person)  Thought Content:  WDL  Suicidal Thoughts:  No  Homicidal Thoughts:  No  Memory:  NA  Judgement:  Good  Insight:  Good  Psychomotor Activity:  Decreased  Concentration:  Good  Recall:  Good  Fund of Knowledge:Good  Language: Fair  Akathisia:  No  Handed:  Right  AIMS (if indicated):     Assets:  Communication Skills  ADL's:  Intact  Cognition: WNL  Sleep:        Current Medications: Current Outpatient Prescriptions  Medication Sig Dispense Refill  . Diclofenac Sodium 2 % SOLN Apply 1 pump twice  daily. 112 g 3  . diltiazem (CARDIZEM CD) 120 MG 24 hr capsule Take 1 capsule (120 mg total) by mouth daily. 30 capsule 0  . escitalopram (LEXAPRO) 10 MG tablet Take 1 tablet (10 mg total) by mouth at bedtime. 30 tablet 5  . escitalopram (LEXAPRO) 20 MG tablet Take 1 tablet (20 mg total) by mouth at bedtime. 30 tablet 3  . feeding supplement, ENSURE ENLIVE, (ENSURE ENLIVE) LIQD Take 237 mLs by mouth 3 (three) times daily between meals. 237 mL 12  . OLANZapine (ZYPREXA) 15 MG tablet Take 1 tablet (15 mg total) by mouth at bedtime. 30 tablet 0  . traZODone (DESYREL) 100 MG tablet Take 1  tablet (100 mg total) by mouth at bedtime as needed for sleep. 30 tablet 0   No current facility-administered medications for this visit.    Lab Results: No results found for this or any previous visit (from the past 48 hour(s)).  Physical Findings: AIMS:  , ,  ,  ,    CIWA:    COWS:     Treatment Plan Summary: At this time the patient is agreed to continue taking Lexapro 10 mg once a day for the next 3 months. She'll return in 3 months and at that time if she's well we will discontinue the Lexapro. The patient is stable doing well plan to roll his wife and mother. The patient shows no evidence of psychosis. The patient denies chest pain shortness of breath or any other neurological symptoms at this time. I believe she is very stable back to her baseline.     Medical Decision Making:  Self-Limited or Minor (1)     Phinehas Grounds IRVING 04/23/2015, 4:24 PM

## 2015-05-19 ENCOUNTER — Ambulatory Visit
Admission: RE | Admit: 2015-05-19 | Discharge: 2015-05-19 | Disposition: A | Discharge status: Home / Self Care | Payer: 59 | Source: Ambulatory Visit | Attending: Family Medicine | Admitting: Family Medicine

## 2015-05-19 DIAGNOSIS — M25511 Pain in right shoulder: Secondary | ICD-10-CM

## 2015-05-19 MED ORDER — IOHEXOL 180 MG/ML  SOLN
15.0000 mL | Freq: Once | INTRAMUSCULAR | Status: DC | PRN
  Administered 2015-05-19: 15 mL via INTRA_ARTICULAR

## 2015-05-26 ENCOUNTER — Other Ambulatory Visit: Payer: Self-pay

## 2015-05-26 ENCOUNTER — Telehealth: Payer: Self-pay | Admitting: Family Medicine

## 2015-05-26 DIAGNOSIS — M25511 Pain in right shoulder: Secondary | ICD-10-CM

## 2015-05-26 NOTE — Telephone Encounter (Signed)
MRI results

## 2015-05-26 NOTE — Telephone Encounter (Signed)
Please call patient. Follow up on MRI

## 2015-05-26 NOTE — Telephone Encounter (Signed)
Would like referral to shoulder specialist since she is still having a great deal of pain

## 2015-05-26 NOTE — Telephone Encounter (Signed)
Appears that the rotator cuff is doing fairly well but there is a torn ligament. Does make shoulder somewhat unstable. We can either continue conservative therapy or she can discuss with a shoulder specialist if she would consider surgery.

## 2015-06-09 ENCOUNTER — Ambulatory Visit: Payer: Self-pay | Admitting: Internal Medicine

## 2015-06-18 ENCOUNTER — Encounter: Payer: Self-pay | Admitting: Internal Medicine

## 2015-06-18 ENCOUNTER — Ambulatory Visit (INDEPENDENT_AMBULATORY_CARE_PROVIDER_SITE_OTHER): Payer: 59 | Admitting: Internal Medicine

## 2015-06-18 VITALS — BP 104/70 | HR 66 | Temp 98.1°F | Resp 16 | Wt 143.0 lb

## 2015-06-18 DIAGNOSIS — M25511 Pain in right shoulder: Secondary | ICD-10-CM | POA: Diagnosis not present

## 2015-06-18 DIAGNOSIS — K5900 Constipation, unspecified: Secondary | ICD-10-CM | POA: Diagnosis not present

## 2015-06-18 DIAGNOSIS — F250 Schizoaffective disorder, bipolar type: Secondary | ICD-10-CM

## 2015-06-18 MED ORDER — MELOXICAM 15 MG PO TABS: 15.0000 mg | ORAL_TABLET | Freq: Every day | ORAL | Status: DC

## 2015-06-18 NOTE — Patient Instructions (Addendum)
We have reviewed your prior records including labs and tests today.   Medications reviewed and updated.  No changes recommended in prescription medications at this time.  You should try starting an over-the-counter stool softener such as colace.  You should also increase your water intake and exercise regularly.  If your constipation does not improve please return.   A referral was ordered for orthopedics for your right shoulder pain.  Please followup as needed       Constipation, Adult Constipation is when a person has fewer than three bowel movements a week, has difficulty having a bowel movement, or has stools that are dry, hard, or larger than normal. As people grow older, constipation is more common. A low-fiber diet, not taking in enough fluids, and taking certain medicines may make constipation worse.  CAUSES   Certain medicines, such as antidepressants, pain medicine, iron supplements, antacids, and water pills.   Certain diseases, such as diabetes, irritable bowel syndrome (IBS), thyroid disease, or depression.   Not drinking enough water.   Not eating enough fiber-rich foods.   Stress or travel.   Lack of physical activity or exercise.   Ignoring the urge to have a bowel movement.   Using laxatives too much.  SIGNS AND SYMPTOMS   Having fewer than three bowel movements a week.   Straining to have a bowel movement.   Having stools that are hard, dry, or larger than normal.   Feeling full or bloated.   Pain in the lower abdomen.   Not feeling relief after having a bowel movement.  DIAGNOSIS  Your health care provider will take a medical history and perform a physical exam. Further testing may be done for severe constipation. Some tests may include:  A barium enema X-ray to examine your rectum, colon, and, sometimes, your small intestine.   A sigmoidoscopy to examine your lower colon.   A colonoscopy to examine your entire  colon. TREATMENT  Treatment will depend on the severity of your constipation and what is causing it. Some dietary treatments include drinking more fluids and eating more fiber-rich foods. Lifestyle treatments may include regular exercise. If these diet and lifestyle recommendations do not help, your health care provider may recommend taking over-the-counter laxative medicines to help you have bowel movements. Prescription medicines may be prescribed if over-the-counter medicines do not work.  HOME CARE INSTRUCTIONS   Eat foods that have a lot of fiber, such as fruits, vegetables, whole grains, and beans.  Limit foods high in fat and processed sugars, such as french fries, hamburgers, cookies, candies, and soda.   A fiber supplement may be added to your diet if you cannot get enough fiber from foods.   Drink enough fluids to keep your urine clear or pale yellow.   Exercise regularly or as directed by your health care provider.   Go to the restroom when you have the urge to go. Do not hold it.   Only take over-the-counter or prescription medicines as directed by your health care provider. Do not take other medicines for constipation without talking to your health care provider first.  SEEK IMMEDIATE MEDICAL CARE IF:   You have bright red blood in your stool.   Your constipation lasts for more than 4 days or gets worse.   You have abdominal or rectal pain.   You have thin, pencil-like stools.   You have unexplained weight loss. MAKE SURE YOU:   Understand these instructions.  Will watch your condition.  Will  get help right away if you are not doing well or get worse.   This information is not intended to replace advice given to you by your health care provider. Make sure you discuss any questions you have with your health care provider.   Document Released: 05/07/2004 Document Revised: 08/30/2014 Document Reviewed: 05/21/2013 Elsevier Interactive Patient Education [id number]  Elsevier Inc.

## 2015-06-18 NOTE — Progress Notes (Signed)
Pre visit review using our clinic review tool, if applicable. No additional management support is needed unless otherwise documented below in the visit note. 

## 2015-06-18 NOTE — Progress Notes (Signed)
Subjective:    Patient ID: Alyssa Avila, female    DOB: 1970-08-29, 44 y.o.   MRN: [id number]  HPI She is here to establish with a new pcp. She has concerns regarding right shoulder pain.  Shoulder pain, right:  She is still having right shoulder pain.  She is seeing Dr. Katrinka Blazing for this and had x-rays and an MRI. He recommended physical therapy, but she declined. He did try to refer her to orthopedics for further evaluation, but she did not have a primary care physician in the referral was not able to go through. She would like a referral today. She has significant pain in the shoulder and pain that radiates down the arm. She has decreased range of motion of the shoulder and the arm feels weak. She denies any numbness or tingling in the arm and denies weakness in the hand. he is taking Aleve for the pain, but it does not help much.  She currently works at [employer].  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 Work told her if they provided a note they can reassign her.  She can do light duty, but has difficulty and increased pain with any lifting, carrying or pushing with her right arm.    Schizoaffective d/o, bipoarl type:  She is following with a psychiatrist. she is on medication and taking it daily. She currently feels that her moods are controlled.  Constipation:   She has a long history of constipation. She can sometimes go two weeks without a bowel movement. She denies any abdominal pain or discomfort. She does not exercise regularly and does not drink much water throughout the day. Her diet varies and sometimes she does not have a high fiber diet. She denies any blood in stool. She has never had a colonoscopy.   Medications and allergies reviewed with patient and updated if appropriate.  Patient Active Problem List   Diagnosis Date Noted  . 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 Schizoaffective disorder, bipolar type (HCC) 01/01/2015  .  Tachycardia 12/23/2014    Past Medical History  Diagnosis Date  . Schizophrenia (HCC)   . Depression     History reviewed. No pertinent past surgical history.  Social History   Social History  . Marital Status: Married    Spouse Name: N/A  . Number of Children: N/A  . Years of Education: N/A   Social History Main Topics  . Smoking status: Never Smoker   . Smokeless tobacco: None  . Alcohol Use: No  . Drug Use: No  . Sexual Activity: Not Asked   Other Topics Concern  . None   Social History Narrative    Review of Systems  Constitutional: Negative for fever, chills and appetite change.  Respiratory: Negative for cough, shortness of breath and wheezing.   Cardiovascular: Negative.   Gastrointestinal: Positive for constipation. Negative for abdominal pain and blood in stool.  Genitourinary: Negative for dysuria and hematuria.  Musculoskeletal: Positive for arthralgias (rigtht shoulder). Negative for back pain.  Neurological: Positive for weakness (in right shoulder, no right hand) and headaches (ocsaional). Negative for light-headedness and numbness.       Objective:   Filed Vitals:   06/18/15 1502  BP: 104/70  Pulse: 66  Temp: 98.1 F (36.7 C)  Resp: 16   Filed Weights   06/18/15 1502  Weight: 143 lb (64.864 kg)   Body mass index is 25.34 kg/(m^2).   Physical Exam  Constitutional: She appears well-developed and well-nourished.  HENT:  Head: Normocephalic and atraumatic.  Right Ear: External ear normal.  Left Ear: External ear normal.  Mouth/Throat: Oropharynx is clear and moist.  Eyes: Conjunctivae are normal.  Neck: Neck supple. No tracheal deviation present. No thyromegaly present.  No carotid bruit  Cardiovascular: Normal rate, regular rhythm, normal heart sounds and intact distal pulses.   No murmur heard. Pulmonary/Chest: Effort normal and breath sounds normal. No respiratory distress. She has no wheezes.  Abdominal: Soft. She exhibits no  distension. There is no tenderness.  Musculoskeletal: She exhibits no edema.  No right shoulder deformity or obvious effusion., Pain with palpation around shoulder joint, decreased range of motion associated with pain  Lymphadenopathy:    She has no cervical adenopathy.  Neurological:  Normal sensation of right arm, normal hand strength  Psychiatric: She has a normal mood and affect. Her behavior is normal.          Assessment & Plan:   See problem list for full assessment and plan

## 2015-06-18 NOTE — Assessment & Plan Note (Signed)
Following with psychiatry.

## 2015-06-18 NOTE — Assessment & Plan Note (Addendum)
Chronic N.ot associated with any other concerning symptoms such as abdominal pain and blood in stool Advised lifestyle changes-increased water intake, increase exercise and increase high-fiber foods Try taking a stool softener daily or adding a fiber supplement Information provided Return if no improvement

## 2015-07-23 ENCOUNTER — Ambulatory Visit (INDEPENDENT_AMBULATORY_CARE_PROVIDER_SITE_OTHER): Payer: 59 | Admitting: Psychiatry

## 2015-07-23 ENCOUNTER — Encounter (HOSPITAL_COMMUNITY): Payer: Self-pay | Admitting: Psychiatry

## 2015-07-23 VITALS — BP 102/74 | HR 75 | Ht 63.0 in | Wt 144.6 lb

## 2015-07-23 DIAGNOSIS — F3342 Major depressive disorder, recurrent, in full remission: Secondary | ICD-10-CM

## 2015-07-23 DIAGNOSIS — F331 Major depressive disorder, recurrent, moderate: Secondary | ICD-10-CM

## 2015-07-23 NOTE — Progress Notes (Signed)
[facility] MD Progress Note  07/23/2015 2:57 PM Alyssa Avila  MRN:  [id number] Subjective:  Happy and functioning well Principal Problem: Major depression recurrent remission  Diagnosis: Major depression recurrent remission At this time the patient is doing very well. She seen with her husband. 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 She's taking care of her children and shows no evidence of depression. She is a very good cook. She is sleeping and eating well. The patient is very functionable. Her husband says she is doing very well. We reviewed that she had a distant here history of major depression over a decade ago when she responded to antidepressants. She essentially has been free of symptoms up until this year when due to the circumstances of her husband being out of town and her child getting sick she was overwhelmed. She was hospitalized and treated with Zyprexa and Lexapro. Since I've known her she slowly come all the Zyprexa without any problems. The patient is doing extremely well is active functioning she likes movies and continues doing all that she needs to do. She has young child who is in second grade and is doing very well. Her husband says she's back to her baseline. At this time we had a discussion of the pros and cons of continuing her Lexapro. Her wish and her husband's is to discontinue it if she doesn't needed. I agree with this perception and the patient is under agreement that if she stops the medicine and she has any problems she'll make contact with [place] immediately. She's only one 10 mg. Patient Active Problem List   Diagnosis Date Noted  . Constipation [K59.00] 06/18/2015  . Right shoulder pain [M25.511] 06/18/2015  . 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 Schizoaffective disorder, bipolar type (HCC) [F25.0] 01/01/2015   Total Time spent with patient: 30 minutes  Past Psychiatric History:   Past Medical History:  Past  Medical History  Diagnosis Date  . Schizophrenia (HCC)   . Depression    No past surgical history on file. Family History: No family history on file. Family Psychiatric  History:  Social History:  History  Alcohol Use No     History  Drug Use No    Social History   Social History  . Marital Status: Married    Spouse Name: N/A  . Number of Children: N/A  . Years of Education: N/A   Social History Main Topics  . Smoking status: Never Smoker   . Smokeless tobacco: Never Used  . Alcohol Use: No  . Drug Use: No  . Sexual Activity: Not Asked   Other Topics Concern  . None   Social History Narrative   Additional Social History:                         Sleep: Good  Appetite:  Good  Current Medications: Current Outpatient Prescriptions  Medication Sig Dispense Refill  . escitalopram (LEXAPRO) 20 MG tablet   0  . meloxicam (MOBIC) 15 MG tablet Take 1 tablet (15 mg total) by mouth daily. 30 tablet 2  . PENNSAID 2 % SOLN aPPLY ONE pump TO THE affected AREA TWICE daily as directed  3   No current facility-administered medications for this visit.    Lab Results: No results found for this or any previous visit (from the past 48 hour(s)).  Physical Findings: AIMS:  , ,  ,  ,  CIWA:    COWS:     Musculoskeletal: Strength & Muscle Tone: within normal limits Gait & Station: normal Patient leans: Right  Psychiatric Specialty Exam: ROS  Blood pressure 102/74, pulse 75, height 5\' 3"  (1.6 m), weight 144 lb 9.6 oz (65.59 kg).Body mass index is 25.62 kg/(m^2).  General Appearance: Casual  Eye Contact::  Good  Speech:  Clear and Coherent  Volume:  Normal  Mood:  Euthymic  Affect:  Appropriate  Thought Process:  Coherent  Orientation:  NA  Thought Content:  WDL  Suicidal Thoughts:  No  Homicidal Thoughts:  No  Memory:  NA  Judgement:  Good  Insight:  Good  Psychomotor Activity:  Normal  Concentration:  Good  Recall:  Good  Fund of Knowledg    Language: Good  Akathisia:  No  Handed:  Right  AIMS (if indicated):     Assets:  Desire for Improvement  ADL's:  Intact  Cognition: WNL  Sleep:      Treatment Plan Summary: At this time this patient is at her baseline and shows no evidence of clinical depression. It is my wish and the patient's wish that she go ahead and discontinue her 10 mg of Lexapro. The patient agreed that if any changes occur should make contact with us immediately. She'll also return to see me in 5 months for our last potential. Her husband is increased complete agreement with this plan. The patient is not suicidal. She's physically very healthy. She's functioning optimally.  Deysi Soldo IRVING 07/23/2015, 2:57 PM

## 2015-12-08 ENCOUNTER — Emergency Department (HOSPITAL_COMMUNITY)
Admission: EM | Admit: 2015-12-08 | Discharge: 2015-12-08 | Disposition: A | Discharge status: Other Acute Inpatient Hospital | Payer: BLUE CROSS/BLUE SHIELD | Attending: Emergency Medicine | Admitting: Emergency Medicine

## 2015-12-08 ENCOUNTER — Encounter (HOSPITAL_COMMUNITY): Payer: Self-pay

## 2015-12-08 ENCOUNTER — Inpatient Hospital Stay (HOSPITAL_COMMUNITY)
Admission: RE | Admit: 2015-12-08 | Discharge: 2015-12-09 | DRG: 885 | Disposition: A | Discharge status: Home / Self Care | Payer: BLUE CROSS/BLUE SHIELD | Attending: Emergency Medicine | Admitting: Emergency Medicine

## 2015-12-08 DIAGNOSIS — Z791 Long term (current) use of non-steroidal anti-inflammatories (NSAID): Secondary | ICD-10-CM | POA: Insufficient documentation

## 2015-12-08 DIAGNOSIS — R000 Tachycardia, unspecified: Secondary | ICD-10-CM | POA: Diagnosis present

## 2015-12-08 DIAGNOSIS — F419 Anxiety disorder, unspecified: Secondary | ICD-10-CM | POA: Insufficient documentation

## 2015-12-08 DIAGNOSIS — Z3202 Encounter for pregnancy test, result negative: Secondary | ICD-10-CM | POA: Diagnosis not present

## 2015-12-08 DIAGNOSIS — F061 Catatonic disorder due to known physiological condition: Secondary | ICD-10-CM | POA: Diagnosis present

## 2015-12-08 DIAGNOSIS — F259 Schizoaffective disorder, unspecified: Secondary | ICD-10-CM

## 2015-12-08 DIAGNOSIS — F209 Schizophrenia, unspecified: Secondary | ICD-10-CM | POA: Diagnosis present

## 2015-12-08 DIAGNOSIS — F339 Major depressive disorder, recurrent, unspecified: Secondary | ICD-10-CM | POA: Diagnosis not present

## 2015-12-08 DIAGNOSIS — F332 Major depressive disorder, recurrent severe without psychotic features: Secondary | ICD-10-CM | POA: Diagnosis present

## 2015-12-08 DIAGNOSIS — F333 Major depressive disorder, recurrent, severe with psychotic symptoms: Secondary | ICD-10-CM | POA: Diagnosis present

## 2015-12-08 DIAGNOSIS — F250 Schizoaffective disorder, bipolar type: Secondary | ICD-10-CM | POA: Diagnosis present

## 2015-12-08 DIAGNOSIS — Z008 Encounter for other general examination: Secondary | ICD-10-CM | POA: Diagnosis present

## 2015-12-08 LAB — COMPREHENSIVE METABOLIC PANEL
ALBUMIN: 4.9 g/dL (ref 3.5–5.0)
ALT: 15 U/L (ref 14–54)
ANION GAP: 9 (ref 5–15)
AST: 18 U/L (ref 15–41)
Alkaline Phosphatase: 38 U/L (ref 38–126)
BILIRUBIN TOTAL: 0.8 mg/dL (ref 0.3–1.2)
BUN: 13 mg/dL (ref 6–20)
CO2: 23 mmol/L (ref 22–32)
Calcium: 9.2 mg/dL (ref 8.9–10.3)
Chloride: 110 mmol/L (ref 101–111)
Creatinine, Ser: 0.69 mg/dL (ref 0.44–1.00)
GFR calc Af Amer: >60 mL/min (ref >60)
GFR calc non Af Amer: >60 mL/min (ref >60)
GLUCOSE: 107 mg/dL — AB (ref 65–99)
POTASSIUM: 3.4 mmol/L — AB (ref 3.5–5.1)
Sodium: 142 mmol/L (ref 135–145)
TOTAL PROTEIN: 8.1 g/dL (ref 6.5–8.1)

## 2015-12-08 LAB — I-STAT BETA HCG BLOOD, ED (MC, WL, AP ONLY)

## 2015-12-08 LAB — CBC
HCT: 40.1 % (ref 36.0–46.0)
HEMOGLOBIN: 13.9 g/dL (ref 12.0–15.0)
MCH: 28.7 pg (ref 26.0–34.0)
MCHC: 34.7 g/dL (ref 30.0–36.0)
MCV: 82.9 fL (ref 78.0–100.0)
Platelets: 296 10*3/uL (ref 150–400)
RBC: 4.84 MIL/uL (ref 3.87–5.11)
RDW: 13.1 % (ref 11.5–15.5)
WBC: 4.9 10*3/uL (ref 4.0–10.5)

## 2015-12-08 LAB — SALICYLATE LEVEL: Salicylate Lvl: <4 mg/dL (ref 2.8–30.0)

## 2015-12-08 LAB — ACETAMINOPHEN LEVEL

## 2015-12-08 LAB — ETHANOL

## 2015-12-08 NOTE — BH Assessment (Addendum)
Assessment Note  Alyssa Avila is an 45 y.o. female who presents voluntarily to [facility] as a walk-in accompanied by her husband, Mesfin Haile. Pt presented as slightly catatonic, just staring ahead with a flat affect and apathetic mood. Her primary language is Amharic, but it was apparent that she was experiencing some type of psychiatric crisis so assessment information obtained from husband. Pt's husband shared that pt d/c her Lexapro in December (it was an agreed upon decision, per notes from Dr. Donell Beers in 06/2015) and was doing fine until this past Thursday (12/04/15), when she became "confused, not speaking, not eating, not sleeping, worried about everything". Husband added that he has had to urge and assist pt to complete her ADLs.    Diagnosis: MDD, recurrent episode, severe, with catatonic features  Past Medical History:  Past Medical History  Diagnosis Date  . Schizophrenia (HCC)   . Depression     No past surgical history on file.  Family History: No family history on file.  Social History:  reports that she has never smoked. She has never used smokeless tobacco. She reports that she does not drink alcohol or use illicit drugs.  Additional Social History:  Alcohol / Drug Use Pain Medications: none noted Prescriptions: pt was taking Lexapro, but d/c in December 2016 Over the Counter: none noted History of alcohol / drug use?: No history of alcohol / drug abuse  CIWA:   COWS:    Allergies: No Known Allergies  Home Medications:  (Not in a hospital admission)  OB/GYN Status:  No LMP recorded.  General Assessment Data Location of Assessment: [facility] Assessment Services TTS Assessment: In system Is this a Tele or Face-to-Face Assessment?: Face-to-Face Is this an Initial Assessment or a Re-assessment for this encounter?: Initial Assessment Marital status: Married Is patient pregnant?: No Pregnancy Status: No Living Arrangements: Spouse/significant other, Children Can pt  return to current living arrangement?: Yes Admission Status: Voluntary Is patient capable of signing voluntary admission?: Yes Referral Source: Self/Family/Friend Insurance type: BCBS  Medical Screening Exam [facility] Walk-in ONLY) Medical Exam completed: No Reason for MSE not completed: Other: (pt going to [facility] for med clearance)  Crisis Care Plan Living Arrangements: Spouse/significant other, Children Name of Psychiatrist: Archer Asa Name of Therapist: none  Education Status Is patient currently in school?: No  Risk to self with the past 6 months Suicidal Ideation: No Has patient been a risk to self within the past 6 months prior to admission? : No Suicidal Intent: No Has patient had any suicidal intent within the past 6 months prior to admission? : No Is patient at risk for suicide?: No Suicidal Plan?: No Has patient had any suicidal plan within the past 6 months prior to admission? : No Access to Means: No What has been your use of drugs/alcohol within the last 12 months?: pt denies Previous Attempts/Gestures: No How many times?: 0 Other Self Harm Risks: 0 Triggers for Past Attempts: Other (Comment) (no past attempts) Intentional Self Injurious Behavior: None Family Suicide History: Unknown Recent stressful life event(s): Other (Comment) (d/c Lexapro in December 2016) Persecutory voices/beliefs?: No Depression: Yes Depression Symptoms: Insomnia, Loss of interest in usual pleasures Substance abuse history and/or treatment for substance abuse?: No Suicide prevention information given to non-admitted patients: Not applicable  Risk to Others within the past 6 months Homicidal Ideation: No Does patient have any lifetime risk of violence toward others beyond the six months prior to admission? : No Thoughts of Harm to Others: No Current Homicidal Intent:  No Current Homicidal Plan: No Access to Homicidal Means: No History of harm to others?: No Assessment of Violence: None  Noted Violent Behavior Description: none noted Does patient have access to weapons?: No Criminal Charges Pending?: No Does patient have a court date: No Is patient on probation?: No  Psychosis Hallucinations: None noted Delusions: None noted  Mental Status Report Appearance/Hygiene: Unremarkable Eye Contact: Fair Motor Activity: Unremarkable Speech: Other (Comment) (mutism (not necessarily elective)) Level of Consciousness: Quiet/awake Mood: Apathetic Affect: Appropriate to circumstance, Flat Anxiety Level: None Thought Processes: Unable to Assess Judgement: Unable to Assess Orientation: Unable to assess Obsessive Compulsive Thoughts/Behaviors: Unable to Assess  Cognitive Functioning Concentration: Unable to Assess Memory: Unable to Assess IQ: Average Insight: Unable to Assess Impulse Control: Unable to Assess Appetite: Poor Sleep: Decreased Total Hours of Sleep: 0 Vegetative Symptoms: Staying in bed, Decreased grooming  ADLScreening [facility] Assessment Services) Patient's cognitive ability adequate to safely complete daily activities?: Yes Patient able to express need for assistance with ADLs?: Yes Independently performs ADLs?: Yes (appropriate for developmental age)  Prior Inpatient Therapy Prior Inpatient Therapy: Yes Prior Therapy Dates: 2009; 2010; 2016 [facility] Reason for Treatment: MDD  Prior Outpatient Therapy Prior Outpatient Therapy: Yes Prior Therapy Dates: current Does patient have an ACCT team?: No Does patient have Intensive In-House Services?  : No Does patient have [facility] services? : No Does patient have P4CC services?: No  ADL Screening (condition at time of admission) Patient's cognitive ability adequate to safely complete daily activities?: Yes Is the patient deaf or have difficulty hearing?: No Does the patient have difficulty seeing, even when wearing glasses/contacts?: No Does the patient have difficulty  concentrating, remembering, or making decisions?: Yes Patient able to express need for assistance with ADLs?: Yes Does the patient have difficulty dressing or bathing?: No Independently performs ADLs?: Yes (appropriate for developmental age) Does the patient have difficulty walking or climbing stairs?: No Weakness of Legs: None  Home Assistive Devices/Equipment Home Assistive Devices/Equipment: None  Therapy Consults (therapy consults require a physician order) PT Evaluation Needed: No OT Evalulation Needed: No SLP Evaluation Needed: No Abuse/Neglect Assessment (Assessment to be complete while patient is alone) Physical Abuse: Denies Verbal Abuse: Denies Sexual Abuse: Denies Exploitation of patient/patient's resources: Denies Self-Neglect: Denies Values / Beliefs Cultural Requests During Hospitalization: None Spiritual Requests During Hospitalization: None Consults Spiritual Care Consult Needed: No Social Work Consult Needed: No Merchant navy officerAdvance Directives (For Healthcare) Does patient have an advance directive?: No Would patient like information on creating an advanced directive?: No - patient declined information    Additional Information 1:1 In Past 12 Months?: No CIRT Risk: No Elopement Risk: No Does patient have medical clearance?: No     Disposition:  Disposition Initial Assessment Completed for this Encounter: Yes Disposition of Patient: Inpatient treatment program (consulted with Serena ColonelAggie Nwoko, NP) Type of inpatient treatment program: Adult (TTS to seek placement)  On Site Evaluation by:   Reviewed with Physician:    Laddie AquasSamantha M Denissa Cozart 12/08/2015 9:57 AM

## 2015-12-08 NOTE — ED Notes (Signed)
Pt has already been assessed by TTS.

## 2015-12-08 NOTE — ED Notes (Signed)
Patient presents very flat, withdrawn. Not eating. Spouse reports she stopped eating around Friday. Speaks at a whisper keeping face and mouth covered.   Encouragement offered. Q 15 safety checks in place.

## 2015-12-08 NOTE — Tx Team (Signed)
Initial Interdisciplinary Treatment Plan   PATIENT STRESSORS: Medication change or noncompliance Not eating or drinking   PATIENT STRENGTHS: Supportive family/friends   PROBLEM LIST: Problem List/Patient Goals Date to be addressed Date deferred Reason deferred Estimated date of resolution  Depression 12/08/15     Catatonic  12/08/15     Inability to care for herself/ADL's 12/08/15           Pt is catatonic and is unable to state her goals for treatment                               DISCHARGE CRITERIA:  Improved stabilization in mood, thinking, and/or behavior Medical problems require only outpatient monitoring Motivation to continue treatment in a less acute level of care Verbal commitment to aftercare and medication compliance  PRELIMINARY DISCHARGE PLAN: Outpatient therapy Medication management  PATIENT/FAMIILY INVOLVEMENT: This treatment plan has been presented to and reviewed with the patient, Alyssa Avila.  The patient and family have been given the opportunity to ask questions and make suggestions.  Norm ParcelHeather V Ellis Koffler 12/08/2015, 10:29 PM

## 2015-12-08 NOTE — ED Notes (Signed)
Pt has been changed into scrubs.  Pt and belongings wanded by security.  

## 2015-12-08 NOTE — ED Notes (Signed)
Pt and husband updated that we are [facility].

## 2015-12-08 NOTE — ED Provider Notes (Signed)
CSN: [id number]     Arrival date & time 12/08/15  1001 History   First MD Initiated Contact with Patient 12/08/15 1100     Chief Complaint  Patient presents with  . Medical Clearance     (Consider location/radiation/quality/duration/timing/severity/associated sxs/prior Treatment) Patient is a 45 y.o. female presenting with mental health disorder. The history is provided by the patient and the spouse.  Mental Health Problem Presenting symptoms: bizarre behavior, depression and disorganized thought process   Patient accompanied by:  Family member Degree of incapacity (severity):  Moderate Onset quality:  Gradual Duration:  4 days Timing:  Constant Progression:  Worsening Chronicity:  Recurrent Treatment compliance:  All of the time Relieved by:  Nothing Worsened by:  Nothing tried Ineffective treatments:  None tried Associated symptoms: anxiety   Associated symptoms: no chest pain and no headaches    45 yo F With a chief complaint of worsening of her schizophrenia. Has been acting bizarrely at home. Family has been concerned about her feels like this is similar to the last time should be admitted. She refuses to talk to me on exam. Has been taking her medications as prescribed. This been going on for about 4 days worsening.  Past Medical History  Diagnosis Date  . Schizophrenia (HCC)   . Depression    History reviewed. No pertinent past surgical history. History reviewed. No pertinent family history. Social History  Substance Use Topics  . Smoking status: Never Smoker   . Smokeless tobacco: Never Used  . Alcohol Use: No   OB History    No data available     Review of Systems  Constitutional: Negative for fever and chills.  HENT: Negative for congestion and rhinorrhea.   Eyes: Negative for redness and visual disturbance.  Respiratory: Negative for shortness of breath and wheezing.   Cardiovascular: Negative for chest pain and palpitations.  Gastrointestinal: Negative  for nausea and vomiting.  Genitourinary: Negative for dysuria and urgency.  Musculoskeletal: Negative for myalgias and arthralgias.  Skin: Negative for pallor and wound.  Neurological: Negative for dizziness and headaches.  Psychiatric/Behavioral: Positive for dysphoric mood and decreased concentration. The patient is nervous/anxious.       Allergies  Review of patient's allergies indicates no known allergies.  Home Medications   Prior to Admission medications   Medication Sig Start Date End Date Taking? Authorizing Provider  acetaminophen (TYLENOL) 500 MG tablet Take 500 mg by mouth every 6 (six) hours as needed for moderate pain or headache.   Yes Historical Provider, MD  meloxicam (MOBIC) 15 MG tablet Take 1 tablet (15 mg total) by mouth daily. 06/18/15   Pincus Sanes, MD   BP 143/80 mmHg  Pulse 112  Temp(Src) 98.2 F (36.8 C) (Oral)  Resp 16  SpO2 97% Physical Exam  Constitutional: She is oriented to person, place, and time. She appears well-developed and well-nourished. No distress.  HENT:  Head: Normocephalic and atraumatic.  Eyes: EOM are normal. Pupils are equal, round, and reactive to light.  Neck: Normal range of motion. Neck supple.  Cardiovascular: Normal rate and regular rhythm.  Exam reveals no gallop and no friction rub.   No murmur heard. Pulmonary/Chest: Effort normal. She has no wheezes. She has no rales.  Abdominal: Soft. She exhibits no distension. There is no tenderness.  Musculoskeletal: She exhibits no edema or tenderness.  Neurological: She is alert and oriented to person, place, and time.  Skin: Skin is warm and dry. She is not diaphoretic.  Psychiatric:  She has a normal mood and affect. Her behavior is normal.  Nursing note and vitals reviewed.   ED Course  Procedures (including critical care time) Labs Review Labs Reviewed  COMPREHENSIVE METABOLIC PANEL - Abnormal; Notable for the following:    Potassium 3.4 (*)    Glucose, Bld 107 (*)     All other components within normal limits  ACETAMINOPHEN LEVEL - Abnormal; Notable for the following:    Acetaminophen (Tylenol), Serum <10 (*)    All other components within normal limits  ETHANOL  SALICYLATE LEVEL  CBC  I-STAT BETA HCG BLOOD, ED ([facility], [facility], [facility] ONLY)    Imaging Review No results found. I have personally reviewed and evaluated these images and lab results as part of my medical decision-making.   EKG Interpretation None      MDM   Final diagnoses:  Depression, major, recurrent, severe with psychosis (HCC)    45 yo F with hx of schizo, here with worsening of her symptoms.  TTS eval.   Will admit.   The patients results and plan were reviewed and discussed.   Any x-rays performed were independently reviewed by myself.   Differential diagnosis were considered with the presenting HPI.  Medications - No data to display  Filed Vitals:   12/08/15 1020 12/08/15 1740 12/08/15 2051  BP: 140/86 114/61 143/80  Pulse: 106 103 112  Temp: 98.2 F (36.8 C) 98.3 F (36.8 C) 98.2 F (36.8 C)  TempSrc: Oral Oral Oral  Resp: 18 16 16   SpO2: 96% 99% 97%    Final diagnoses:  Depression, major, recurrent, severe with psychosis (HCC)     Melene Planan Tuyen Uncapher, DO 12/09/15 [id number]

## 2015-12-08 NOTE — ED Notes (Signed)
[facility].  Pt's husband reports that she has not been eating or drinking and has been confused.  Denies SI/HI/AV.  Hx of schizophrenia and depression.  Pt decided to stop taking medications in Nov 2016.  Pt alert, but will not respond to questions.

## 2015-12-08 NOTE — ED Notes (Signed)
Spoke w/ [facility] AC, Pt will not have a room until later.  Directed to move Pt to SAPPU.

## 2015-12-08 NOTE — [facility] Counselor (Signed)
[facility] under Dr Elna BreslowEappen. Pt to come after 8:00 PM.  Voluntary support paperwork completed for pt with assistance of pt's husband due to language barrier. Paperwork left for RN, Aundra MilletMegan.  Dr Effie ShyWentz was made aware of pt's acceptance.   Cyndie Mull- Ave [address], [facility]   Therapeutic Triage   Ext  [phone]

## 2015-12-08 NOTE — ED Notes (Signed)
Attempted to orient pt. To unit.  Pt is very withdrawn but softly states her name.  It is difficult to know if pt speaks [place].  15 minute checks and video monitoring continue.

## 2015-12-08 NOTE — ED Notes (Signed)
Bed: WBH35 Expected date:  Expected time:  Means of arrival:  Comments: Triage 4 

## 2015-12-08 NOTE — Progress Notes (Signed)
Pt left the unit to be evaluated at the ED for elevated pulse.  [facility] with staff by her side.

## 2015-12-08 NOTE — Progress Notes (Signed)
Alyssa Avila is a 45 year old female being admitted voluntarily to room 507-2 from [facility].  She came in with her husband.  She was just staring ahead with flat affect.  Husband reports that she has become confused, not speaking, not eating, not sleeping, decrease in ADL's and she is worried about everything.   Per history, she was taken off her Lexapro in December by Dr. Donell BeersPlovsky.  She has history of Schizophrenia and depression.  No history of drug or alcohol abuse.  Pt pulse was elevated on admission resting ranging from 128-142.  Upon standing her pulse elevated to 162.  Notified practitioner.  Plan to send her to ED for fluids.  She was unable to answer questions for Clinical research associatewriter.  Skin search completed and no skin issues noted.  She came in with no belongings.  Q 15 minute checks initiated for safety.  We will continue to monitor the progress towards her goals.

## 2015-12-09 ENCOUNTER — Inpatient Hospital Stay
Admission: EM | Admit: 2015-12-09 | Discharge: 2015-12-16 | DRG: 885 | Disposition: A | Discharge status: Home / Self Care | Payer: BLUE CROSS/BLUE SHIELD | Source: Intra-hospital | Attending: Psychiatry | Admitting: Psychiatry

## 2015-12-09 ENCOUNTER — Encounter (HOSPITAL_COMMUNITY): Payer: Self-pay | Admitting: Psychiatry

## 2015-12-09 DIAGNOSIS — Z8659 Personal history of other mental and behavioral disorders: Secondary | ICD-10-CM | POA: Insufficient documentation

## 2015-12-09 DIAGNOSIS — F259 Schizoaffective disorder, unspecified: Secondary | ICD-10-CM

## 2015-12-09 DIAGNOSIS — F061 Catatonic disorder due to known physiological condition: Secondary | ICD-10-CM | POA: Diagnosis not present

## 2015-12-09 DIAGNOSIS — F339 Major depressive disorder, recurrent, unspecified: Secondary | ICD-10-CM

## 2015-12-09 DIAGNOSIS — F333 Major depressive disorder, recurrent, severe with psychotic symptoms: Secondary | ICD-10-CM | POA: Diagnosis present

## 2015-12-09 DIAGNOSIS — Z79899 Other long term (current) drug therapy: Secondary | ICD-10-CM

## 2015-12-09 DIAGNOSIS — F332 Major depressive disorder, recurrent severe without psychotic features: Secondary | ICD-10-CM | POA: Diagnosis present

## 2015-12-09 LAB — I-STAT TROPONIN, ED: TROPONIN I, POC: 0 ng/mL (ref 0.00–0.08)

## 2015-12-09 MED ORDER — ZIPRASIDONE MESYLATE 20 MG IM SOLR: 20.0000 mg | INTRAMUSCULAR | Status: DC | PRN

## 2015-12-09 MED ORDER — MAGNESIUM HYDROXIDE 400 MG/5ML PO SUSP
30.0000 mL | Freq: Every day | ORAL | Status: DC | PRN
  Administered 2015-12-13: 30 mL via ORAL
  Filled 2015-12-09: qty 30

## 2015-12-09 MED ORDER — HALOPERIDOL 5 MG PO TABS: 5.0000 mg | ORAL_TABLET | Freq: Three times a day (TID) | ORAL | Status: DC | PRN

## 2015-12-09 MED ORDER — LORAZEPAM 1 MG PO TABS
1.0000 mg | ORAL_TABLET | Freq: Once | ORAL | Status: AC
  Administered 2015-12-09: 1 mg via ORAL
  Filled 2015-12-09: qty 1

## 2015-12-09 MED ORDER — ACETAMINOPHEN 500 MG PO TABS: 500.0000 mg | ORAL_TABLET | Freq: Four times a day (QID) | ORAL | Status: DC | PRN

## 2015-12-09 MED ORDER — MELOXICAM 15 MG PO TABS: 15.0000 mg | ORAL_TABLET | Freq: Every day | ORAL | Status: DC

## 2015-12-09 MED ORDER — ACETAMINOPHEN 500 MG PO TABS
500.0000 mg | ORAL_TABLET | Freq: Four times a day (QID) | ORAL | Status: DC | PRN
Start: 2015-12-09 — End: 2015-12-09

## 2015-12-09 MED ORDER — RISPERIDONE 2 MG PO TBDP: 2.0000 mg | ORAL_TABLET | Freq: Three times a day (TID) | ORAL | Status: DC | PRN

## 2015-12-09 MED ORDER — ALUM & MAG HYDROXIDE-SIMETH 200-200-20 MG/5ML PO SUSP
30.0000 mL | ORAL | Status: DC | PRN
Start: 2015-12-09 — End: 2015-12-09

## 2015-12-09 MED ORDER — BENZTROPINE MESYLATE 0.5 MG PO TABS: 0.5000 mg | ORAL_TABLET | Freq: Three times a day (TID) | ORAL | Status: DC | PRN

## 2015-12-09 MED ORDER — ACETAMINOPHEN 325 MG PO TABS
650.0000 mg | ORAL_TABLET | Freq: Four times a day (QID) | ORAL | Status: DC | PRN
  Administered 2015-12-15: 650 mg via ORAL
  Filled 2015-12-09: qty 2

## 2015-12-09 MED ORDER — ENSURE ENLIVE PO LIQD
237.0000 mL | ORAL | Status: DC | PRN
  Administered 2015-12-09: 237 mL via ORAL
  Filled 2015-12-09: qty 237

## 2015-12-09 MED ORDER — TRAZODONE HCL 50 MG PO TABS: 50.0000 mg | ORAL_TABLET | Freq: Every evening | ORAL | Status: DC | PRN

## 2015-12-09 MED ORDER — LORAZEPAM 1 MG PO TABS: 1.0000 mg | ORAL_TABLET | Freq: Three times a day (TID) | ORAL | Status: DC

## 2015-12-09 MED ORDER — ESCITALOPRAM OXALATE 10 MG PO TABS
10.0000 mg | ORAL_TABLET | Freq: Every day | ORAL | Status: DC
  Administered 2015-12-09: 10 mg via ORAL
  Filled 2015-12-09 (×4): qty 1

## 2015-12-09 MED ORDER — SODIUM CHLORIDE 0.9 % IV BOLUS (SEPSIS)
1000.0000 mL | Freq: Once | INTRAVENOUS | Status: AC
  Administered 2015-12-09: 1000 mL via INTRAVENOUS

## 2015-12-09 MED ORDER — ALUM & MAG HYDROXIDE-SIMETH 200-200-20 MG/5ML PO SUSP: 30.0000 mL | ORAL | Status: DC | PRN

## 2015-12-09 MED ORDER — ENSURE ENLIVE PO LIQD: 237.0000 mL | ORAL | Status: DC | PRN

## 2015-12-09 MED ORDER — OLANZAPINE 2.5 MG PO TABS: 2.5000 mg | ORAL_TABLET | ORAL | Status: DC

## 2015-12-09 MED ORDER — MELOXICAM 7.5 MG PO TABS
15.0000 mg | ORAL_TABLET | Freq: Every day | ORAL | Status: DC
  Administered 2015-12-09: 15 mg via ORAL
  Filled 2015-12-09: qty 1
  Filled 2015-12-09 (×2): qty 2
  Filled 2015-12-09: qty 1

## 2015-12-09 MED ORDER — MAGNESIUM HYDROXIDE 400 MG/5ML PO SUSP: 30.0000 mL | Freq: Every day | ORAL | Status: DC | PRN

## 2015-12-09 MED ORDER — LORAZEPAM 1 MG PO TABS
1.0000 mg | ORAL_TABLET | Freq: Three times a day (TID) | ORAL | Status: DC
  Administered 2015-12-09: 1 mg via ORAL
  Filled 2015-12-09: qty 1

## 2015-12-09 MED ORDER — OLANZAPINE 2.5 MG PO TABS
2.5000 mg | ORAL_TABLET | ORAL | Status: DC
  Administered 2015-12-09: 2.5 mg via ORAL
  Filled 2015-12-09 (×7): qty 1

## 2015-12-09 MED ORDER — ACETAMINOPHEN 325 MG PO TABS: 650.0000 mg | ORAL_TABLET | Freq: Four times a day (QID) | ORAL | Status: DC | PRN

## 2015-12-09 MED ORDER — LORAZEPAM 1 MG PO TABS: 1.0000 mg | ORAL_TABLET | ORAL | Status: DC | PRN

## 2015-12-09 MED ORDER — TRAZODONE HCL 50 MG PO TABS
50.0000 mg | ORAL_TABLET | Freq: Every evening | ORAL | Status: DC | PRN
  Filled 2015-12-09 (×4): qty 1

## 2015-12-09 MED ORDER — ESCITALOPRAM OXALATE 10 MG PO TABS: 10.0000 mg | ORAL_TABLET | Freq: Every day | ORAL | Status: DC

## 2015-12-09 MED ORDER — ENSURE ENLIVE PO LIQD: 237.0000 mL | Freq: Three times a day (TID) | ORAL | Status: DC

## 2015-12-09 NOTE — [facility] Group Notes (Signed)
[facility] LCSW Group Therapy  12/09/2015 , 1:25 PM   Type of Therapy:  Group Therapy  Participation Level:  Active  Participation Quality:  Attentive  Affect:  Appropriate  Cognitive:  Alert  Insight:  Improving  Engagement in Therapy:  Engaged  Modes of Intervention:  Discussion, Exploration and Socialization  Summary of Progress/Problems: Today's group focused on the term Diagnosis.  Participants were asked to define the term, and then pronounce whether it is a negative, positive or neutral term.  Invited.  Chose to not participate  Ida Rogueorth, Yaira Bernardi B 12/09/2015 , 1:25 PM

## 2015-12-09 NOTE — Plan of Care (Signed)
Problem: Alteration in mood Goal: LTG-Pt's behavior demonstrates decreased signs of depression (Patient's behavior demonstrates decreased signs of depression to the point the patient is safe to return home and continue treatment in an outpatient setting) Outcome: Not Progressing Reports and endorses Depression

## 2015-12-09 NOTE — ED Notes (Signed)
Gave report to Animatorenny Charge [facility].

## 2015-12-09 NOTE — H&P (Signed)
Psychiatric Admission Assessment Adult  Patient Identification: Alyssa Avila MRN:  [id number] Date of Evaluation:  12/09/2015 Chief Complaint: "Patient is mute.'  Principal Diagnosis: MDD (major depressive disorder), recurrent, with catatonic features (HCC) Diagnosis:   Patient Active Problem List   Diagnosis Date Noted  . 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 Hx of schizophrenia [Z86.59] 12/09/2015  . Right shoulder pain [M25.511] 06/18/2015  . 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   Writer attempted to evaluate the patient who appears mute - she did try to answer some questions - mostly in "whispers" - stated " good " when Clinical research associate asked Alyssa how she felt ? However , other than that did not respond to questions asked . Per staff - pt has not been taking food or fluids , has been taking sips of fluids with medications with a lot of encouragement.  Writer attempted to obtain collateral information from family - no response at this time.  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 Pt was doing well on Lexapro as well as Zyprexa , but then wanted to be tapered off Alyssa medications . This was done few months ago , which could have resulted in Alyssa decompensation.  Majority of information below are from Alyssa EHR : Patient  lives with Alyssa Avila and Alyssa 3 children ages approximately - 20 and 80 and 84. Alyssa Avila is  from [place].Patient has been hospitalized at [facility] behavioral [facility] 2 or 3 times. In 2009 she was discharged with a diagnosis of schizophrenia and during Alyssa last hospitalization and was on Clozaril. Patient was supposed to follow up with ACT team.  Pt was [facility] at [facility] in [facility] 2016. Pt started following up with Dr.Pvlosky since June 2016 and Alyssa diagnosis was changed to MDD .       Associated Signs/Symptoms: Depression Symptoms:  depressed mood, (Hypo) Manic Symptoms:  None observed Anxiety Symptoms:  none observed Psychotic Symptoms:  unable to assess PTSD Symptoms: unable to assess Total Time spent with patient: 1 hour  Past Psychiatric History: Per EHR :patient has been hospitalized in [place] twice before in the early 2000. Initially Alyssa diagnosis was major depressive disorder with psychotic features postpartum onset later on the diagnosis was changed to schizophrenia. She initially was treated with olanzapine and Lexapro. Looks like after that should receive Abilify from in Alyssa last discharge summary the patient was discharged on Clozaril and was a scheduled to follow-up with ACT team, but did not . Pt was admitted at [facility] in 2016 . She follows up with [facility]   Is the patient at risk to self? Yes.    Has the patient been a risk to self in the past 6 months? Yes.    Has the patient been a risk to self within the distant past? Yes.    Is the patient  a risk to others? No.  Has the patient been a risk to others in the past 6 months? No.  Has the patient been a risk to others within the distant past? No.   Prior Inpatient Therapy: Prior Inpatient Therapy: Yes Prior Therapy Dates: 2009; 2010; 2016 Prior Therapy Facilty/Provider(s): [facility]; [facility] Reason for Treatment: MDD Prior Outpatient Therapy: Prior Outpatient Therapy: Yes Prior Therapy Dates: current Does patient have an ACCT team?: No Does patient have Intensive In-House Services?  :  No Does patient have [facility] services? : No Does patient have P4CC services?: No  Alcohol Screening: 1. How often do you have a drink containing alcohol?: Never 9. Have you or someone else been injured as a result of your drinking?: No 10. Has a relative or friend or a doctor or another health worker been concerned about your drinking or suggested you cut down?: No Alcohol Use Disorder Identification Test Final Score (AUDIT): 0 Brief Intervention: AUDIT score less than 7 or less-screening does not suggest unhealthy drinking-brief intervention not indicated Substance Abuse History in the last 12 months:  Unable to assess Consequences of Substance Abuse: Negative Previous Psychotropic Medications: zyprexa, lexapro, abilify, clozaril Psychological Evaluations: No  Past Medical History:  Past Medical History  Diagnosis Date  . Schizophrenia (HCC)   . Depression    History reviewed. No pertinent past surgical history. Family History:Pt is not able to respond Family Psychiatric  History: unable to get hx Tobacco Screening:unable to assess Social History: Pt is married , lives with Avila in [place] , has three children. History  Alcohol Use No     History  Drug Use No    Additional Social History: Marital status: Married    Pain Medications: none noted Prescriptions: pt was taking Lexapro, but d/c in December 2016 Over the Counter: none noted History of alcohol / drug use?: No history of alcohol / drug abuse                    Allergies:  No Known Allergies Lab Results:  Results for orders placed or performed during the hospital encounter of 12/08/15 (from the past 48 hour(s))  I-stat troponin, ED     Status: None   Collection Time: 12/09/15  2:34 AM  Result Value Ref Range   Troponin i, poc 0.00 0.00 - 0.08 ng/mL   Comment 3            Comment: Due to the release kinetics of cTnI, a negative result within the first hours of the onset of symptoms does not rule  out myocardial infarction with certainty. If myocardial infarction is still suspected, repeat the test at appropriate intervals.     Blood Alcohol level:  Lab Results  Component Value Date   [facility] <5 12/08/2015   ETH <5 12/22/2014    Metabolic Disorder Labs:  Lab Results  Component Value Date   HGBA1C 5.2 12/25/2014   No results found for: PROLACTIN Lab Results  Component Value Date   CHOL 136 12/25/2014   TRIG 65 12/25/2014   HDL 45 12/25/2014   CHOLHDL 3.0 12/25/2014   VLDL 13 12/25/2014   LDLCALC 78 12/25/2014    Current Medications: Current Facility-Administered Medications  Medication Dose Route Frequency Provider Last Rate Last Dose  . acetaminophen (TYLENOL) tablet 500 mg  500 mg Oral Q6H PRN Kerry Hough, PA-C      . haloperidol (HALDOL) tablet 5 mg  5 mg Oral Q8H PRN Jomarie Longs, MD  And  . benztropine (COGENTIN) tablet 0.5 mg  0.5 mg Oral Q8H PRN Yonas Bunda, MD      . escitalopram (LEXAPRO) tablet 10 mg  10 mg Oral Daily Zi Newbury, MD      . feeding supplement (ENSURE ENLIVE) (ENSURE ENLIVE) liquid 237 mL  237 mL Oral PRN Kerry Hough, PA-C      . LORazepam (ATIVAN) tablet 1 mg  1 mg Oral TID Jomarie Longs, MD   1 mg at 12/09/15 1140  . meloxicam (MOBIC) tablet 15 mg  15 mg Oral Daily Kerry Hough, PA-C   15 mg at 12/09/15 1610  . OLANZapine (ZYPREXA) tablet 2.5 mg  2.5 mg Oral [id number] Meital Riehl, MD      . traZODone (DESYREL) tablet 50 mg  50 mg Oral QHS,MR X 1 Spencer E Simon, PA-C       PTA Medications: Prescriptions prior to admission  Medication Sig Dispense Refill Last Dose  . acetaminophen (TYLENOL) 500 MG tablet Take 500 mg by mouth every 6 (six) hours as needed for moderate pain or headache.   2 weeks  . meloxicam (MOBIC) 15 MG tablet Take 1 tablet (15 mg total) by mouth daily. 30 tablet 2     Musculoskeletal: Strength & Muscle Tone: within normal limits Gait & Station: normal Patient leans: N/A  Psychiatric  Specialty Exam: Physical Exam  Nursing note and vitals reviewed. Constitutional:  I concur with PE done in ED.    Review of Systems  Unable to perform ROS: mental acuity    Blood pressure 148/62, pulse 108, temperature 98.6 F (37 C), temperature source Oral, resp. rate 20, SpO2 98 %.There is no weight on file to calculate BMI.  General Appearance: casual, lays in bed   Eye Contact::  Fair  Speech:  was able to say one or two words - usually in whispers - otherwise mute  Volume:  Decreased  Mood:  is mute  Affect:  Depressed  Thought Process:  pt is mute  Orientation:  Other:  is mute  Thought Content:  is mute  Suicidal Thoughts:  did not express any  Homicidal Thoughts:  did not express any  Memory:  is mute  Judgement:  Other:  mute  Insight:  is mute  Psychomotor Activity:  Decreased  Concentration:  Poor  Recall:  mute  Fund of Knowledge:is mute  Language: is mute   Akathisia:  No  Handed:  Right  AIMS (if indicated):     Assets:  Others:  access to health care  ADL's:  Impaired  Cognition: grossly impaired   Sleep:        Treatment Plan Summary:Archana is a 76 y old Female , who is married , lives with Alyssa Avila in [place] , has a hx of depression, presents to [facility] with worsening symptoms of depression, being mute and not taking any food or fluids since the past atleast three days.Pt will need inpatient stabilization. Daily contact with patient to assess and evaluate symptoms and progress in treatment and Medication management   Patient will benefit from inpatient treatment and stabilization.  Estimated length of stay is 5-7 days.  Reviewed past medical records,treatment plan.  Will restart Lexapro 10 mg po daily for affective sx, pt according to Dr.Pvlosky was doing well on that. Will start a trial of Zyprexa 2.5 mg po tid for augmenting the effect of Lexapro. Will add Ativan 1 mg po tid for catatonic features . Will make available PRN  medications as per  agitation protocol. Will continue to monitor vitals ,medication compliance and treatment side effects while patient is here.  Will monitor for medical issues as well as call consult as needed.  Reviewed labs cbc - wnl, cmp - K+ is 3.4, pregnancy test negative , BAL<5 , EKG shows Tachycardia - pt medically cleared per ED , Tachycardia likely due to Alyssa dehydration ,will order TSH, lipid panel, hba1c, PL as well as UDS. Collateral information was obtained from Dr.Pvlosky as well as EHR. Attempted to contact family - not available at this time. Discussed with CSW to refer pt for ECT fir catatonia - she has a hx of catatonic sx in the past.  CSW will start working on disposition.  Patient to participate in therapeutic milieu .       Observation Level/Precautions:  15 minute checks    Psychotherapy:  Individual and group therapy     Consultations:  Social worker  Discharge Concerns:stability/safety         I certify that inpatient services furnished can reasonably be expected to improve the patient's condition.    Jomarie LongsEappen,Erek Kowal, MD 4/18/201712:13 PM

## 2015-12-09 NOTE — ED Notes (Signed)
[place] transportation called for transport

## 2015-12-09 NOTE — Progress Notes (Signed)
Admission Note:  D: Pt appeared depressed  With  a flat affect. Has not been eating well   Here for ECT .   Patient has  Pt  cooperative with assessment.  Admission done by Language Line      A: Pt admitted to unit per protocol, skin assessment and search done and no contraband found.  Pt  educated on therapeutic milieu rules. Pt was introduced to milieu by nursing Phone Interputer  For language  Alyssa Avila( Gabe Amharic )    R: Pt was receptive to education about the milieu .  15 min safety checks started. Clinical research associatewriter offered support

## 2015-12-09 NOTE — ED Notes (Signed)
Tried to call report to Adult unit for second time and no answer.

## 2015-12-09 NOTE — Progress Notes (Signed)
Patient has been accepted to [facility].  Patient assigned to room 323 Accepting physician is Dr. Toni Amendlapacs.  Call report to [phone].  Representative was Rod.   12/09/2015 Cheryl FlashNicole Loretta Doutt, MS, NCC, LPCA Therapeutic Triage Specialist

## 2015-12-09 NOTE — Progress Notes (Signed)
Patient ID: Rhona LeavensSerkalem Marsiglia, female   DOB: Nov 24, 1970, 45 y.o.   MRN: [id number] Report called to Victorino DikeJennifer [facility] at [facility], some concern re echo ordered for tomorrow at 10am. Rayfield Citizenaroline charge nurse today spoke with Dr Elna BreslowEappen and she is not concerned and will cancel Echo. Dr Elna BreslowEappen believes her elevated heart rate is a result of her dehydration. [facility] hesitant and speaking with Ship brokertheir director.  [place] signed by patient, ready for transfer from our perspective, she is medically clear.

## 2015-12-09 NOTE — Progress Notes (Signed)
Nutrition Brief Note  Patient identified on the Malnutrition Screening Tool (MST) Report  Weight for this admission is needed. Last weight recorded from November 2016. Per chart review, pt was not eating or drinking a few days PTA. If patient continues poor PO intake, provide Ensure supplements. RD to place order.  Wt Readings from Last 15 Encounters:  07/23/15 144 lb 9.6 oz (65.59 kg)  06/18/15 143 lb (64.864 kg)  04/23/15 140 lb (63.504 kg)  04/10/15 140 lb (63.504 kg)  03/19/15 138 lb (62.596 kg)  12/31/14 138 lb (62.596 kg)  12/22/14 139 lb 1.6 oz (63.095 kg)  12/09/14 144 lb (65.318 kg)    There is no weight on file to calculate BMI.  Labs and medications reviewed.   Diet Order: Diet regular Room service appropriate?: Yes; Fluid consistency:: Thin Pt is also offered choice of unit snacks mid-morning and mid-afternoon.   No nutrition interventions warranted at this time. If nutrition issues arise, please consult RD.   Alyssa FrancoLindsey Paola Aleshire, MS, RD, LDN Pager: [phone] After Hours Pager: [phone]

## 2015-12-09 NOTE — Progress Notes (Signed)
Patient ID: Alyssa Avila, female   DOB: 12-06-1970, 45 y.o.   MRN: [id number] D-Initial contact this am was to give her her scheduled medications which were Mobic. Unclear where her pain is, since she is non verbal. Put medications in her hand and water to her lips. She took meds herself. Put a glass of lemonade on her night stand along with a magazine. When checked no her a short time later was lying down. She had not touched the lemonade or any more of the H2O.  A-Monitor for safety. Encourage fluids. Mediations as ordered. She has an order for a cardio gram, [facility]. Augusto GambleJerri, who I spoke with in the office said she had to be authorized for the cardio gram. R-Non verbal. Slow to move. Not taking in fluids or food. Stays to self in her room.

## 2015-12-09 NOTE — BHH Counselor (Signed)
Pt's primary language is Amharic, but it was apparent during initial triage assessment that she was experiencing some level of psychosis, so it is unclear if pt is able to speak or understand any English at this point.

## 2015-12-09 NOTE — Clinical Social Work Note (Signed)
[facility]lyssa Avila Nicole at  [facility] to [facility] pt for review for ECT.  [phone]

## 2015-12-09 NOTE — Progress Notes (Signed)
Patient ID: Alyssa Avila, female   DOB: May 18, 1971, 45 y.o.   MRN: [id number] Transferred to [facility] for E[facility]. No property to return to her. Sent out via [facility] with a tech to ride along. Sent with the tech her voluntary paperwork and EMTALA. She was made aware of transfer and social worker Rod spoke to her husband to inform of the transfer. Dr Elna BreslowEappen wrote transfer order, and reassured staff here and at [facility] she no longer needed an echo cardiagram, believed her tachycardia related to her dehydration. Her vitals on transfer were better since fluids had been encouraged this shift.

## 2015-12-09 NOTE — ED Provider Notes (Signed)
CSN: [id number]     Arrival date & time 12/08/15  2215 History   First MD Initiated Contact with Patient 12/09/15 0203     Chief Complaint  Patient presents with  . Tachycardia     (Consider location/radiation/quality/duration/timing/severity/associated sxs/prior Treatment) HPI Comments: Level V caveat applies secondary to psychiatric disorder.  45 year old female was recently accepted to behavioral health for inpatient psychiatric management for major depression with psychosis. She was sent back to the emergency department for evaluation of tachycardia. Patient with mild tachycardia on my exam. She appears anxious as well as depressed with a flat affect. She denies any chest pain or shortness of breath. She is very reserved and is resistant to answering any further questions. Per previous notes, patient was not eating or drinking for the last few days.  The history is provided by the patient and medical records. No language interpreter was used.    Past Medical History  Diagnosis Date  . Schizophrenia (HCC)   . Depression    History reviewed. No pertinent past surgical history. History reviewed. No pertinent family history. Social History  Substance Use Topics  . Smoking status: Never Smoker   . Smokeless tobacco: Never Used  . Alcohol Use: No   OB History    No data available      Review of Systems  Unable to perform ROS: Psychiatric disorder  Constitutional: Negative for fever.  Cardiovascular: Negative for chest pain.  Neurological: Negative for syncope.    Allergies  Review of patient's allergies indicates no known allergies.  Home Medications   Prior to Admission medications   Medication Sig Start Date End Date Taking? Authorizing Provider  acetaminophen (TYLENOL) 500 MG tablet Take 500 mg by mouth every 6 (six) hours as needed for moderate pain or headache.    Historical Provider, MD  meloxicam (MOBIC) 15 MG tablet Take 1 tablet (15 mg total) by mouth daily.  06/18/15   Pincus SanesStacy J Burns, MD   BP 133/86 mmHg  Pulse 98  Temp(Src) 98.5 F (36.9 C) (Oral)  Resp 18  SpO2 95%   Physical Exam  Constitutional: She is oriented to person, place, and time. She appears well-developed and well-nourished. No distress.  Nontoxic/nonseptic appearing  HENT:  Head: Normocephalic and atraumatic.  Eyes: Conjunctivae and EOM are normal. No scleral icterus.  Neck: Normal range of motion.  Cardiovascular: Regular rhythm and intact distal pulses.   Mild tachycardia.  Pulmonary/Chest: Effort normal. No respiratory distress. She has no wheezes.  Respirations even and unlabored  Musculoskeletal: Normal range of motion.  Neurological: She is alert and oriented to person, place, and time. She exhibits normal muscle tone. Coordination normal.  Skin: Skin is warm and dry. No rash noted. She is not diaphoretic. No erythema. No pallor.  Psychiatric: Her mood appears anxious. She is withdrawn. She exhibits a depressed mood.  Flat affect  Nursing note and vitals reviewed.   ED Course  Procedures (including critical care time) Labs Review Labs Reviewed  Rosezena SensorI-STAT TROPOININ, ED     Results for orders placed or performed during the hospital encounter of 12/08/15  Comprehensive metabolic panel  Result Value Ref Range   Sodium 142 135 - 145 mmol/L   Potassium 3.4 (L) 3.5 - 5.1 mmol/L   Chloride 110 101 - 111 mmol/L   CO2 23 22 - 32 mmol/L   Glucose, Bld 107 (H) 65 - 99 mg/dL   BUN 13 6 - 20 mg/dL   Creatinine, Ser 4.090.69 0.44 -  1.00 mg/dL   Calcium 9.2 8.9 - 16.1 mg/dL   Total Protein 8.1 6.5 - 8.1 g/dL   Albumin 4.9 3.5 - 5.0 g/dL   AST 18 15 - 41 U/L   ALT 15 14 - 54 U/L   Alkaline Phosphatase 38 38 - 126 U/L   Total Bilirubin 0.8 0.3 - 1.2 mg/dL   GFR calc non Af Amer >60 >60 mL/min   GFR calc Af Amer >60 >60 mL/min   Anion gap 9 5 - 15  Ethanol (ETOH)  Result Value Ref Range   Alcohol, Ethyl (B) <5 <5 mg/dL  Salicylate level  Result Value Ref Range    Salicylate Lvl <4.0 2.8 - 30.0 mg/dL  Acetaminophen level  Result Value Ref Range   Acetaminophen (Tylenol), Serum <10 (L) 10 - 30 ug/mL  CBC  Result Value Ref Range   WBC 4.9 4.0 - 10.5 K/uL   RBC 4.84 3.87 - 5.11 MIL/uL   Hemoglobin 13.9 12.0 - 15.0 g/dL   HCT 09.6 04.5 - 40.9 %   MCV 82.9 78.0 - 100.0 fL   MCH 28.7 26.0 - 34.0 pg   MCHC 34.7 30.0 - 36.0 g/dL   RDW 81.1 91.4 - 78.2 %   Platelets 296 150 - 400 K/uL  I-Stat beta hCG blood, ED ([facility], [facility], [facility] only)  Result Value Ref Range   I-stat hCG, quantitative <5.0 <5 mIU/mL   Comment 3           Imaging Review No results found.   I have personally reviewed and evaluated these images and lab results as part of my medical decision-making.   EKG Interpretation   Date/Time:  Tuesday December 09 2015 02:18:42 EDT Ventricular Rate:  116 PR Interval:  116 QRS Duration: 79 QT Interval:  349 QTC Calculation: 485 R Axis:   67 Text Interpretation:  Sinus tachycardia RSR' in V1 or V2, right VCD or RVH  When compared with ECG of 12/28/2014, HEART RATE has increased Confirmed by  [facility]  MD, DAVID ([id number]) on 12/09/2015 2:58:13 AM      Medications  acetaminophen (TYLENOL) tablet 500 mg (not administered)  meloxicam (MOBIC) tablet 15 mg (not administered)  traZODone (DESYREL) tablet 50 mg (not administered)  risperiDONE (RISPERDAL M-TABS) disintegrating tablet 2 mg (not administered)    And  LORazepam (ATIVAN) tablet 1 mg (not administered)    And  ziprasidone (GEODON) injection 20 mg (not administered)  sodium chloride 0.9 % bolus 1,000 mL (0 mLs Intravenous Stopped 12/09/15 0426)  LORazepam (ATIVAN) tablet 1 mg (1 mg Oral Given 12/09/15 0323)    MDM   Final diagnoses:  Tachycardia    45 year old female presents to the emergency department for evaluation of tachycardia. I suspect that this may be secondary to dehydration and malnourishment, coupled with psychosis, as patient has not had much to eat or drink over the past 3 days.  Patient is very reserved and resistant to answering any questions. She does, however, state that she does not have any chest pain or shortness of breath. Her EKG is nonischemic and her troponin is negative. Prior laboratory workup from less than 12 hours ago was reviewed and is unremarkable. Mild tachycardia has been stable. Patient has had no hypoxia. I do not believe further emergent workup is indicated currently. Patient will be deemed medically cleared and stable for transport back to behavioral health.   Filed Vitals:   12/08/15 2133 12/08/15 2305 12/09/15 0109 12/09/15 0218  BP:  139/91   133/86  Pulse: 162 107 108 98  Temp:    98.5 F (36.9 C)  TempSrc:    Oral  Resp:    18  SpO2:    95%     Antony Madura, PA-C 12/09/15 9604  Dione Booze, MD 12/09/15 [phone]

## 2015-12-09 NOTE — [facility] Suicide Risk Assessment (Signed)
[facility]   Nursing information obtained from:  Patient Demographic factors:  NA Current Mental Status:  NA (pt unable to answer question) Loss Factors:  NA Historical Factors:  NA (Unable to answer question) Risk Reduction Factors:  Living with another person, especially a relative, Positive social support  Total Time spent with patient: 30 minutes Principal Problem: MDD (major depressive disorder), recurrent, with catatonic features (HCC) Diagnosis:   Patient Active Problem List   Diagnosis Date Noted  . 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 Hx of schizophrenia [Z86.59] 12/09/2015  . Right shoulder pain [M25.511] 06/18/2015  . 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   Continued Clinical Symptoms:  Alcohol Use Disorder Identification Test Final Score (AUDIT): 0 The "Alcohol Use Disorders Identification Test", Guidelines for Use in Primary Care, Second Edition.  World Science writerHealth Organization [facility](WHO). Score between 0-7:  no or low risk or alcohol related problems. Score between 8-15:  moderate risk of alcohol related problems. Score between 16-19:  high risk of alcohol related problems. Score 20 or above:  warrants further diagnostic evaluation for alcohol dependence and treatment.   CLINICAL FACTORS:   Previous Psychiatric Diagnoses and Treatments   Musculoskeletal: Strength & Muscle Tone: within normal limits Gait & Station: seen in bed Patient leans: N/A  Psychiatric Specialty Exam: Review of Systems  Unable to perform ROS: mental acuity    Blood pressure 148/62, pulse 108, temperature 98.6 F (37 C), temperature source Oral, resp. rate 20, SpO2 98 %.There is no weight on file to calculate BMI.                      Please see H&P.                                   COGNITIVE FEATURES THAT CONTRIBUTE TO RISK:   Closed-mindedness, Polarized thinking and Thought constriction (tunnel vision)    SUICIDE RISK:   Moderate:  Frequent suicidal ideation with limited intensity, and duration, some specificity in terms of plans, no associated intent, good self-control, limited dysphoria/symptomatology, some risk factors present, and identifiable protective factors, including available and accessible social support.  PLAN OF CARE: Please see H&P.   I certify that inpatient services furnished can reasonably be expected to improve the patient's condition.   Mccade Sullenberger, MD 12/09/2015, 11:57 AM

## 2015-12-09 NOTE — ED Notes (Addendum)
Pt was put up for discharge and then PA-C Humes put in orders for a liter of fluids and PO ativan.

## 2015-12-09 NOTE — ED Notes (Signed)
Tried to call report. Sec answers and transfers me to an number no one picks up on.

## 2015-12-09 NOTE — Discharge Summary (Signed)
Physician Discharge Summary Note  Patient:  Alyssa Avila is an 45 y.o., female MRN:  [id number] DOB:  02-13-1971 Patient phone:  [phone] (home)  Patient address:   [address] [place] [id number],  Total Time spent with patient: Greater than 30 minutes  Date of Admission:  12/08/2015 Date of Discharge: 12-09-15  Reason for Admission: MDD (major depressive disorder), recurrent, with catatonic features (HCC)  Principal Problem: MDD (major depressive disorder), recurrent, with catatonic features [facility](HCC)  Discharge Diagnoses: Patient Active Problem List   Diagnosis Date Noted  . 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 Hx of schizophrenia [Z86.59] 12/09/2015  . Right shoulder pain [M25.511] 06/18/2015  . 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 Schizophrenia (HCC)   . Depression    History reviewed. No pertinent past surgical history.  Family History: History reviewed. No pertinent family history.  Family Psychiatric  History: See H&p  Social History:  History  Alcohol Use No     History  Drug Use No    Social History   Social History  . Marital Status: Married    Spouse Name: N/A  . Number of Children: N/A  . Years of Education: N/A   Social History Main Topics  . Smoking status: Never Smoker   . Smokeless tobacco: Never Used  . Alcohol Use: No  . Drug Use: No  . Sexual Activity: Not Asked   Other Topics Concern  . None   Social History Narrative   Hospital Course: Alyssa Avila is a [age] y old Female, who is married, lives [place], has a hx of depression, [facility] with worsening symptoms of depression, being mute and not taking any food or fluids since the past at least three days. Writer attempted to evaluate the patient who appears mute - she did try  to answer some questions - mostly in "whispers" - stated " good " when Clinical research associatewriter asked her how she felt ? However, other than that did not respond to questions asked . Per staff - pt has not been taking food or fluids, has been taking sips of fluids with medications with a lot of encouragement. Writer attempted to obtain collateral information from family - no response at this time. 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 Pt was doing well on Lexapro as well as Zyprexa , but then wanted to be tapered off her medications . This was done few months ago , which could have resulted in her decompensation.  And because of the severity of her mental illness & the catatonic symptoms being presented on admission, [name] is currently being transferred to the [facility] in [place], [place] for [place]. Her most current EKG result is abnormal. She is to have echocardiogram done. Waiting  For an appointment. Upon discharge, patient remains mute. No significant changes noted in her mutism.  Physical Findings: AIMS: Facial and Oral Movements Muscles of Facial Expression: None, normal Lips and Perioral Area: None, normal Jaw: None, normal Tongue: None, normal,Extremity Movements Upper (arms, wrists, hands, fingers): None, normal Lower (legs,  knees, ankles, toes): None, normal, Trunk Movements Neck, shoulders, hips: None, normal, Overall Severity Severity of abnormal movements (highest score from questions above): None, normal Incapacitation due to abnormal movements: None, normal Patient's awareness of abnormal movements (rate only patient's report): No Awareness, Dental Status Current problems with teeth and/or dentures?: No Does patient usually wear  dentures?: No  CIWA:    COWS:     Musculoskeletal: Strength & Muscle Tone: within normal limits Gait & Station: normal Patient leans: N/A  Psychiatric Specialty Exam: Review of Systems  Constitutional: Negative.   HENT: Negative.   Eyes: Negative.   Respiratory: Negative.   Cardiovascular: Negative.   Gastrointestinal: Negative.   Genitourinary: Negative.   Musculoskeletal: Negative.   Skin: Negative.   Endo/Heme/Allergies: Negative.   Psychiatric/Behavioral: Positive for depression. Negative for suicidal ideas, hallucinations and substance abuse.    Blood pressure 148/62, pulse 108, temperature 98.6 F (37 C), temperature source Oral, resp. rate 20, SpO2 98 %.There is no weight on file to calculate BMI.  See Md's SRA  Have you used any form of tobacco in the last 30 days? (Cigarettes, Smokeless Tobacco, Cigars, and/or Pipes): No  Has this patient used any form of tobacco in the last 30 days? (Cigarettes, Smokeless Tobacco, Cigars, and/or Pipes) Yes, No  Blood Alcohol level:  Lab Results  Component Value Date   [facility] <5 12/08/2015   ETH <5 12/22/2014    Metabolic Disorder Labs:  Lab Results  Component Value Date   HGBA1C 5.2 12/25/2014   No results found for: PROLACTIN Lab Results  Component Value Date   CHOL 136 12/25/2014   TRIG 65 12/25/2014   HDL 45 12/25/2014   CHOLHDL 3.0 12/25/2014   VLDL 13 12/25/2014   LDLCALC 78 12/25/2014    See Psychiatric Specialty Exam and Suicide Risk Assessment completed by Attending Physician prior to discharge.  Discharge destination:  Other:  [facility] for ECT  Is patient on multiple antipsychotic therapies at discharge:  No   Has Patient had three or more failed trials of antipsychotic monotherapy by history:  No  Recommended Plan for Multiple Antipsychotic Therapies: NA    Medication List    TAKE these medications      Indication   acetaminophen 500 MG tablet  Commonly known as:  TYLENOL  Take 1 tablet (500 mg total)  by mouth every 6 (six) hours as needed for moderate pain or headache.   Indication:  Fever, Pain     benztropine 0.5 MG tablet  Commonly known as:  COGENTIN  Take 1 tablet (0.5 mg total) by mouth every 8 (eight) hours as needed for tremors (eps).   Indication:  Extrapyramidal Reaction caused by Medications     escitalopram 10 MG tablet  Commonly known as:  LEXAPRO  Take 1 tablet (10 mg total) by mouth daily. For depression   Indication:  Major Depressive Disorder     feeding supplement (ENSURE ENLIVE) Liqd  Take 237 mLs by mouth as needed (If patient is eating <50% of meals). Nutrional supplement      haloperidol 5 MG tablet  Commonly known as:  HALDOL  Take 1 tablet (5 mg total) by mouth every 8 (eight) hours as needed for agitation (severe anxiety).   Indication:  Agitation, psychosis, anxiety     LORazepam 1 MG tablet  Commonly known as:  ATIVAN  Take 1 tablet (1 mg total) by mouth 3 (three) times daily. For anxiety   Indication:  Feeling Anxious  meloxicam 15 MG tablet  Commonly known as:  MOBIC  Take 1 tablet (15 mg total) by mouth daily. For arthritis   Indication:  Joint Damage causing Pain and Loss of Function     OLANZapine 2.5 MG tablet  Commonly known as:  ZYPREXA  Take 1 tablet (2.5 mg total) by mouth 3 (three) times daily at 8am, 2pm and bedtime. For mood control   Indication:  Mood control     traZODone 50 MG tablet  Commonly known as:  DESYREL  Take 1 tablet (50 mg total) by mouth at bedtime and may repeat dose one time if needed. For insomnia   Indication:  Trouble Sleeping           Follow-up Information    Follow up with Pincus Sanes, MD.   Specialty:  Internal Medicine   Why:  If symptoms persist   Contact information:   [address] [place] [address] [phone]       Follow up with [facility]   Specialty:  Emergency Medicine   Why:  As needed, if symptoms worsen   Contact information:   [address] [id number] [address] [place] [address] [phone]     Follow-up recommendations: Activity:  As tolerated Diet: As recommended by your primary care doctor. Keep all scheduled follow-up appointments as recommended.   Comments: Take all your medications as prescribed by your mental healthcare provider. Report any adverse effects and or reactions from your medicines to your outpatient provider promptly. Patient is instructed and cautioned to not engage in alcohol and or illegal drug use while on prescription medicines. In the event of worsening symptoms, patient is instructed to call the crisis hotline, 911 and or go to the nearest ED for appropriate evaluation and treatment of symptoms. Follow-up with your primary care provider for your other medical issues, concerns and or health care needs.   Signed: Sanjuana Kava, NP, PMHNP, 12/09/2015, 2:58 PM

## 2015-12-09 NOTE — Progress Notes (Signed)
Patient ID: Alyssa LeavensSerkalem Steig, female   DOB: 1971-03-20, 45 y.o.   MRN: [id number] Transferred via [facility] with tech assist to [facility] for E[facility] per Dr Elna BreslowEappen. Vol paperwork and Emtala included with her tech transport assist.

## 2015-12-09 NOTE — ED Notes (Signed)
Pt is predominately nonverbal.  Will follow commands.

## 2015-12-09 NOTE — B[facility] Suicide Risk Assessment (Signed)
[facility] INPATIENT:  Family/Significant Other Suicide Prevention Education  Suicide Prevention Education:  Education Completed; NO one has been identified by the patient as the family member/significant other with whom the patient will be residing, and identified as the person(s) who will aid the patient in the event of a mental health crisis (suicidal ideations/suicide attempt).  With written consent from the patient, the family member/significant other has been provided the following suicide prevention education, prior to the and/or following the discharge of the patient.  The suicide prevention education provided includes the following:  Suicide risk factors  Suicide prevention and interventions  National Suicide Hotline telephone number  [facility] assessment telephone number  [facility] Emergency Assistance 911  [facility] and/or Residential Mobile Crisis Unit telephone number  Request made of family/significant other to:  Remove weapons (e.g., guns, rifles, knives), all items previously/currently identified as safety concern.    Remove drugs/medications (over-the-counter, prescriptions, illicit drugs), all items previously/currently identified as a safety concern.  The family member/significant other verbalizes understanding of the suicide prevention education information provided.  The family member/significant other agrees to remove the items of safety concern listed above. The patient did not endorse SI at the time of admission, nor did the patient c/o SI during the stay here.  SPE not required. Called husband, Mesfin DecloHaile, [phone], to inform him that pt is being transferred.  Gave him number and address for hospital.  Ida Rogueorth, Lakesia Dahle B 12/09/2015, 3:00 PM

## 2015-12-09 NOTE — Tx Team (Signed)
Initial Interdisciplinary Treatment Plan   PATIENT STRESSORS: Health problems Medication change or noncompliance   PATIENT STRENGTHS: Ability for insight Communication skills Supportive family/friends   PROBLEM LIST: Problem List/Patient Goals Date to be addressed Date deferred Reason deferred Estimated date of resolution  Depression  12/09/2015     Speaks  Amharic 12/09/2015     Schizophernia 12/09/2015                                          DISCHARGE CRITERIA:  Ability to meet basic life and health needs Improved stabilization in mood, thinking, and/or behavior  PRELIMINARY DISCHARGE PLAN: Outpatient therapy Return to previous living arrangement  PATIENT/FAMIILY INVOLVEMENT: This treatment plan has been presented to and reviewed with the patient, Rhona LeavensSerkalem Molock, and/or family member,  The patient and family have been given the opportunity to ask questions and make suggestions.  Marquerite Forsman A Lilia Letterman 12/09/2015, 7:14 PM

## 2015-12-09 NOTE — Progress Notes (Signed)
  [facility] :  Will you be returning to the same living situation after discharge:  No. [facility] At discharge, do you have transportation home?: Yes,  [place] Do you have the ability to pay for your medications: Yes,  insurance  Release of information consent forms completed and in the chart;  Patient's signature needed at discharge.  Patient to Follow up at: Follow-up Information    Follow up with [facility] SanesStacy J Burns, MD.   Specialty:  Internal Medicine   Why:  If symptoms persist   Contact information:   [address] [place] [id number] [phone]       Follow up with [facility]   Specialty:  Emergency Medicine   Why:  As needed, if symptoms worsen   Contact information:   [address] [id number] [place] [id number] [phone]      Follow up with [facility].   Why:  [place] to provide transportation today at [employer]:15   Contact information:   [address] [id number] [place] [id number] [phone]      Next level of care provider has access to [facility] Link:yes  Safety Planning and Suicide Prevention discussed: No.  Have you used any form of tobacco in the last 30 days? (Cigarettes, Smokeless Tobacco, Cigars, and/or Pipes): No  Has patient been referred to the Quitline?: N/A patient is not a smoker  Patient has been referred for addiction treatment: N/A  Ida Rogueorth, Alyssa Avila, 2:58 PM

## 2015-12-09 NOTE — BHH Suicide Risk Assessment (Signed)
[facility]   Principal Problem: MDD (major depressive disorder), recurrent, with catatonic features [facility](HCC) Discharge Diagnoses:  Patient Active Problem List   Diagnosis Date Noted  . 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 Hx of schizophrenia [Z86.59] 12/09/2015  . Right shoulder pain [M25.511] 06/18/2015  . 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 rate 20, SpO2 98 %.There is no weight on file to calculate BMI.    General Appearance: casual, lays in bed   Eye Contact:: Fair  Speech: was able to say one or two words - usually in whispers - otherwise mute  Volume: Decreased  Mood: is mute  Affect: Depressed  Thought Process: pt is mute  Orientation: Other: is mute  Thought Content: is mute  Suicidal Thoughts: did not express any  Homicidal Thoughts: did not express any  Memory: is mute  Judgement: Other: mute  Insight: is mute  Psychomotor Activity: Decreased  Concentration: Poor  Recall: mute  Fund of Knowledge:is mute  Language: is mute   Akathisia: No  Handed: Right  AIMS (if indicated):    Assets: Others: access to health care  ADL's: Impaired  Cognition: grossly impaired   Sleep:                                                            Mental Status Per Nursing Assessment::   On Admission:  NA (pt unable to answer question)  Demographic Factors:  Unemployed  Loss Factors: NA  Historical Factors: NA  Risk Reduction Factors:   Positive social  support  Continued Clinical Symptoms:  Previous Psychiatric Diagnoses and Treatments  Cognitive Features That Contribute To Risk:  Pt is mute    Suicide Risk:  Moderate:  Frequent suicidal ideation with limited intensity, and duration, some specificity in terms of plans, no associated intent, good self-control, limited dysphoria/symptomatology, some risk factors present, and identifiable protective factors, including available and accessible social support.  Follow-up Information    [facility]                                    Plan Of Care/Follow-up recommendations:  Activity:  no restrictions Diet:  regular Tests:  none Other:  [facility]  Jazyiah Yiu, MD 12/09/2015, 2:23 PM

## 2015-12-09 NOTE — Discharge Instructions (Signed)
Nonspecific Tachycardia Tachycardia is a faster than normal heartbeat (more than 100 beats per minute). In adults, the heart normally beats between 60 and 100 times a minute. A fast heartbeat may be a normal response to exercise or stress. It does not necessarily mean that something is wrong. However, sometimes when your heart beats too fast it may not be able to pump enough blood to the rest of your body. This can result in chest pain, shortness of breath, dizziness, and even fainting. Nonspecific tachycardia means that the specific cause or pattern of your tachycardia is unknown. CAUSES  Tachycardia may be harmless or it may be due to a more serious underlying cause. Possible causes of tachycardia include:  Exercise or exertion.  Fever.  Pain or injury.  Infection.  Loss of body fluids (dehydration).  Overactive thyroid.  Lack of red blood cells (anemia).  Anxiety and stress.  Alcohol.  Caffeine.  Tobacco products.  Diet pills.  Illegal drugs.  Heart disease. SYMPTOMS  Rapid or irregular heartbeat (palpitations).  Suddenly feeling your heart beating (cardiac awareness).  Dizziness.  Tiredness (fatigue).  Shortness of breath.  Chest pain.  Nausea.  Fainting. DIAGNOSIS  Your caregiver will perform a physical exam and take your medical history. In some cases, a heart specialist (cardiologist) may be consulted. Your caregiver may also order:  Blood tests.  Electrocardiography. This test records the electrical activity of your heart.  A heart monitoring test. TREATMENT  Treatment will depend on the likely cause of your tachycardia. The goal is to treat the underlying cause of your tachycardia. Treatment methods may include:  Replacement of fluids or blood through an intravenous (IV) tube for moderate to severe dehydration or anemia.  New medicines or changes in your current medicines.  Diet and lifestyle changes.  Treatment for certain  infections.  Stress relief or relaxation methods. HOME CARE INSTRUCTIONS   Rest.  Drink enough fluids to keep your urine clear or pale yellow.  Do not smoke.  Avoid:  Caffeine.  Tobacco.  Alcohol.  Chocolate.  Stimulants such as over-the-counter diet pills or pills that help you stay awake.  Situations that cause anxiety or stress.  Illegal drugs such as marijuana, phencyclidine (PCP), and cocaine.  Only take medicine as directed by your caregiver.  Keep all follow-up appointments as directed by your caregiver. SEEK IMMEDIATE MEDICAL CARE IF:   You have pain in your chest, upper arms, jaw, or neck.  You become weak, dizzy, or feel faint.  You have palpitations that will not go away.  You vomit, have diarrhea, or pass blood in your stool.  Your skin is cool, pale, and wet.  You have a fever that will not go away with rest, fluids, and medicine. MAKE SURE YOU:   Understand these instructions.  Will watch your condition.  Will get help right away if you are not doing well or get worse.   This information is not intended to replace advice given to you by your health care provider. Make sure you discuss any questions you have with your health care provider.   Document Released: 09/16/2004 Document Revised: 11/01/2011 Document Reviewed: 02/21/2015 Elsevier Interactive Patient Education 2016 Elsevier Inc.  

## 2015-12-09 NOTE — Progress Notes (Signed)
Patient ID: Alyssa LeavensSerkalem Madura, female   DOB: 1971-03-21, 45 y.o.   MRN: [id number] Checking on her approximately every 15 min and at that time trying to get her to drink water or Ensure. She does follow direction and will take one or two sips at a time. She did take the new medications ordered, including Ativan, Lexapro, and Zyprexa. She continues to be non verbal. However, the tech and nurse that speak with an African accent she will more frequently respond to them with one or two words spoken softly.  Rod, Art gallery managersocial worker informed writer she is being [facility]. Rod is making transport arrangements. Called report to unit. Preparing for transfer.

## 2015-12-10 ENCOUNTER — Ambulatory Visit (HOSPITAL_COMMUNITY): Payer: BLUE CROSS/BLUE SHIELD

## 2015-12-10 DIAGNOSIS — F333 Major depressive disorder, recurrent, severe with psychotic symptoms: Principal | ICD-10-CM

## 2015-12-10 LAB — HEMOGLOBIN A1C: Hgb A1c MFr Bld: 5.1 % (ref 4.0–6.0)

## 2015-12-10 LAB — LIPID PANEL
CHOLESTEROL: 122 mg/dL (ref 0–200)
HDL: 40 mg/dL — ABNORMAL LOW (ref >40)
LDL Cholesterol: 73 mg/dL (ref 0–99)
TRIGLYCERIDES: 46 mg/dL (ref <150)
Total CHOL/HDL Ratio: 3.1 RATIO
VLDL: 9 mg/dL (ref 0–40)

## 2015-12-10 LAB — TSH: TSH: 1.644 u[IU]/mL (ref 0.350–4.500)

## 2015-12-10 MED ORDER — ENSURE ENLIVE PO LIQD
237.0000 mL | Freq: Two times a day (BID) | ORAL | Status: DC
  Administered 2015-12-10 – 2015-12-14 (×5): 237 mL via ORAL

## 2015-12-10 MED ORDER — OLANZAPINE 5 MG PO TBDP
10.0000 mg | ORAL_TABLET | Freq: Every day | ORAL | Status: DC
  Administered 2015-12-10 – 2015-12-11 (×2): 10 mg via ORAL
  Filled 2015-12-10 (×2): qty 2

## 2015-12-10 MED ORDER — ESCITALOPRAM OXALATE 10 MG PO TABS
10.0000 mg | ORAL_TABLET | Freq: Every day | ORAL | Status: DC
  Administered 2015-12-10 – 2015-12-12 (×3): 10 mg via ORAL
  Filled 2015-12-10 (×3): qty 1

## 2015-12-10 NOTE — BHH Suicide Risk Assessment (Signed)
[facility]   Nursing information obtained from:    review of inpatient nursing intake as well as notes from previous hospitalization and referral source Demographic factors:    patient is an adult woman with no history of suicide attempts. Diagnosis of severe Maj. depression with psychotic symptoms. No substance abuse Current Mental Status:    patient is withdrawn with flat affect minimal interaction decreased by mouth intake appears to be confused. Not agitated. Not threatening suicide. Loss Factors:    no known recent losses Historical Factors:    no known past suicide attempts Risk Reduction Factors:    patient is a mother of several children and appears to be devoted to her family  Total Time spent with patient: 1 hour Principal Problem: Depression, major, recurrent, severe with psychosis (HCC) Diagnosis:   Patient Active Problem List   Diagnosis Date Noted  . 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 Hx of schizophrenia [Z86.59] 12/09/2015  . 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 Right shoulder pain [M25.511] 06/18/2015  . 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 Diagnosis is likely to be major depression severe with psychotic features. She denies any wish to die. States an intention to go home and continue taking care of her family. Has not acted out in a suicidal manner  Continued Clinical Symptoms:  Alcohol Use Disorder Identification Test Final Score (AUDIT): 0 The "Alcohol Use Disorders Identification Test", Guidelines for Use in Primary Care, Second Edition.  World Science writerHealth Organization [facility](WHO). Score between 0-7:  no or low risk or alcohol related problems. Score between 8-15:  moderate risk of alcohol related problems. Score between 16-19:  high risk of alcohol related  problems. Score 20 or above:  warrants further diagnostic evaluation for alcohol dependence and treatment.   CLINICAL FACTORS:   Depression:   Anhedonia Severe   Musculoskeletal: Strength & Muscle Tone: within normal limits Gait & Station: normal Patient leans: N/A  Psychiatric Specialty Exam: Review of Systems  Constitutional: Negative.   HENT: Negative.   Eyes: Negative.   Respiratory: Negative.   Cardiovascular: Negative.   Gastrointestinal: Negative.   Musculoskeletal: Negative.   Skin: Negative.   Neurological: Negative.   Psychiatric/Behavioral: Negative for depression, suicidal ideas, hallucinations, memory loss and substance abuse. The patient is nervous/anxious and has insomnia.     Blood pressure 148/101, pulse 92, temperature 98.5 F (36.9 C), temperature source Oral, resp. rate 18, height 5' 4.17" (1.63 m), weight 61.236 kg (135 lb), SpO2 99 %.Body mass index is 23.05 kg/(m^2).  General Appearance: Fairly Groomed  Patent attorneyye Contact::  Minimal  Speech:  Blocked  Volume:  Decreased  Mood:  Dysphoric  Affect:  Flat  Thought Process:  Negative  Orientation:  Full (Time, Place, and Person)  Thought Content:  Rumination  Suicidal Thoughts:  No  Homicidal Thoughts:  No  Memory:  Immediate;   Good Recent;   Poor Remote;   Fair  Judgement:  Impaired  Insight:  Shallow  Psychomotor Activity:  Decreased  Concentration:  Fair  Recall:  [employer]  Fund of Knowledge:Fair  Language: Fair  Akathisia:  No  Handed:  Right  AIMS (if indicated):     Assets:  Housing Physical Health Resilience  Sleep:  Number of Hours: 4  Cognition: Impaired,  Mild  ADL's:  Impaired    COGNITIVE FEATURES THAT CONTRIBUTE TO RISK:  Closed-mindedness    SUICIDE RISK:   Minimal: No identifiable suicidal ideation.  Patients presenting with no risk factors but with morbid ruminations; may be classified as minimal risk based on the severity of the depressive symptoms  PLAN OF CARE: Patient is  admitted to the psychiatric ward. We are restarting appropriate medicine for psychotic depression. She is on 15 minute checks. We have discussed ECT as a possible option. We will make sure she is monitored daily for by mouth intake and we will try and make sure she is engaged in appropriate groups and has good outpatient follow-up for discharge.  I certify that inpatient services furnished can reasonably be expected to improve the patient's condition.   Mordecai Rasmussen, MD 12/10/2015, 7:12 PM

## 2015-12-10 NOTE — Progress Notes (Signed)
Patient with depressed affect, withdrawn and sleepy in bed rt recent admission. No SI/HI at this time. Patient refuses meals and water and nutritional shakes encouraged with fair effect. Patient with fair eye contact, quiet speech. Writer phones pacific interpreters and speaks with amaric interpretor to assess patient. Patient denies SI/HI at this time. Patient states her goal is to see her children. Encouraged patient to talk with children on the phone patient states no "I want to see my children". Patient states she has lived in [place] for 10 [place] and is from [place]. Writer reviews current plan of care to patient and she remains with flat affect. Po fluids encouraged and meal trays are vegetarian. Patient returns to bed to rest. Safety maintained.

## 2015-12-10 NOTE — H&P (Signed)
Psychiatric Admission Assessment Adult  Patient Identification: Alyssa Avila MRN:  [id number] Date of Evaluation:  12/10/2015 Chief Complaint:  Major Depression Principal Diagnosis: <principal problem not specified> Diagnosis:   Patient Active Problem List   Diagnosis Date Noted  . 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 Hx of schizophrenia [Z86.59] 12/09/2015  . 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 Right shoulder pain [M25.511] 06/18/2015  . 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 Patient has a history of depression with psychosis or possible schizophrenia and had been off of her medication recently. Appears to of had a decline and a return to not eating or drinking, withdrawn and confused behavior flat affect IN the last few weeks. Patient was able to offer limited history because of her mental status. She said that she did not feel depressed but felt like her chief complaint was a fluttering in her chest. When asked to elaborate she was not able to give any more detail. She denied any wish to die and denied having any hallucinations. Admitted that she had not been eating or drinking well for a few weeks and could give no answer as to why that was the case. Not able to identify any particular stressor. Denies abuse of alcohol or drugs. Denies recent trauma. Vague about answers to questions about current psychotic symptoms. Associated Signs/Symptoms: Depression Symptoms:  anhedonia, psychomotor retardation, difficulty concentrating, impaired memory, weight loss, decreased appetite, (Hypo) Manic Symptoms:  Nothing evident Anxiety Symptoms:  Difficult to gauge because of lack of insight. Patient appears to be brooding and anxious but is not able to  describe her thought content in detail Psychotic Symptoms:  Disorganized thinking. PTSD Symptoms: Negative Total Time spent with patient: 1 hour  Past Psychiatric History: Patient has a history of prior hospitalizations at [facility]. Also has had prior hospitalizations at our facility. Diagnosis in the past has been variously schizophrenia or major depression with psychotic features. As I read the history and the diagnosis of Maj. depression with psychotic features appears to be more likely correct given how well she was doing as recently as a few months ago when she tapered off of her medicine. Patient does not have a known history of suicide attempts. Had been treated with olanzapine and Lexapro in the past with good response and tolerance.  Is the patient at risk to self? Yes.    Has the patient been a risk to self in the past 6 months? Yes.    Has the patient been a risk to self within the distant past? Yes.    Is the patient a risk to others? No.  Has the patient been a risk to others in the past 6 months? No.  Has the patient been a risk to others within the distant past? No.   Prior Inpatient Therapy:   prior history of inpatient treatment at local hospitals as noted above on more than one occasion with diagnosis variously of major depression or schizophrenia Prior Outpatient Therapy:   patient has seen Dr.Plovsky for outpatient treatment after hospitalization  Alcohol Screening: 1. How often do you have a drink containing alcohol?: Never 2. How many drinks containing alcohol do you have on a typical day when you are drinking?: 1 or 2 3. How often do you have six or more drinks on one occasion?:  Never Preliminary Score: 0 4. How often during the last year have you found that you were not able to stop drinking once you had started?: Never 5. How often during the last year have you failed to do what was normally expected from you becasue of drinking?: Never 6. How often  during the last year have you needed a first drink in the morning to get yourself going after a heavy drinking session?: Never 7. How often during the last year have you had a feeling of guilt of remorse after drinking?: Never 8. How often during the last year have you been unable to remember what happened the night before because you had been drinking?: Never 9. Have you or someone else been injured as a result of your drinking?: No 10. Has a relative or friend or a doctor or another health worker been concerned about your drinking or suggested you cut down?: No Alcohol Use Disorder Identification Test Final Score (AUDIT): 0 Brief Intervention: AUDIT score less than 7 or less-screening does not suggest unhealthy drinking-brief intervention not indicated Substance Abuse History in the last 12 months:  No. Consequences of Substance Abuse: Negative Previous Psychotropic Medications: Yes  Psychological Evaluations: Yes  Past Medical History:  Past Medical History  Diagnosis Date  . Schizophrenia (HCC)   . Depression    History reviewed. No pertinent past surgical history. Family History: History reviewed. No pertinent family history. Family Psychiatric  History: Patient denies knowing of any family history of mental health problems or substance abuse problems Tobacco Screening: [phone])::1)@ Social History:  History  Alcohol Use No     History  Drug Use No    Additional Social History:                           Allergies:  No Known Allergies Lab Results:  Results for orders placed or performed during the hospital encounter of 12/09/15 (from the past 48 hour(s))  Hemoglobin A1c     Status: None   Collection Time: 12/10/15  7:25 AM  Result Value Ref Range   Hgb A1c MFr Bld 5.1 4.0 - 6.0 %  Lipid panel     Status: Abnormal   Collection Time: 12/10/15  7:25 AM  Result Value Ref Range   Cholesterol 122 0 - 200 mg/dL   Triglycerides 46 <098 mg/dL   HDL 40 (L) >11  mg/dL   Total CHOL/HDL Ratio 3.1 RATIO   VLDL 9 0 - 40 mg/dL   LDL Cholesterol 73 0 - 99 mg/dL    Comment:        Total Cholesterol/HDL:CHD Risk Coronary Heart Disease Risk Table                     Men   Women  1/2 Average Risk   3.4   3.3  Average Risk       5.0   4.4  2 X Average Risk   9.6   7.1  3 X Average Risk  23.4   11.0        Use the calculated Patient Ratio above and the CHD Risk Table to determine the patient's CHD Risk.        ATP III CLASSIFICATION (LDL):  <100     mg/dL   Optimal  914-782  mg/dL   Near or Above  Optimal  130-159  mg/dL   Borderline  130-865  mg/dL   High  >784     mg/dL   Very High   TSH     Status: None   Collection Time: 12/10/15  7:25 AM  Result Value Ref Range   TSH 1.644 0.350 - 4.500 uIU/mL    Blood Alcohol level:  Lab Results  Component Value Date   ETH <5 12/08/2015   ETH <5 12/22/2014    Metabolic Disorder Labs:  Lab Results  Component Value Date   HGBA1C 5.1 12/10/2015   No results found for: PROLACTIN Lab Results  Component Value Date   CHOL 122 12/10/2015   TRIG 46 12/10/2015   HDL 40* 12/10/2015   CHOLHDL 3.1 12/10/2015   VLDL 9 12/10/2015   LDLCALC 73 12/10/2015   LDLCALC 78 12/25/2014    Current Medications: Current Facility-Administered Medications  Medication Dose Route Frequency Provider Last Rate Last Dose  . acetaminophen (TYLENOL) tablet 650 mg  650 mg Oral Q6H PRN Audery Amel, MD      . alum & mag hydroxide-simeth (MAALOX/MYLANTA) 200-200-20 MG/5ML suspension 30 mL  30 mL Oral Q4H PRN Audery Amel, MD      . escitalopram (LEXAPRO) tablet 10 mg  10 mg Oral Daily Owain Eckerman T Shaquayla Klimas, MD      . feeding supplement (ENSURE ENLIVE) (ENSURE ENLIVE) liquid 237 mL  237 mL Oral BID BM Audery Amel, MD   237 mL at 12/10/15 1400  . magnesium hydroxide (MILK OF MAGNESIA) suspension 30 mL  30 mL Oral Daily PRN Audery Amel, MD      . OLANZapine zydis (ZYPREXA) disintegrating tablet 10 mg  10  mg Oral QHS Audery Amel, MD       PTA Medications: Prescriptions prior to admission  Medication Sig Dispense Refill Last Dose  . acetaminophen (TYLENOL) 500 MG tablet Take 1 tablet (500 mg total) by mouth every 6 (six) hours as needed for moderate pain or headache. 30 tablet 0   . benztropine (COGENTIN) 0.5 MG tablet Take 1 tablet (0.5 mg total) by mouth every 8 (eight) hours as needed for tremors (eps).     Marland Kitchen escitalopram (LEXAPRO) 10 MG tablet Take 1 tablet (10 mg total) by mouth daily. For depression     . feeding supplement, ENSURE ENLIVE, (ENSURE ENLIVE) LIQD Take 237 mLs by mouth as needed (If patient is eating <50% of meals). Nutrional supplement 237 mL 12   . haloperidol (HALDOL) 5 MG tablet Take 1 tablet (5 mg total) by mouth every 8 (eight) hours as needed for agitation (severe anxiety).     . LORazepam (ATIVAN) 1 MG tablet Take 1 tablet (1 mg total) by mouth 3 (three) times daily. For anxiety 1 tablet 0   . meloxicam (MOBIC) 15 MG tablet Take 1 tablet (15 mg total) by mouth daily. For arthritis 1 tablet 0   . OLANZapine (ZYPREXA) 2.5 MG tablet Take 1 tablet (2.5 mg total) by mouth 3 (three) times daily at 8am, 2pm and bedtime. For mood control     . traZODone (DESYREL) 50 MG tablet Take 1 tablet (50 mg total) by mouth at bedtime and may repeat dose one time if needed. For insomnia       Musculoskeletal: Strength & Muscle Tone: within normal limits Gait & Station: normal Patient leans: N/A  Psychiatric Specialty Exam: Physical Exam  Nursing note and vitals reviewed. Constitutional: She appears well-developed and well-nourished.  HENT:  Head: Normocephalic and atraumatic.  Eyes: Conjunctivae are normal. Pupils are equal, round, and reactive to light.  Neck: Normal range of motion. Neck supple.  Cardiovascular: Normal rate, regular rhythm and normal heart sounds.   Respiratory: Effort normal and breath sounds normal. No respiratory distress.  GI: Soft.  Musculoskeletal:  Normal range of motion.  Neurological: She is alert. She has normal reflexes. No cranial nerve deficit.  Skin: Skin is warm and dry.  Psychiatric: Her affect is blunt. Her speech is delayed. She is slowed and withdrawn. She expresses inappropriate judgment. She is noncommunicative. She exhibits abnormal recent memory.  Patient's thought content was difficult to judge. Spoke minimally. Did not answer questions in great detail.    Review of Systems  Constitutional: Negative.   HENT: Negative.   Eyes: Negative.   Respiratory: Negative.   Cardiovascular: Negative.   Gastrointestinal: Negative.   Musculoskeletal: Negative.   Skin: Negative.   Neurological: Negative.   Psychiatric/Behavioral: Negative for depression, suicidal ideas, hallucinations, memory loss and substance abuse. The patient is nervous/anxious. The patient does not have insomnia.     Blood pressure 148/101, pulse 92, temperature 98.5 F (36.9 C), temperature source Oral, resp. rate 18, height 5' 4.17" (1.63 m), weight 61.236 kg (135 lb), SpO2 99 %.Body mass index is 23.05 kg/(m^2).  General Appearance: Fairly Groomed  Patent attorney::  Minimal  Speech:  Blocked and Slow  Volume:  Decreased  Mood:  Dysphoric  Affect:  Constricted and Flat  Thought Process:  Disorganized and Loose  Orientation:  Full (Time, Place, and Person)  Thought Content:  Rumination  Suicidal Thoughts:  No  Homicidal Thoughts:  No  Memory:  Immediate;   Fair Recent;   Poor Remote;   Fair  Judgement:  Impaired  Insight:  Shallow  Psychomotor Activity:  Decreased  Concentration:  Fair  Recall:  [employer] of Knowledge:Fair  Language: Fair  Akathisia:  No  Handed:  Right  AIMS (if indicated):     Assets:  Desire for Improvement Financial Resources/Insurance Housing Physical Health Resilience Social Support  ADL's:  Impaired  Cognition: Impaired,  Mild  Sleep:  Number of Hours: 4     Treatment Plan Summary: Daily contact with patient  to assess and evaluate symptoms and progress in treatment, Medication management and Plan Patient is a 45 year old woman who is had multiple episodes of psychotic decompensation going back years and has responded well to medications. Has been tapered off and not taking any psychiatric medicine for the last few months. Has returned to what appears to be depression although the patient's insight and inability to recognize her own condition is limited. Patient was transferred to our hospital with a recommendation of consideration for electroconvulsive therapy. Although the patient's condition would be a positive indication for ECT I discussed this with her today and she stated clearly several times that she would not agree to this treatment. Evaluation and discussion of ECT was somewhat limited by the patient's lack of the [place] language. We communicated using an interpreter service over the telephone in her native language. Nevertheless the patient was clear in stating that she did not want to have ECT. She stated that she would prefer to take her medicine. Plan will be to restart Zyprexa at 10 mg a night and Lexapro at 20 mg a day. Monitor food intake monitor behavior. I have not spoken to her family and we will try and get some more collateral history. We will continue to discuss ECT  as time goes along but at this point it does not appear to be appropriate to force the issue.  Observation Level/Precautions:  15 minute checks  Laboratory:  Chemistry Profile  Psychotherapy:  Patient will be engaged in daily evaluation and individual therapy as well as daily group psychotherapy of multiple modalities   Medications:  As noted above I am restarting Lexapro and olanzapine   Consultations:  No consultation needed at this point yet   Discharge Concerns:  We will need to ensure that she is back in regular contact with her outpatient psychiatrist   Estimated LOS: 5-7 days   Other:     I certify that inpatient  services furnished can reasonably be expected to improve the patient's condition.    Mordecai Rasmussen, MD 4/19/20176:58 PM

## 2015-12-10 NOTE — Progress Notes (Signed)
D: Patient is very flat. For most of the evening she sat in dining room with her head on the table. An interpreter was used on the phone for assessment purposes. She denies SI/HI/AVH at this time. She denies pain but she appeared to have pain in her right arm. During the middle of the night patient went to the end of the [facility] and pushed on the door appearing like she was trying to leave. She did not get aggressive and just appeared confused.  A: Patient had no scheduled medication. Encouragement was provided. Redirection was done. Patient was educated that she was NPO for ECT and labs past midnight. R: During assessment patient stated she was vegetarian so orders were put in. She remains calm and cooperative. Safety maintained with 15 min checks.

## 2015-12-10 NOTE — [facility] Group Notes (Signed)
[facility] Group Notes:  (Nursing/MHT/Case Management/Adjunct)  Date:  12/10/2015  Time:  5:27 PM  Type of Therapy:  Psychoeducational Skills  Participation Level:  Did Not Attend    Lawanda Holzheimer M Moses Ellison 12/10/2015, 5:27 PM 

## 2015-12-10 NOTE — Progress Notes (Addendum)
Patient agrees to drink 120 ml water and 240 ml nutritional shake so she can get better and go home. Dr. Toni Amendlapacs into assess and evaluate patient at this time and to use interpretor. Patient safety maintained. Interpretor phone number is [phone] and patient's language is Amaric.

## 2015-12-10 NOTE — Plan of Care (Signed)
Problem: Ineffective individual coping Goal: LTG: Patient will report a decrease in negative feelings Outcome: Progressing No SI/HI at this time.      

## 2015-12-10 NOTE — Progress Notes (Signed)
NUTRITION ASSESSMENT  Pt identified as at risk on the Malnutrition Screen Tool  INTERVENTION: 1. Encourage fluid and food intake 2. Supplements: offer EnsureEnlive 3. Encourage menu completion to best meet pt preferences; vegetarian diet has been ordered by staff per pt preferences  Please consult RD if further intervention is wanted  NUTRITION DIAGNOSIS: Unintentional weight loss related to sub-optimal intake as evidenced by pt report.   Goal: Pt to meet >/= 90% of their estimated nutrition needs.  Monitor:  PO intake  Assessment:   45 y.o. female admitted with MDD, recurrent with catatonic features. Pt with hx of shizophrenia. Pt presents with worsening symptoms of depression, being mute and not taking any food or fluids for at least 3 days. Plan for ECT  Height: Ht Readings from Last 1 Encounters:  12/09/15 5' 4.17" (1.63 m)    Weight: Wt Readings from Last 1 Encounters:  12/09/15 135 lb (61.236 kg)    Weight Hx: weight down since November; 6% wt loss Wt Readings from Last 10 Encounters:  12/09/15 135 lb (61.236 kg)  07/23/15 144 lb 9.6 oz (65.59 kg)  06/18/15 143 lb (64.864 kg)  04/23/15 140 lb (63.504 kg)  04/10/15 140 lb (63.504 kg)  03/19/15 138 lb (62.596 kg)  12/31/14 138 lb (62.596 kg)  12/22/14 139 lb 1.6 oz (63.095 kg)  12/09/14 144 lb (65.318 kg)    BMI:  Body mass index is 23.05 kg/(m^2).   Estimated Nutritional Needs: Kcal: 25-30 kcal/kg Protein: > 1 gram protein/kg Fluid: 1 ml/kcal  Diet Order: Diet vegetarian Room service appropriate?: Yes; Fluid consistency:: Thin  No recorded po intake Pt is also offered choice of scheduled unit snacks.  Pt is eating as desired.   Lab results and medications reviewed.   Romelle Starcherate Amarianna Abplanalp MS, RD, LDN [phone] Pager  [phone] Weekend/On-Call Pager

## 2015-12-10 NOTE — Plan of Care (Signed)
Problem: Ineffective individual coping Goal: STG: Patient will remain free from self harm Outcome: Progressing Patient has remained free since admission.

## 2015-12-11 LAB — COMPREHENSIVE METABOLIC PANEL
ALK PHOS: 34 U/L — AB (ref 38–126)
ALT: 13 U/L — ABNORMAL LOW (ref 14–54)
ANION GAP: 7 (ref 5–15)
AST: 16 U/L (ref 15–41)
Albumin: 4.4 g/dL (ref 3.5–5.0)
BUN: 19 mg/dL (ref 6–20)
CALCIUM: 9.4 mg/dL (ref 8.9–10.3)
CO2: 24 mmol/L (ref 22–32)
Chloride: 110 mmol/L (ref 101–111)
Creatinine, Ser: 0.5 mg/dL (ref 0.44–1.00)
Glucose, Bld: 124 mg/dL — ABNORMAL HIGH (ref 65–99)
Potassium: 3.4 mmol/L — ABNORMAL LOW (ref 3.5–5.1)
SODIUM: 141 mmol/L (ref 135–145)
TOTAL PROTEIN: 7.4 g/dL (ref 6.5–8.1)
Total Bilirubin: 0.7 mg/dL (ref 0.3–1.2)

## 2015-12-11 LAB — PROLACTIN: Prolactin: 8.2 ng/mL (ref 4.8–23.3)

## 2015-12-11 NOTE — Tx Team (Signed)
Interdisciplinary Treatment Plan Update (Adult)  Date:  12/11/2015 Time Reviewed:  4:13 PM  Progress in Treatment: Attending groups: No. Participating in groups:  No. Taking medication as prescribed:  Yes. Tolerating medication:  Yes. Family/Significant othe contact made:  No, will contact:  husband  Patient understands diagnosis:  No. and As evidenced by:  limited insight  Discussing patient identified problems/goals with staff:  Yes. Medical problems stabilized or resolved:  Yes. Denies suicidal/homicidal ideation: Yes. Issues/concerns per patient self-inventory:  Yes. Other:  New problem(s) identified: Yes, Describe:  NA  Discharge Plan or Barriers:Pt plans to return home and follow up with outpatient.    Reason for Continuation of Hospitalization: Delusions  Depression Medication stabilization Suicidal ideation  Comments:45 year old woman transferred to [place] from [facility] for possible evaluation for ECT treatment. Patient has a history of depression with psychosis or possible schizophrenia and had been off of her medication recently. Appears to of had a decline and a return to not eating or drinking, withdrawn and confused behavior flat affect IN the last few weeks. Patient was able to offer limited history because of her mental status. She said that she did not feel depressed but felt like her chief complaint was a fluttering in her chest. When asked to elaborate she was not able to give any more detail. She denied any wish to die and denied having any hallucinations. Admitted that she had not been eating or drinking well for a few weeks and could give no answer as to why that was the case. Not able to identify any particular stressor. Denies abuse of alcohol or drugs. Denies recent trauma. Vague about answers to questions about current psychotic symptoms  Estimated length of stay: 7 days   New goal(s): NA  Review of initial/current patient goals per problem list:   1.   Goal(s): Patient will participate in aftercare plan * Met:  * Target date: at discharge * As evidenced by: Patient will participate within aftercare plan AEB aftercare provider and housing plan at discharge being identified.   2.  Goal (s): Patient will exhibit decreased depressive symptoms and suicidal ideations. * Met:  *  Target date: at discharge * As evidenced by: Patient will utilize self rating of depression at 3 or below and demonstrate decreased signs of depression or be deemed stable for discharge by MD.   3.  Goal(s): Patient will demonstrate decreased signs and symptoms of anxiety. * Met:  * Target date: at discharge * As evidenced by: Patient will utilize self rating of anxiety at 3 or below and demonstrated decreased signs of anxiety, or be deemed stable for discharge by MD  4.  Goal (s): Patient will demonstrate decreased symptoms of psychosis. * Met: No  *  Target date: at discharge * As evidenced by: Patient will not endorse signs of psychosis or be deemed stable for discharge by MD. *   Attendees: Patient:  Alyssa Avila 4/20/20174:13 PM  Family:   4/20/20174:13 PM  Physician:  Dr. Weber Cooks  4/20/20174:13 PM  Nursing:   Silva Bandy, RN  4/20/20174:13 PM  Case Manager:   4/20/20174:13 PM  Counselor:   4/20/20174:13 PM  Other:  Wray Kearns , LCSWA  4/20/20174:13 PM  Other:  Everitt Amber, [place]  4/20/20174:13 PM  Other:   4/20/20174:13 PM  Other:  4/20/20174:13 PM  Other:  4/20/20174:13 PM  Other:  4/20/20174:13 PM  Other:  4/20/20174:13 PM  Other:  4/20/20174:13 PM  Other:  4/20/20174:13 PM  Other:  4/20/20174:13 PM   Scribe for Treatment Team:   Wray Kearns, MSW, LCSWA  12/11/2015, 4:13 PM

## 2015-12-11 NOTE — Progress Notes (Signed)
D:  Patient is alert and questionably oriented on the unit this shift.  Patient did not attend groups today.  Patient can neither confirm nor deny suicidal ideation, homicidal ideation, auditory or visual hallucinations at the present time.   A:  Scheduled medications are administered to patient as per MD orders.  Emotional support and encouragement are provided.  Patient is maintained on q.15 minute safety checks.  Patient is informed to notify staff with questions or concerns. R:  No adverse medication reactions are noted.  Patient is cooperative with medication administration today.  Patient is receptive, calm and cooperative on the unit at this time.  Patient does not interact with others on the unit this shift.   Patient remains safe at this time.

## 2015-12-11 NOTE — Plan of Care (Signed)
Problem: Ineffective individual coping Goal: STG-Increase in ability to manage activities of daily living Outcome: Progressing Patient is managing her activities of daily living currently     

## 2015-12-11 NOTE — [facility] Group Notes (Signed)
[facility] Group Notes:  (Nursing/MHT/Case Management/Adjunct)  Date:  12/11/2015  Time:  6:32 PM  Type of Therapy:  Music Therapy  Participation Level:  Did Not Attend  Arel Tippen De'Chelle Eri Mcevers 12/11/2015, 6:32 PM

## 2015-12-11 NOTE — Progress Notes (Signed)
[facility] MD Progress Note  12/11/2015 7:53 PM Alyssa Avila  MRN:  [id number] Subjective:  Patient today reports no specific new complaints. Insight remains impaired. This is a 45-year-old woman with a history of major depression recurrent with psychotic features. Had been off her medicines for several months and showed a return of depression with loss of appetite loss of function mental slowing disorganized thinking. Patient was transferred to our hospital because of the possibility of catatonia requiring ECT. I have had to interview her using a telephone-based interpreter system to speak in her native language. Patient met today with treatment team and with me. Had a few minor and disorganized physical complaints. Poor insight into her mood and behavior. Patient however today expressed being slightly more open to the possibility of ECT. She said she would have to speak with her husband. She has been compliant with medication so far. She is eating but not very much. Principal Problem: Depression, major, recurrent, severe with psychosis (HCC) Diagnosis:   Patient Active Problem List   Diagnosis Date Noted  . 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 Hx of schizophrenia [Z86.59] 12/09/2015  . 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 Right shoulder pain [M25.511] 06/18/2015  . 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 She has responded to medication in the past. No known history of suicide attempts  Past Medical History:  Past Medical History  Diagnosis Date  . Schizophrenia (HCC)   . Depression    History reviewed. No pertinent past surgical history. Family History: History reviewed. No pertinent family history. Family Psychiatric   History: Patient denies there being any family history of mental illness Social History:  History  Alcohol Use No     History  Drug Use No    Social History   Social History  . Marital Status: Married    Spouse Name: N/A  . Number of Children: N/A  . Years of Education: N/A   Social History Main Topics  . Smoking status: Never Smoker   . Smokeless tobacco: Never Used  . Alcohol Use: No  . Drug Use: No  . Sexual Activity: Not Asked   Other Topics Concern  . None   Social History Narrative   Additional Social History:                         Sleep: Fair  Appetite:  Poor  Current Medications: Current Facility-Administered Medications  Medication Dose Route Frequency Provider Last Rate Last Dose  . acetaminophen (TYLENOL) tablet 650 mg  650 mg Oral Q6H PRN  T , MD      . alum & mag hydroxide-simeth (MAALOX/MYLANTA) 200-200-20 MG/5ML suspension 30 mL  30 mL Oral Q4H PRN  T , MD      . escitalopram (LEXAPRO) tablet 10 mg  10 mg Oral Daily  T , MD   10 mg at 12/11/15 1007  . feeding supplement (ENSURE ENLIVE) (ENSURE ENLIVE) liquid 237 mL  237 mL Oral BID BM  T , MD   237 mL at 12/10/15 1400  . magnesium hydroxide (MILK OF MAGNESIA) suspension 30 mL  30 mL Oral Daily PRN  T , MD      . OLANZapine zydis (ZYPREXA) disintegrating tablet 10 mg  10 mg Oral   QHS  T , MD   10 mg at 12/10/15 2201    Lab Results:  Results for orders placed or performed during the hospital encounter of 12/09/15 (from the past 48 hour(s))  Hemoglobin A1c     Status: None   Collection Time: 12/10/15  7:25 AM  Result Value Ref Range   Hgb A1c MFr Bld 5.1 4.0 - 6.0 %  Lipid panel     Status: Abnormal   Collection Time: 12/10/15  7:25 AM  Result Value Ref Range   Cholesterol 122 0 - 200 mg/dL   Triglycerides 46 <150 mg/dL   HDL 40 (L) >40 mg/dL   Total CHOL/HDL Ratio 3.1 RATIO   VLDL 9 0 - 40 mg/dL   LDL Cholesterol 73 0  - 99 mg/dL    Comment:        Total Cholesterol/HDL:CHD Risk Coronary Heart Disease Risk Table                     Men   Women  1/2 Average Risk   3.4   3.3  Average Risk       5.0   4.4  2 X Average Risk   9.6   7.1  3 X Average Risk  23.4   11.0        Use the calculated Patient Ratio above and the CHD Risk Table to determine the patient's CHD Risk.        ATP III CLASSIFICATION (LDL):  <100     mg/dL   Optimal  100-129  mg/dL   Near or Above                    Optimal  130-159  mg/dL   Borderline  160-189  mg/dL   High  >190     mg/dL   Very High   Prolactin     Status: None   Collection Time: 12/10/15  7:25 AM  Result Value Ref Range   Prolactin 8.2 4.8 - 23.3 ng/mL    Comment: (NOTE) Performed At: [facility] [address] [place], [place] [id number] [place] F MD Ph:[phone]   TSH     Status: None   Collection Time: 12/10/15  7:25 AM  Result Value Ref Range   TSH 1.644 0.350 - 4.500 uIU/mL  Comprehensive metabolic panel     Status: Abnormal   Collection Time: 12/11/15  9:00 AM  Result Value Ref Range   Sodium 141 135 - 145 mmol/L   Potassium 3.4 (L) 3.5 - 5.1 mmol/L   Chloride 110 101 - 111 mmol/L   CO2 24 22 - 32 mmol/L   Glucose, Bld 124 (H) 65 - 99 mg/dL   BUN 19 6 - 20 mg/dL   Creatinine, Ser 0.50 0.44 - 1.00 mg/dL   Calcium 9.4 8.9 - 10.3 mg/dL   Total Protein 7.4 6.5 - 8.1 g/dL   Albumin 4.4 3.5 - 5.0 g/dL   AST 16 15 - 41 U/L   ALT 13 (L) 14 - 54 U/L   Alkaline Phosphatase 34 (L) 38 - 126 U/L   Total Bilirubin 0.7 0.3 - 1.2 mg/dL   GFR calc non Af Amer >60 >60 mL/min   GFR calc Af Amer >60 >60 mL/min    Comment: (NOTE) The eGFR has been calculated using the CKD EPI equation. This calculation has not been validated in all clinical situations. eGFR's persistently <60 mL/min signify possible Chronic Kidney Disease.      Anion gap 7 5 - 15    Blood Alcohol level:  Lab Results  Component Value Date   ETH <5 12/08/2015   ETH <5  12/22/2014    Physical Findings: AIMS:  , ,  ,  ,    CIWA:    COWS:     Musculoskeletal: Strength & Muscle Tone: within normal limits Gait & Station: normal Patient leans: N/A  Psychiatric Specialty Exam: Review of Systems  Constitutional: Negative.   HENT: Negative.   Eyes: Negative.   Respiratory: Negative.   Cardiovascular: Negative.   Gastrointestinal: Negative.   Musculoskeletal: Negative.   Skin: Negative.   Neurological: Negative.   Psychiatric/Behavioral: Positive for memory loss. Negative for depression, suicidal ideas, hallucinations and substance abuse. The patient is nervous/anxious and has insomnia.     Blood pressure 117/54, pulse 100, temperature 98 F (36.7 C), temperature source Oral, resp. rate 18, height 5' 4.17" (1.63 m), weight 61.236 kg (135 lb), SpO2 99 %.Body mass index is 23.05 kg/(m^2).  General Appearance: Fairly Groomed  Eye Contact::  Fair  Speech:  Slow  Volume:  Decreased  Mood:  Dysphoric  Affect:  Patient looks confused and blunted and dysphoric  Thought Process:  Circumstantial and Disorganized  Orientation:  Full (Time, Place, and Person)  Thought Content:  Negative  Suicidal Thoughts:  No  Homicidal Thoughts:  No  Memory:  Immediate;   Fair Recent;   Poor Remote;   Fair  Judgement:  Impaired  Insight:  Shallow  Psychomotor Activity:  Decreased  Concentration:  Poor  Recall:  Fair  Fund of Knowledge:Fair  Language: Fair  Akathisia:  No  Handed:  Right  AIMS (if indicated):     Assets:  Financial Resources/Insurance Housing Physical Health Social Support  ADL's:  Impaired  Cognition: Impaired,  Mild  Sleep:  Number of Hours: 6.5   Treatment Plan Summary: Daily contact with patient to assess and evaluate symptoms and progress in treatment, Medication management and Plan Patient met with the treatment team today. She was unable to identify any real goals to treatment. She continues to look infused much of the time but she is  taking care of her basic hygiene. She is eating although she is not eating complete meals. She has been compliant so far with medication. Patient had declined ECT yesterday. We discussed it again today a treatment team. She did not seem quite as opposed to it today but said she would need to speak to her husband. We will continue to keep that option open while continuing medication management and group therapy for now. Continue olanzapine and Lexapro.   , MD 12/11/2015, 7:53 PM  

## 2015-12-11 NOTE — [facility] Group Notes (Signed)
[facility] LCSW Group Therapy  12/11/2015 3:50 PM  Type of Therapy:  Group Therapy  Participation Level:  Did Not Attend  Modes of Intervention:  Discussion, Education, Socialization and Support  Summary of Progress/Problems: Balance in life: Patients will discuss the concept of balance and how it looks and feels to be unbalanced. Pt will identify areas in their life that is unbalanced and ways to become more balanced.    [name] L Unique Searfoss MSW, LCSWA  12/11/2015, 3:50 PM  

## 2015-12-11 NOTE — Plan of Care (Signed)
Problem: Ineffective individual coping Goal: STG: Patient will remain free from self harm Outcome: Progressing Patient remains free from self harm currently     

## 2015-12-11 NOTE — Progress Notes (Signed)
D: Pt denies SI/HI/AVH, via interpreter, Pt is pleasant and cooperative with treatment plan,  affect is flat and sad, isolates to room and those not interact with peers. Pt appears anxious and was offered support and encouragement. Pt was given scheduled medications. Pt was encouraged to attend groups. Q 15 minute checks were done for safety.  R:Pt did not attends groups. Pt is taking medication, safety maintained on unit.

## 2015-12-11 NOTE — Plan of Care (Signed)
Problem: Alteration in mood Goal: LTG-Patient reports reduction in suicidal thoughts (Patient reports reduction in suicidal thoughts and is able to verbalize a safety plan for whenever patient is feeling suicidal)  Outcome: Progressing Patient denies SI/HI.      

## 2015-12-12 MED ORDER — ESCITALOPRAM OXALATE 10 MG PO TABS
20.0000 mg | ORAL_TABLET | Freq: Every day | ORAL | Status: DC
  Administered 2015-12-13 – 2015-12-16 (×4): 20 mg via ORAL
  Filled 2015-12-12 (×4): qty 2

## 2015-12-12 MED ORDER — OLANZAPINE 5 MG PO TBDP
15.0000 mg | ORAL_TABLET | Freq: Every day | ORAL | Status: DC
  Administered 2015-12-12: 15 mg via ORAL
  Filled 2015-12-12: qty 3

## 2015-12-12 NOTE — [facility] Counselor (Signed)
CSW attempted PSA. Pt was unable to participate.   Daisy FloroCandace L Tracie Dore MSW, LCSWA  12/12/2015 2:24 PM

## 2015-12-12 NOTE — Plan of Care (Signed)
Problem: Ineffective individual coping Goal: LTG: Patient will report a decrease in negative feelings Outcome: Progressing Patient reports that she feels better today than she did yesterday

## 2015-12-12 NOTE — Progress Notes (Signed)
[facility] MD Progress Note  12/12/2015 6:20 PM Alyssa Avila  MRN:  [id number] Subjective:  Follow-up Friday the 21st. Patient interviewed with the assistance of an interpreter in her native language using the telephone-based interpreter service. Patient appeared to be a little bit more relaxed and open in the interview but just buy a little bit. As far as I can tell a lot of the things that she said this evening didn't make much sense. She did tell me that she didn't feel much different than she did yesterday. She said some things that seem to acknowledge some psychiatric problems. She said that she had a lot of worry and that she was worried that the things that she was thinking were going to hurt her. She ate knowledge that she was feeling afraid but could not say what she was feeling afraid of. She did say that she was trying to eat a little more. She had no new physical complaints. I tried to bring up ECT with her again. I discovered that she had no idea what I was talking about so I attempted to explain again what ECT and what the purpose would be. Patient was given opportunity to ask questions. She still seems slightly confused. Principal Problem: Depression, major, recurrent, severe with psychosis ([place]) Diagnosis:   Patient Active Problem List   Diagnosis Date Noted  . MDD (major depressive disorder), recurrent, with catatonic features ([place]) [F33.9, F06.1] 12/09/2015  . Hx of schizophrenia [Z86.59] 12/09/2015  . Depression, major, recurrent, severe with psychosis ([place]) [F33.3] 12/09/2015  . Right shoulder pain [M25.511] 06/18/2015  . 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 She has responded to medication in the past. No known history of suicide attempts  Past Medical History:  Past Medical History  Diagnosis Date  . Schizophrenia  ([place])   . Depression    History reviewed. No pertinent past surgical history. Family History: History reviewed. No pertinent family history. Family Psychiatric  History: Patient denies there being any family history of mental illness Social History:  History  Alcohol Use No     History  Drug Use No    Social History   Social History  . Marital Status: Married    Spouse Name: N/A  . Number of Children: N/A  . Years of Education: N/A   Social History Main Topics  . Smoking status: Never Smoker   . Smokeless tobacco: Never Used  . Alcohol Use: No  . Drug Use: No  . Sexual Activity: Not Asked   Other Topics Concern  . None   Social History Narrative   Additional Social History:                         Sleep: Fair  Appetite:  Poor  Current Medications: Current Facility-Administered Medications  Medication Dose Route Frequency Provider Last Rate Last Dose  . acetaminophen (TYLENOL) tablet 650 mg  650 mg Oral Q6H PRN Gonzella Lex, MD      . alum & mag hydroxide-simeth (MAALOX/MYLANTA) 200-200-20 MG/5ML suspension 30 mL  30 mL Oral Q4H PRN Gonzella Lex, MD      . Derrill Memo ON 12/13/2015] escitalopram (LEXAPRO) tablet 20 mg  20 mg Oral Daily Graceanne Guin T Mohamad Bruso, MD      . feeding supplement (ENSURE ENLIVE) (ENSURE ENLIVE) liquid 237 mL  237  mL Oral BID BM Gonzella Lex, MD   237 mL at 12/12/15 1531  . magnesium hydroxide (MILK OF MAGNESIA) suspension 30 mL  30 mL Oral Daily PRN Gonzella Lex, MD      . OLANZapine zydis (ZYPREXA) disintegrating tablet 15 mg  15 mg Oral QHS Gonzella Lex, MD        Lab Results:  Results for orders placed or performed during the hospital encounter of 12/09/15 (from the past 48 hour(s))  Comprehensive metabolic panel     Status: Abnormal   Collection Time: 12/11/15  9:00 AM  Result Value Ref Range   Sodium 141 135 - 145 mmol/L   Potassium 3.4 (L) 3.5 - 5.1 mmol/L   Chloride 110 101 - 111 mmol/L   CO2 24 22 - 32 mmol/L   Glucose,  Bld 124 (H) 65 - 99 mg/dL   BUN 19 6 - 20 mg/dL   Creatinine, Ser 0.50 0.44 - 1.00 mg/dL   Calcium 9.4 8.9 - 10.3 mg/dL   Total Protein 7.4 6.5 - 8.1 g/dL   Albumin 4.4 3.5 - 5.0 g/dL   AST 16 15 - 41 U/L   ALT 13 (L) 14 - 54 U/L   Alkaline Phosphatase 34 (L) 38 - 126 U/L   Total Bilirubin 0.7 0.3 - 1.2 mg/dL   GFR calc non Af Amer >60 >60 mL/min   GFR calc Af Amer >60 >60 mL/min    Comment: (NOTE) The eGFR has been calculated using the CKD EPI equation. This calculation has not been validated in all clinical situations. eGFR's persistently <60 mL/min signify possible Chronic Kidney Disease.    Anion gap 7 5 - 15    Blood Alcohol level:  Lab Results  Component Value Date   ETH <5 12/08/2015   ETH <5 12/22/2014    Physical Findings: AIMS:  , ,  ,  ,    CIWA:    COWS:     Musculoskeletal: Strength & Muscle Tone: within normal limits Gait & Station: normal Patient leans: N/A  Psychiatric Specialty Exam: Review of Systems  Constitutional: Negative.   HENT: Negative.   Eyes: Negative.   Respiratory: Negative.   Cardiovascular: Negative.   Gastrointestinal: Negative.   Musculoskeletal: Negative.   Skin: Negative.   Neurological: Negative.   Psychiatric/Behavioral: Positive for memory loss. Negative for depression, suicidal ideas, hallucinations and substance abuse. The patient is nervous/anxious and has insomnia.     Blood pressure 117/82, pulse 96, temperature 97.8 F (36.6 C), temperature source Oral, resp. rate 18, height 5' 4.17" (1.63 m), weight 61.236 kg (135 lb), SpO2 99 %.Body mass index is 23.05 kg/(m^2).  General Appearance: Fairly Groomed  Engineer, water::  Fair  Speech:  Slow  Volume:  Decreased  Mood:  Dysphoric  Affect:  Patient looks confused and blunted and dysphoric  Thought Process:  Circumstantial and Disorganized  Orientation:  Full (Time, Place, and Person)  Thought Content:  Negative  Suicidal Thoughts:  No  Homicidal Thoughts:  No  Memory:   Immediate;   Fair Recent;   Poor Remote;   Fair  Judgement:  Impaired  Insight:  Shallow  Psychomotor Activity:  Decreased  Concentration:  Poor  Recall:  [place]  Language: Fair  Akathisia:  No  Handed:  Right  AIMS (if indicated):     Assets:  Financial Resources/Insurance Housing Physical Health Social Support  ADL's:  Impaired  Cognition: Impaired,  Mild  Sleep:  Number of Hours: 8.25   Treatment Plan Summary: Daily contact with patient to assess and evaluate symptoms and progress in treatment, Medication management and Plan She did complain to me that she slept only a few minutes at night. Nursing log seems to contradict that saying she sleeps multiple hours. They are charting that she eats usually somewhere between 25 and 75% of her meals which looks probably about right when I saw her dinner plate. Her husband attempted to call me this afternoon through the main hospital switchboard. I called back immediately but when they tried to connect me the line was empty. I have tried to call back using both of the numbers listed in the chart. The cell phone number does not except incoming calls. On the home number I left a voice message. Patient does not appear to be worse. She is taking her medicine. She is eating a little bit. Assessment is difficult with the language. Her and the way that she speaks very little. I do get the impression that she is still quite symptomatic but I would like to get outside opinions from her family. We will continue current medicine and in fact I have increased the dose of the olanzapine and the Lexapro. I hope that I can get to speak with the husband soon. Follow-up into next week. ECT is still a possibility but the earliest opportunity at this point would be Wednesday.  Alethia Berthold, MD 12/12/2015, 6:20 PM

## 2015-12-12 NOTE — Plan of Care (Signed)
Problem: Alteration in mood Goal: LTG-Patient reports reduction in suicidal thoughts (Patient reports reduction in suicidal thoughts and is able to verbalize a safety plan for whenever patient is feeling suicidal)  Outcome: Progressing Pt denies SI at this time     

## 2015-12-12 NOTE — Progress Notes (Signed)
D:  Patient is alert and questionably oriented on the unit this shift.  Patient did not attend groups today.  Patient denies suicidal ideation, homicidal ideation, auditory or visual hallucinations at the present time.  Patient reports that she feels better today than she did yesterday. A:  Scheduled medications are administered to patient as per MD orders.  Emotional support and encouragement are provided.  Patient is maintained on q.15 minute safety checks.  Patient is informed to notify staff with questions or concerns. R:  No adverse medication reactions are noted.  Patient is cooperative with medication administration and treatment plan today.  Patient is receptive, calm and cooperative on the unit at this time.  Patient does not interact with others on the unit this shift.  Patient contracts for safety at this time.  Patient remains safe at this time.

## 2015-12-12 NOTE — Progress Notes (Signed)
Recreation Therapy Notes  Date: 04.21.17 Time: 1:00 pm Location: Craft Room  Group Topic: Self-expression, Coping skills  Goal Area(s) Addresses:  Patient will effectively use art as a means of self-expression. Patient will recognize positive benefit of self-expression. Patient will be able to identify one emotion experienced during group. Patient will identify use of art as a coping skill.  Behavioral Response: Did not attend  Intervention: Two Faces of Me  Activity: Patients were given a blank face worksheet and instructed to draw a line down the middle. On one side of the face, patients were instructed to draw or write how they felt when they were admitted to the hospital. On the other side of the face, patients were instructed to draw or write how they want to feel when they are d/c.  Education: LRT educated patients on different forms of self-expression.  Education Outcome: Patient did not attend group.  Clinical Observations/Feedback: Patient did not attend group.  Joud Ingwersen M, LRT/CTRS 12/12/2015 2:06 PM 

## 2015-12-12 NOTE — [facility] Group Notes (Signed)
[facility] Group Notes:  (Nursing/MHT/Case Management/Adjunct)  Date:  12/12/2015  Time:  5:13 PM  Type of Therapy:  Psychoeducational Skills  Participation Level:  Did Not Attend   Lynelle SmokeCara Travis [facility] Psych[facility] HospitalMadoni 12/12/2015, 5:13 PM

## 2015-12-12 NOTE — Plan of Care (Signed)
Problem: Ineffective individual coping Goal: STG: Patient will remain free from self harm Outcome: Progressing Pt remains free from harm  Problem: Alteration in mood Goal: LTG-Patient reports reduction in suicidal thoughts (Patient reports reduction in suicidal thoughts and is able to verbalize a safety plan for whenever patient is feeling suicidal)  Outcome: Progressing Pt denies SI at this time     

## 2015-12-12 NOTE — Progress Notes (Signed)
D: Pt affect is flat this evening. She is able to verbalize needs to staff appropriately. Denies SI/HI/AVH at this time. Pt c/o headache which she rates 7/10. She refuses PRN medication. A: Emotional support and encouragement provided. Medications administered with education. q15 minute safety checks maintained. R: Pt remains free from harm. Will continue to monitor.

## 2015-12-12 NOTE — Plan of Care (Signed)
Problem: Ineffective individual coping Goal: STG: Patient will remain free from self harm Outcome: Progressing Patient remains free from self harm currently     

## 2015-12-12 NOTE — [facility] Group Notes (Signed)
[facility] LCSW Group Therapy  12/12/2015 2:21 PM  Type of Therapy:  Group Therapy  Participation Level:  Did Not Attend  Modes of Intervention:  Discussion, Education, Socialization and Support  Summary of Progress/Problems: Feelings around Relapse. Group members discussed the meaning of relapse and shared personal stories of relapse, how it affected them and others, and how they perceived themselves during this time. Group members were encouraged to identify triggers, warning signs and coping skills used when facing the possibility of relapse. Social supports were discussed and explored in detail.   Alyssa Avila  MSW, LCSWA   12/12/2015, 2:21 PM   

## 2015-12-12 NOTE — Progress Notes (Signed)
D: Pt denies SI/HI/AVH. Pt is pleasant and cooperative. Pt stated she was feeling a little better, but not much better,pt could not tell why. Pt seen on milieu, but minimal interaction.   A: Pt was offered support and encouragement. Pt was given scheduled medications. Pt was encourage to attend groups. Q 15 minute checks were done for safety.   R: Pt is taking medication. Pt has no complaints.Pt receptive to treatment and safety maintained on unit.

## 2015-12-13 MED ORDER — OLANZAPINE 10 MG PO TABS
15.0000 mg | ORAL_TABLET | Freq: Every day | ORAL | Status: DC
  Administered 2015-12-13 – 2015-12-15 (×3): 15 mg via ORAL
  Filled 2015-12-13 (×3): qty 2

## 2015-12-13 NOTE — Progress Notes (Signed)
Presents with sad affect.  Denies being depressed/sad.  States "I'm tired"  Appetite is poor .  Visible in the milieu, not interaction with peers.  Reluctant to take medications but agreeable to do so.  Support and encouragement offered.  Safety maintained.

## 2015-12-13 NOTE — Progress Notes (Signed)
D: Pt denies SI/HI/AVH, patient speaks limited English, but can follow simple directions. Pt's affect is flat, and sad, appears sometimes to be suspicious of staff during medication pass, writer reassured patient that she needed the medication and it was prescribed by her attending doctor.Patient is interacting appropriately with staff but has limited interaction with peers because of language barrier.   A: Pt was offered support and encouragement. Pt was given scheduled medications. Pt was encouraged to attend groups. Q 15 minute checks were done for safety.  R:Pt attended group and interacted  well with peers and staff. Pt is complaint with medication. Pt receptive to treatment and safety maintained on unit.

## 2015-12-13 NOTE — Progress Notes (Signed)
[facility]  12/13/2015 12:10 PM Alyssa Avila  MRN:  [id number] Subjective:  Patient was seen today without an interpreter. Patient was able to understand my questions and was able to answer back in an organized manner. She tells me she is feeling a little better than prior to admission. She complains that her medications are making her tired. Her mood is still sad, her appetite is poor and her energy is very low. She feels that her sleep has improved significantly and she says she has been is sleeping well. She denies suicidal ideation and denies any hallucinations. Today she says that her right shoulder had been hurting.  Per nursing staff and the patient refused to take the whole dose of olanzapine last night. She was prescribed with 15 mg but only agree with taking 10. However she was insisting on only taking 5.   Principal Problem: Depression, major, recurrent, severe with psychosis (HCC) Diagnosis:   Patient Active Problem List   Diagnosis Date Noted  . 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 Hx of schizophrenia [Z86.59] 12/09/2015  . 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 Right shoulder pain [M25.511] 06/18/2015  . 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 She has responded to medication in the past. No known history of suicide attempts  Past Medical History:  Past Medical History  Diagnosis Date  . Schizophrenia (HCC)   . Depression    History reviewed. No pertinent past surgical history. Family History: History reviewed. No pertinent family history. Family Psychiatric  History: Patient denies there being any family history of mental illness Social History:  History  Alcohol Use No     History  Drug  Use No    Social History   Social History  . Marital Status: Married    Spouse Name: N/A  . Number of Children: N/A  . Years of Education: N/A   Social History Main Topics  . Smoking status: Never Smoker   . Smokeless tobacco: Never Used  . Alcohol Use: No  . Drug Use: No  . Sexual Activity: Not Asked   Other Topics Concern  . None   Social History Narrative    Sleep: Good  Appetite:  Poor  Current Medications: Current Facility-Administered Medications  Medication Dose Route Frequency Provider Last Rate Last Dose  . acetaminophen (TYLENOL) tablet 650 mg  650 mg Oral Q6H PRN Audery AmelJohn T Clapacs, MD      . alum & mag hydroxide-simeth (MAALOX/MYLANTA) 200-200-20 MG/5ML suspension 30 mL  30 mL Oral Q4H PRN Audery AmelJohn T Clapacs, MD      . escitalopram (LEXAPRO) tablet 20 mg  20 mg Oral Daily Audery AmelJohn T Clapacs, MD   20 mg at 12/13/15 1013  . feeding supplement (ENSURE ENLIVE) (ENSURE ENLIVE) liquid 237 mL  237 mL Oral BID BM Audery AmelJohn T Clapacs, MD   237 mL at 12/13/15 1055  . magnesium hydroxide (MILK OF MAGNESIA) suspension 30 mL  30 mL Oral Daily PRN Audery AmelJohn T Clapacs, MD      . OLANZapine zydis (ZYPREXA) disintegrating tablet 15 mg  15 mg Oral QHS Audery AmelJohn T Clapacs, MD   15 mg at 12/12/15 2146    Lab Results:  No results found for this or any previous visit (from the past 48 hour(s)).  Blood  Alcohol level:  Lab Results  Component Value Date   ETH <5 12/08/2015   ETH <5 12/22/2014    Physical Findings: AIMS:  , ,  ,  ,    CIWA:    COWS:     Musculoskeletal: Strength & Muscle Tone: within normal limits Gait & Station: normal Patient leans: N/A  Psychiatric Specialty Exam: Review of Systems  Constitutional: Negative.   HENT: Negative.   Eyes: Negative.   Respiratory: Negative.   Cardiovascular: Negative.   Gastrointestinal: Negative.   Genitourinary: Negative.   Musculoskeletal: Negative.   Skin: Negative.   Neurological: Negative.   Endo/Heme/Allergies: Negative.    Psychiatric/Behavioral: Positive for depression and memory loss. Negative for suicidal ideas, hallucinations and substance abuse. The patient is nervous/anxious. The patient does not have insomnia.     Blood pressure 120/78, pulse 93, temperature 98 F (36.7 C), temperature source Oral, resp. rate 18, height 5' 4.17" (1.63 m), weight 61.236 kg (135 lb), SpO2 99 %.Body mass index is 23.05 kg/(m^2).  General Appearance: Fairly Groomed  Patent attorney::  Fair  Speech:  Slow  Volume:  Decreased  Mood:  Dysphoric  Affect:  Patient looks confused and blunted and dysphoric  Thought Process:  Circumstantial and Disorganized  Orientation:  Full (Time, Place, and Person)  Thought Content:  Negative  Suicidal Thoughts:  No  Homicidal Thoughts:  No  Memory:  Immediate;   Fair Recent;   Poor Remote;   Fair  Judgement:  Impaired  Insight:  Shallow  Psychomotor Activity:  Decreased  Concentration:  Poor  Recall:  Fair  Fund of Knowledge:Fair  Language: Fair  Akathisia:  No  Handed:  Right  AIMS (if indicated):     Assets:  Financial Resources/Insurance Housing Physical Health Social Support  ADL's:  Impaired  Cognition: Impaired,  Mild  Sleep:  Number of Hours: 8.15   Treatment Plan Summary: Daily contact with patient to assess and evaluate symptoms and progress in treatment, Medication management and Plan She did complain to me that she slept only a few minutes at night. Nursing log seems to contradict that saying she sleeps multiple hours. They are charting that she eats usually somewhere between 25 and 75% of her meals which looks probably about right when I saw her dinner plate. Her husband attempted to call me this afternoon through the main hospital switchboard. I called back immediately but when they tried to connect me the line was empty. I have tried to call back using both of the numbers listed in the chart. The cell phone number does not except incoming calls. On the home number I  left a voice message. Patient does not appear to be worse. She is taking her medicine. She is eating a little bit. Assessment is difficult with the language. Her and the way that she speaks very little. I do get the impression that she is still quite symptomatic but I would like to get outside opinions from her family. We will continue current medicine and in fact I have increased the dose of the olanzapine and the Lexapro. I hope that I can get to speak with the husband soon. Follow-up into next week. ECT is still a possibility but the earliest opportunity at this point would be Wednesday.   Major depressive disorder: Continue with olanzapine 50 mg a day and Lexapro 20 mg a day. Patient has been refusing to take all lasting orally disintegrating tablet. I suspect she is not taking the whole dose because  the nurses giving her 3 tablets of 5 mg. I plan to change it to Zyprexa regular tablets so patient will only have to take 1 tablet.  Jimmy Footman, MD 12/13/2015, 12:10 PM

## 2015-12-13 NOTE — Plan of Care (Signed)
Problem: Alteration in mood Goal: LTG-Patient reports reduction in suicidal thoughts (Patient reports reduction in suicidal thoughts and is able to verbalize a safety plan for whenever patient is feeling suicidal)  Outcome: Progressing Patient denies SI/HI.      

## 2015-12-13 NOTE — Progress Notes (Signed)
Pt was concerned that she took 15 mg of Zyprexa this evening. Pt stated I only took 10 yesterday. Pt was concerned that it was too much, pt only wanted to take 5 mg. Pt was informed that this was what the dr was recommending for her, so she did take it with encouragement.

## 2015-12-14 MED ORDER — ENSURE ENLIVE PO LIQD
237.0000 mL | Freq: Three times a day (TID) | ORAL | Status: DC
  Administered 2015-12-14 – 2015-12-16 (×6): 237 mL via ORAL

## 2015-12-14 NOTE — [facility] Group Notes (Signed)
[facility] LCSW Group Therapy  12/14/2015 8:42 AM  Type of Therapy:  Group Therapy  Participation Level:  Active  Participation Quality:  Appropriate  Affect:  Appropriate  Cognitive:  Appropriate  Insight:  Developing/Improving  Engagement in Therapy:  Developing/Improving  Modes of Intervention:  Discussion, Exploration and Problem-solving  Summary of Progress/Problems:LCSW conducted group therapy outdoors and reviewed group rules. Each group member was asked to introduce themselves and discuss an obstacle they face when it comes to their illness and how they overcome those obstacles. Group members supported each other during this process. LCSW provided therapeutic and motivational questioning and humour. This patient reported her obstacle as being overly stressed with language barrier. She was supported by many of her peers. She disclosed she is [place].    Arrie SenateBandi, [place] Condon M 12/14/2015, 8:42 AM

## 2015-12-14 NOTE — Progress Notes (Signed)
D:  Per pt self inventory pt reports sleeping fair, appetite fair, energy level low, ability to pay attention poor, rates depression at a 5 out of 10, hopelessness at a 5 out of 10, anxiety at a 0 out of 10, denies SI/HI/AVH, goal today: "go to groups", flat/anxious during interaction, appropriate, med compliant.     A:  Emotional support provided, Encouraged pt to continue with treatment plan and attend all group activities, q15 min checks maintained for safety.  R:  Pt is receptive, going to groups, pleasant and cooperative with staff and other patients on the unit.

## 2015-12-14 NOTE — [facility] Group Notes (Signed)
[facility] LCSW Group Therapy  12/14/2015 5:40 PM  Type of Therapy:  Group Therapy  Participation Level:  Active  Participation Quality:  Appropriate  Affect:  Appropriate  Cognitive:  Appropriate  Insight:  Developing/Improving  Engagement in Therapy:  Developing/Improving  Modes of Intervention:  Discussion, Education, Exploration and Support  Summary of Progress/Problems:LCSW created a supportive group environment and reviewed group rules with patients. Topic for today was emotional regulation and LCSW requested full group participation and patient were to identify when their emotions were regulated and when they are not. LCSW using peers experiences to enlist ideas to assist others. A very supportive discussion ensued and this patient supported her peers.    Alyssa Avila, Alyssa Avila 12/14/2015, 5:40 PM

## 2015-12-14 NOTE — Progress Notes (Signed)
D: Denies SI/AVH. Minimal interaction but visible on milieu. Medication compliant.  A:Encouragement and support provided. Medications given as prescribed. Q15 minute checks maintained for safety.  R: Remains safe on unit. Voices no additional concerns at this time.

## 2015-12-14 NOTE — [facility] Group Notes (Signed)
[facility] Group Notes:  (Nursing/MHT/Case Management/Adjunct)  Date:  12/14/2015  Time:  9:26 PM  Type of Therapy:  Psychoeducational Skills  Participation Level:  Minimal  Participation Quality:  Appropriate  Affect:  Appropriate  Cognitive:  Appropriate  Insight:  Good  Engagement in Group:  Limited  Modes of Intervention:  Exploration and Support  Summary of Progress/Problems:  Benay PikeJamila A Monnie Gudgel 12/14/2015, 9:26 PM

## 2015-12-14 NOTE — Progress Notes (Signed)
[facility] MD Progress Note  12/14/2015 11:00 AM Alyssa Avila  MRN:  [id number] Subjective:  Patient was seen today without an interpreter. Patient was able to understand my questions and was able to answer back in an organized manner. She tells me she is feeling a little better than prior to admission. She did not complain that her medications were making her tired today. She continues to state that her appetite is low but seems to be improving. She was actually seen smiling. The patient has been attending groups. She has been eating better. She denies suicidality, homicidality or having auditory or visual hallucinations.   Per nursing: Pt denies SI/HI/AVH, patient speaks limited English, but can follow simple directions. Pt's affect is flat, and sad, appears sometimes to be suspicious of staff during medication pass, writer reassured patient that she needed the medication and it was prescribed by her attending doctor.Patient is interacting appropriately with staff but has limited interaction with peers because of language barrier.  Principal Problem: Depression, major, recurrent, severe with psychosis (HCC) Diagnosis:   Patient Active Problem List   Diagnosis Date Noted  . 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 Hx of schizophrenia [Z86.59] 12/09/2015  . 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 Right shoulder pain [M25.511] 06/18/2015  . 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 She has responded to medication in the past. No known history of suicide attempts  Past Medical History:  Past Medical History  Diagnosis Date  . Schizophrenia (HCC)   . Depression    History reviewed. No pertinent past surgical history. Family History:  History reviewed. No pertinent family history. Family Psychiatric  History: Patient denies there being any family history of mental illness Social History:  History  Alcohol Use No     History  Drug Use No    Social History   Social History  . Marital Status: Married    Spouse Name: N/A  . Number of Children: N/A  . Years of Education: N/A   Social History Main Topics  . Smoking status: Never Smoker   . Smokeless tobacco: Never Used  . Alcohol Use: No  . Drug Use: No  . Sexual Activity: Not Asked   Other Topics Concern  . None   Social History Narrative    Sleep: Good  Appetite:  Poor  Current Medications: Current Facility-Administered Medications  Medication Dose Route Frequency Provider Last Rate Last Dose  . acetaminophen (TYLENOL) tablet 650 mg  650 mg Oral Q6H PRN Audery Amel, MD      . alum & mag hydroxide-simeth (MAALOX/MYLANTA) 200-200-20 MG/5ML suspension 30 mL  30 mL Oral Q4H PRN Audery Amel, MD      . escitalopram (LEXAPRO) tablet 20 mg  20 mg Oral Daily Audery Amel, MD   20 mg at 12/14/15 0906  . feeding supplement (ENSURE ENLIVE) (ENSURE ENLIVE) liquid 237 mL  237 mL Oral TID BM Jimmy Footman, MD      . magnesium hydroxide (MILK OF MAGNESIA) suspension 30 mL  30 mL Oral Daily PRN Audery Amel, MD   30 mL at 12/13/15 2232  . OLANZapine (ZYPREXA) tablet 15 mg  15 mg Oral QHS Jimmy Footman, MD   15 mg at 12/13/15 2144    Lab Results:  No results  found for this or any previous visit (from the past 48 hour(s)).  Blood Alcohol level:  Lab Results  Component Value Date   ETH <5 12/08/2015   ETH <5 12/22/2014    Physical Findings: AIMS:  , ,  ,  ,    CIWA:    COWS:     Musculoskeletal: Strength & Muscle Tone: within normal limits Gait & Station: normal Patient leans: N/A  Psychiatric Specialty Exam: Review of Systems  Constitutional: Negative.   HENT: Negative.   Eyes: Negative.   Respiratory: Negative.    Cardiovascular: Negative.   Gastrointestinal: Negative.   Genitourinary: Negative.   Musculoskeletal: Negative.   Skin: Negative.   Neurological: Negative.   Endo/Heme/Allergies: Negative.   Psychiatric/Behavioral: Positive for depression. Negative for suicidal ideas, hallucinations, memory loss and substance abuse. The patient is not nervous/anxious and does not have insomnia.     Blood pressure 130/85, pulse 86, temperature 98.2 F (36.8 C), temperature source Oral, resp. rate 18, height 5' 4.17" (1.63 m), weight 61.236 kg (135 lb), SpO2 99 %.Body mass index is 23.05 kg/(m^2).  General Appearance: Fairly Groomed  Patent attorneyye Contact::  Fair  Speech:  Slow  Volume:  Decreased  Mood:  Dysphoric  Affect:  Patient looks confused and blunted and dysphoric  Thought Process:  Circumstantial and Disorganized  Orientation:  Full (Time, Place, and Person)  Thought Content:  Negative  Suicidal Thoughts:  No  Homicidal Thoughts:  No  Memory:  Immediate;   Fair Recent;   Poor Remote;   Fair  Judgement:  Impaired  Insight:  Shallow  Psychomotor Activity:  Decreased  Concentration:  Poor  Recall:  FiservFair  Fund of Knowledge:Fair  Language: Fair  Akathisia:  No  Handed:  Right  AIMS (if indicated):     Assets:  Financial Resources/Insurance Housing Physical Health Social Support  ADL's:  Impaired  Cognition: Impaired,  Mild  Sleep:  Number of Hours: 4.3   Treatment Plan Summary: Daily contact with patient to assess and evaluate symptoms and progress in treatment, Medication management and Plan She did complain to me that she slept only a few minutes at night. Nursing log seems to contradict that saying she sleeps multiple hours. They are charting that she eats usually somewhere between 25 and 75% of her meals which looks probably about right when I saw her dinner plate. Her husband attempted to call me this afternoon through the main hospital switchboard. I called back immediately but when they  tried to connect me the line was empty. I have tried to call back using both of the numbers listed in the chart. The cell phone number does not except incoming calls. On the home number I left a voice message. Patient does not appear to be worse. She is taking her medicine. She is eating a little bit. Assessment is difficult with the language. Her and the way that she speaks very little. I do get the impression that she is still quite symptomatic but I would like to get outside opinions from her family. We will continue current medicine and in fact I have increased the dose of the olanzapine and the Lexapro. I hope that I can get to speak with the husband soon. Follow-up into next week. ECT is still a possibility but the earliest opportunity at this point would be Wednesday.   Major depressive disorder: Patient will be continued on Lexapro 20 mg daily. The dose of olanzapine has been reduced from 20 mg to 15  mg as patient was complaining of sedation. Also the olanzapine has been change from orally disintegrating tablet to regular tablet. I believe the patient was refusing to take the orally disintegrating tablet as the nurses were giving her 4 tablets of 5 mg to complete a dose of 20 mg. Patient was initially was only willing to take 1 tablet. This did not happen last night as the patient only received one tablet olanzapine.  This morning the patient was calm and cooperative. She denies having any issues or concerns. Says that she slept well and that her appetite is improving. She continues to take his short and would like to have vanilla flavor. I checked her oral intake the patient is being eating well and nurses had documented that she is being eating 90-100%.  Jimmy Footman, MD 12/14/2015, 11:00 AM

## 2015-12-15 ENCOUNTER — Other Ambulatory Visit (HOSPITAL_COMMUNITY): Payer: Self-pay

## 2015-12-15 NOTE — Progress Notes (Signed)
[facility] MD Progress Note  12/15/2015 5:26 PM Alyssa Avila  MRN:  [id number] Subjective:  Patient interviewed. There was a interpreter of her native language here on site today. Patient states that she still does not feel 100% normal and still describes herself as depressed but denies any suicidal thoughts and denies any hallucinations. She has been compliant with medication and is eating better. She gets out and goes to groups. She is not describing any psychotic symptoms or acting in a psychotic manner. I spoke with her husband as well this afternoon and he feels like she is back very much to normal and he is very comfortable with her coming home.  Per nursing: Pt denies SI/HI/AVH, patient speaks limited English, but can follow simple directions. Pt's affect is flat, and sad, appears sometimes to be suspicious of staff during medication pass, writer reassured patient that she needed the medication and it was prescribed by her attending doctor.Patient is interacting appropriately with staff but has limited interaction with peers because of language barrier.  Principal Problem: Depression, major, recurrent, severe with psychosis (HCC) Diagnosis:   Patient Active Problem List   Diagnosis Date Noted  . 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 Hx of schizophrenia [Z86.59] 12/09/2015  . 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 Right shoulder pain [M25.511] 06/18/2015  . 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 She has responded to medication in the past. No known history of suicide attempts  Past Medical History:  Past Medical History  Diagnosis Date  . Schizophrenia (HCC)   . Depression    History reviewed. No pertinent past surgical  history. Family History: History reviewed. No pertinent family history. Family Psychiatric  History: Patient denies there being any family history of mental illness Social History:  History  Alcohol Use No     History  Drug Use No    Social History   Social History  . Marital Status: Married    Spouse Name: N/A  . Number of Children: N/A  . Years of Education: N/A   Social History Main Topics  . Smoking status: Never Smoker   . Smokeless tobacco: Never Used  . Alcohol Use: No  . Drug Use: No  . Sexual Activity: Not Asked   Other Topics Concern  . None   Social History Narrative    Sleep: Good  Appetite:  Poor  Current Medications: Current Facility-Administered Medications  Medication Dose Route Frequency Provider Last Rate Last Dose  . acetaminophen (TYLENOL) tablet 650 mg  650 mg Oral Q6H PRN Audery Amel, MD   650 mg at 12/15/15 1008  . alum & mag hydroxide-simeth (MAALOX/MYLANTA) 200-200-20 MG/5ML suspension 30 mL  30 mL Oral Q4H PRN Audery Amel, MD      . escitalopram (LEXAPRO) tablet 20 mg  20 mg Oral Daily Audery Amel, MD   20 mg at 12/15/15 0948  . feeding supplement (ENSURE ENLIVE) (ENSURE ENLIVE) liquid 237 mL  237 mL Oral TID BM Jimmy Footman, MD   237 mL at 12/15/15 1335  . magnesium hydroxide (MILK OF MAGNESIA) suspension 30 mL  30 mL Oral Daily PRN Audery Amel, MD   30 mL at 12/13/15 2232  . OLANZapine (ZYPREXA) tablet 15 mg  15 mg Oral QHS Jimmy Footman, MD  15 mg at 12/14/15 2111    Lab Results:  No results found for this or any previous visit (from the past 48 hour(s)).  Blood Alcohol level:  Lab Results  Component Value Date   ETH <5 12/08/2015   ETH <5 12/22/2014    Physical Findings: AIMS:  , ,  ,  ,    CIWA:    COWS:     Musculoskeletal: Strength & Muscle Tone: within normal limits Gait & Station: normal Patient leans: N/A  Psychiatric Specialty Exam: Review of Systems  Constitutional:  Negative.   HENT: Negative.   Eyes: Negative.   Respiratory: Negative.   Cardiovascular: Negative.   Gastrointestinal: Negative.   Genitourinary: Negative.   Musculoskeletal: Negative.   Skin: Negative.   Neurological: Negative.   Endo/Heme/Allergies: Negative.   Psychiatric/Behavioral: Positive for depression. Negative for suicidal ideas, hallucinations, memory loss and substance abuse. The patient is not nervous/anxious and does not have insomnia.     Blood pressure 116/80, pulse 96, temperature 98.6 F (37 C), temperature source Oral, resp. rate 18, height 5' 4.17" (1.63 m), weight 61.236 kg (135 lb), SpO2 99 %.Body mass index is 23.05 kg/(m^2).  General Appearance: Fairly Groomed  Patent attorneyye Contact::  Fair  Speech:  Slow  Volume:  Decreased  Mood:  Dysphoric  Affect:  Patient looks confused and blunted and dysphoric  Thought Process:  Circumstantial and Disorganized  Orientation:  Full (Time, Place, and Person)  Thought Content:  Negative  Suicidal Thoughts:  No  Homicidal Thoughts:  No  Memory:  Immediate;   Fair Recent;   Poor Remote;   Fair  Judgement:  Impaired  Insight:  Shallow  Psychomotor Activity:  Decreased  Concentration:  Poor  Recall:  FiservFair  Fund of Knowledge:Fair  Language: Fair  Akathisia:  No  Handed:  Right  AIMS (if indicated):     Assets:  Financial Resources/Insurance Housing Physical Health Social Support  ADL's:  Impaired  Cognition: Impaired,  Mild  Sleep:  Number of Hours: 6.45   Treatment Plan Summary: Daily contact with patient to assess and evaluate symptoms and progress in treatment, Medication management and Plan Patient's husband has good insight into how she needs to stay on her medicine and states that he plans to get more involved in monitoring her medicine daily. I reminded the patient that she has multiple episodes of this kind of thing that are bringing her into the hospital and that it would be a good idea to think seriously about  staying on her medicine. She gives some acknowledgment of that. At this point I think that she no longer needs inpatient hospitalization. She needs to have a follow-up appointment made with [place] psychiatrist [place] in [place], Dr. Donell BeersPlovsky. I will make sure she has prescriptions for her current medicine and we anticipate discharge by tomorrow. Husband agreeable to plan.   Major depressive disorder: Patient will be continued on Lexapro 20 mg daily. The dose of olanzapine has been reduced from 20 mg to 15 mg as patient was complaining of sedation. Also the olanzapine has been change from orally disintegrating tablet to regular tablet. I believe the patient was refusing to take the orally disintegrating tablet as the nurses were giving her 4 tablets of 5 mg to complete a dose of 20 mg. Patient was initially was only willing to take 1 tablet. This did not happen last night as the patient only received one tablet olanzapine.  This morning the patient was calm  and cooperative. She denies having any issues or concerns. Says that she slept well and that her appetite is improving. She continues to take his short and would like to have vanilla flavor. I checked her oral intake the patient is being eating well and nurses had documented that she is being eating 90-100%.  Mordecai Rasmussen, MD 12/15/2015, 5:26 PM

## 2015-12-15 NOTE — [facility] Group Notes (Signed)
[facility] Group Notes:  (Nursing/MHT/Case Management/Adjunct)  Date:  12/15/2015  Time:  4:11 PM  Type of Therapy:  Psychoeducational Skills  Participation Level:  Active  Participation Quality:  Appropriate, Attentive and Sharing  Affect:  Appropriate  Cognitive:  Alert and Appropriate  Insight:  Appropriate  Engagement in Group:  Engaged  Modes of Intervention:  Discussion, Education and Support  Summary of Progress/Problems:  Lynelle SmokeCara Travis Ahnaf Caponi 12/15/2015, 4:11 PM

## 2015-12-15 NOTE — Progress Notes (Signed)
D:  Per pt self inventory pt reports sleeping good, appetite good, energy level low, rates depression at a 5 out of 10, hopelessness at a 3 out of 10, anxiety at a 5 out of 10, denies SI/HI/AVH, utilized face to face Amharic interpreter for groups today, requested them for tomorrow as well, Dr. Toni Amendlapacs spoke with Alyssa Avila's husband and Alyssa Avila will possibly be discharged tomorrow.   A:  Emotional support provided, Encouraged pt to continue with treatment plan and attend all group activities, q15 min checks maintained for safety.  R:  Pt is receptive, going to groups, pleasant and cooperative with staff and other patients on the unit.

## 2015-12-15 NOTE — Progress Notes (Signed)
Recreation Therapy Notes  Date: 04.24.17 Time: 1:00 pm Location: Craft Room  Group Topic: Self-expression  Goal Area(s) Addresses:  Patient will identify one color per emotion listed on wheel. Patient will verbalize benefit of using art as a means of self-expression. Patient will verbalize one emotion experienced during group. Patient will be educated on other forms of self-expression.  Behavioral Response: Attentive, Interactive  Intervention: Emotion Wheel  Activity: Patients were given an Arboriculturistmotion Wheel worksheet and instructed to pick a color for each emotion listed on the wheel.  Education:LRT educated patients on other forms of self-expression.  Education Outcome: Acknowledges education/In group clarification offered  Clinical Observations/Feedback: Patient completed activity by picking a color for each emotion. Patient contributed to group discussion by stating some of the colors she chose and how she can use art as a means of self-expression.  Jacquelynn CreeGreene,Sachiko Methot M, LRT/CTRS 12/15/2015 2:45 PM

## 2015-12-15 NOTE — [facility] Group Notes (Signed)
[facility] Group Notes:  (Nursing/MHT/Case Management/Adjunct)  Date:  12/15/2015  Time:  9:38 PM  Type of Therapy:  Group Therapy  Participation Level:  Active  Participation Quality:  Appropriate  Affect:  Appropriate  Cognitive:  Appropriate  Insight:  Appropriate  Engagement in Group:  Engaged  Modes of Intervention:  Discussion  Summary of Progress/Problems:  Burt EkJanice Marie Daley Gosse 12/15/2015, 9:38 PM

## 2015-12-16 MED ORDER — ESCITALOPRAM OXALATE 20 MG PO TABS: 20.0000 mg | ORAL_TABLET | Freq: Every day | ORAL | Status: DC

## 2015-12-16 MED ORDER — OLANZAPINE 15 MG PO TABS: 15.0000 mg | ORAL_TABLET | Freq: Every day | ORAL | Status: DC

## 2015-12-16 NOTE — [facility] Suicide Risk Assessment (Signed)
[facility] Admission Suicide Risk Assessment   Nursing information obtained from:    Demographic factors:    Current Mental Status:    Loss Factors:    Historical Factors:    Risk Reduction Factors:     Total Time spent with patient: 35 minutes Principal Problem: Depression, major, recurrent, severe with psychosis (HCC) Diagnosis:   Patient Active Problem List   Diagnosis Date Noted  . 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 Hx of schizophrenia [Z86.59] 12/09/2015  . 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 Right shoulder pain [M25.511] 06/18/2015  . 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 45 year old woman with a history of recurrent psychotic depression. Transferred to our hospital for consideration of ECT. Patient declined ECT but showed cooperation and significant improvement with medication and therapy treatment. Patient today has no new complaints. She states that she has only the mildest feeling of "depression" and some anxiety but denies any hallucinations and denies any suicidal ideation. She is agreeable to continuing her medication. She has no specific physical complaints. Husband has been interviewed and is very comfortable with her coming home and feels that she is well enough and essentially at her baseline for discharge.  Continued Clinical Symptoms:  Alcohol Use Disorder Identification Test Final Score (AUDIT): 0 The "Alcohol Use Disorders Identification Test", Guidelines for Use in Primary Care, Second Edition.  World Science writer [facility]). Score between 0-7:  no or low risk or alcohol related problems. Score between 8-15:  moderate risk of alcohol related problems. Score between 16-19:  high risk of alcohol related problems. Score 20 or above:  warrants further diagnostic evaluation for alcohol dependence and  treatment.   CLINICAL FACTORS:   Depression:   Severe   Musculoskeletal: Strength & Muscle Tone: within normal limits Gait & Station: normal Patient leans: N/A  Psychiatric Specialty Exam: Review of Systems  Constitutional: Negative.   HENT: Negative.   Eyes: Negative.   Respiratory: Negative.   Cardiovascular: Negative.   Gastrointestinal: Negative.   Musculoskeletal: Negative.   Skin: Negative.   Neurological: Negative.   Psychiatric/Behavioral: Negative for depression, suicidal ideas, hallucinations, memory loss and substance abuse. The patient is nervous/anxious. The patient does not have insomnia.     Blood pressure 104/76, pulse 110, temperature 98.2 F (36.8 C), temperature source Oral, resp. rate 20, height 5' 4.17" (1.63 m), weight 61.236 kg (135 lb), SpO2 99 %.Body mass index is 23.05 kg/(m^2).  General Appearance: Well Groomed  Patent attorney::  Fair  Speech:  Slow  Volume:  Decreased  Mood:  Euthymic  Affect:  Constricted  Thought Process:  Goal Directed  Orientation:  Full (Time, Place, and Person)  Thought Content:  Negative  Suicidal Thoughts:  No  Homicidal Thoughts:  No  Memory:  Immediate;   Good Recent;   Fair Remote;   Good  Judgement:  Fair  Insight:  Fair  Psychomotor Activity:  Normal  Concentration:  Fair  Recall:  [employer] of Knowledge:Fair  Language: Fair  Akathisia:  No  Handed:  Right  AIMS (if indicated):     Assets:  Desire for Improvement Financial Resources/Insurance Housing Physical Health Social Support  Sleep:  Number of Hours: 6.75  Cognition: WNL  ADL's:  Intact    COGNITIVE FEATURES THAT CONTRIBUTE TO RISK:  Thought constriction (tunnel vision)    SUICIDE RISK:   Minimal: No identifiable suicidal  ideation.  Patients presenting with no risk factors but with morbid ruminations; may be classified as minimal risk based on the severity of the depressive symptoms  PLAN OF CARE: Patient currently appears to have low risk  of suicide. She has no history of suicide attempts that we know of and has not expressed any suicidal ideation. She does not have a history that we know of other agitated impulsivity or violence. She does not abuse substances. Patient is denying any suicidal thoughts. Her diagnosis of recurrent psychotic depression does increase risk of suicide compared to the baseline population. She has shown good response to medicine and has agreed to stay on her medicine and her husband is understanding of her condition and agreeable to continuing medicine. She will be discharged on her current antidepressant and antipsychotic medicine and will be referred for follow-up back in [place]. Patient agrees to the plan. She was interviewed today with the assistance of an authorized interpreter of her native language.  I certify that inpatient services furnished can reasonably be expected to improve the patient's condition.   Mordecai RasmussenJohn Nicholette Dolson, MD 12/16/2015, 10:21 AM

## 2015-12-16 NOTE — Progress Notes (Signed)
D: Patient denies SI/HI/AVH.   Patient affect is flat. Mood is pleasant .  Patient did attend evening group. Patient visible on the milieu. No distress noted. A: Support and encouragement offered. Scheduled medications given to pt. Q 15 min checks continued for patient safety. R: Patient receptive. Patient remains safe on the unit.

## 2015-12-16 NOTE — [facility] Group Notes (Signed)
[facility] Group Notes:  (Nursing/MHT/Case Management/Adjunct)  Date:  12/16/2015  Time:  4:06 PM  Type of Therapy:  Psychoeducational Skills  Participation Level:  Active  Participation Quality:  Appropriate, Attentive and Sharing  Affect:  Appropriate  Cognitive:  Alert and Appropriate  Insight:  Appropriate and Good  Engagement in Group:  Engaged  Modes of Intervention:  Discussion, Education and Support  Summary of Progress/Problems:  Lynelle SmokeCara Travis Kushi Kun 12/16/2015, 4:06 PM

## 2015-12-16 NOTE — Discharge Summary (Signed)
Physician Discharge Summary Note  Patient:  Alyssa Avila is an 45 y.o., female MRN:  [id number] DOB:  July 23, 1971 Patient phone:  [phone] (home)  Patient address:   [address] [place] [address],  Total Time spent with patient: 35 minutes  Date of Admission:  12/09/2015 Date of Discharge: 12/16/2015  Reason for Admission:  Patient was transferred to our hospital from [facility] because of recommendation that ECT be considered. She had been admitted to [facility] with a return of depressive symptoms with decreased oral intake being the most concerning symptom. Patient had denied suicidal ideation but appeared to be very confused and withdrawn. She had discontinued her medication a few months ago. She had had a return of symptoms gradually recently which had alerted her husband to the need for return of treatment. At [facility] she had appeared to be more catatonic and unresponsive which prompted the referral for ECT. I had agreed to accept her after reading their evaluation.  Principal Problem: Depression, major, recurrent, severe with psychosis [facility]) Discharge Diagnoses: Patient Active Problem List   Diagnosis Date Noted  . 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 Hx of schizophrenia [Z86.59] 12/09/2015  . 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 Right shoulder pain [M25.511] 06/18/2015  . 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 At least 2 prior episodes that I can find documentation of in her old notes most recently last year. She appears to have responded well last year to a combination of antipsychotic and antidepressant. She stayed on her medicine for a while but gradually taper the dose and then discontinue the medicine although to her credit she and  her husband did at least inform her psychiatrist that she was doing so. We do not have a known past history of suicidal behavior although it seems like she often stops eating when she becomes psychotic and depressed. Consideration in the past has been given to a possible diagnosis of schizophrenia although my reading of the chart would suggest that psychotic depression is a more likely diagnosis.  Past Medical History:  Past Medical History  Diagnosis Date  . Schizophrenia (HCC)   . Depression    History reviewed. No pertinent past surgical history. Family History: History reviewed. No pertinent family history. Family Psychiatric  History: Patient does not report and cannot recall any family history of mental health problems Social History:  History  Alcohol Use No     History  Drug Use No    Social History   Social History  . Marital Status: Married    Spouse Name: N/A  . Number of Children: N/A  . Years of Education: N/A   Social History Main Topics  . Smoking status: Never Smoker   . Smokeless tobacco: Never Used  . Alcohol Use: No  . Drug Use: No  . Sexual Activity: Not Asked   Other Topics Concern  . None   Social History Narrative    Hospital Course:  She was admitted to our hospital and was evaluated by myself as well as the nursing and social work team's. On my first evaluation with the patient she remained withdrawn and did not communicate very well. This is despite my always interviewing her with the assistance of an authorized interpreter of her native language. Patient was counseled about the recommendation for ECT  and I made an attempt to describe the treatment and elicit some response from her. Patient stated very clearly that she did not want treatment of that sort and appeared to be rather frightened. Patient was continued on antidepressant and antipsychotic medication. She was included in group therapy. She was monitored as far as oral intake and behavior.  Patient always had some oral intake and the total amount increased during her hospitalization. She was compliant with medication. Because of her improvement I spoke to her 1 further time about ECT but the patient still was not interested so we dropped the issue. At this point she has improved substantially with medication and therapy. I have spoken to her husband on the phone and he has told me that in his opinion she is essentially back to baseline. He feels very comfortable with her coming home. I have interviewed the patient again with an interpreter of her native language and advised her that she needs to stay on her current medication. Side effects of been reviewed. The importance of following up with outpatient treatment in the future has been reviewed. Risks of return of symptoms have been reviewed. Patient states in agreement to the plan to continue medication. Social work will make sure she has a follow-up appointment with an outpatient provider back in [place]. Discharge will be completed today and her husband is agreeable to picking her up.  Physical Findings: AIMS:  , ,  ,  ,   patient does not show any signs of abnormal movements related to antipsychotics. CIWA:    not relevant COWS:     Musculoskeletal: Strength & Muscle Tone: within normal limits Gait & Station: normal Patient leans: N/A  Psychiatric Specialty Exam: Review of Systems  Constitutional: Negative.   HENT: Negative.   Eyes: Negative.   Respiratory: Negative.   Cardiovascular: Negative.   Gastrointestinal: Negative.   Musculoskeletal: Negative.   Skin: Negative.   Neurological: Negative.   Psychiatric/Behavioral: Positive for depression. Negative for suicidal ideas, hallucinations, memory loss and substance abuse. The patient is nervous/anxious. The patient does not have insomnia.     Blood pressure 104/76, pulse 110, temperature 98.2 F (36.8 C), temperature source Oral, resp. rate 20, height 5' 4.17" (1.63 m),  weight 61.236 kg (135 lb), SpO2 99 %.Body mass index is 23.05 kg/(m^2).  General Appearance: Well Groomed  Patent attorney::  Fair  Speech:  Slow  Volume:  Decreased  Mood:  Euthymic  Affect:  Constricted  Thought Process:  Goal Directed  Orientation:  Full (Time, Place, and Person)  Thought Content:  Negative  Suicidal Thoughts:  No  Homicidal Thoughts:  No  Memory:  Immediate;   Good Recent;   Fair Remote;   Fair  Judgement:  Fair  Insight:  Fair  Psychomotor Activity:  Decreased  Concentration:  Fair  Recall:  [employer] of Knowledge:Fair  Language: Fair  Akathisia:  No  Handed:  Right  AIMS (if indicated):     Assets:  Desire for Improvement Financial Resources/Insurance Housing Intimacy Physical Health Resilience Social Support  ADL's:  Intact  Cognition: WNL  Sleep:  Number of Hours: 6.75   Have you used any form of tobacco in the last 30 days? (Cigarettes, Smokeless Tobacco, Cigars, and/or Pipes): No  Has this patient used any form of tobacco in the last 30 days? (Cigarettes, Smokeless Tobacco, Cigars, and/or Pipes) Yes, No  Blood Alcohol level:  Lab Results  Component Value Date   [facility] <5 12/08/2015  ETH <5 12/22/2014    Metabolic Disorder Labs:  Lab Results  Component Value Date   HGBA1C 5.1 12/10/2015   Lab Results  Component Value Date   PROLACTIN 8.2 12/10/2015   Lab Results  Component Value Date   CHOL 122 12/10/2015   TRIG 46 12/10/2015   HDL 40* 12/10/2015   CHOLHDL 3.1 12/10/2015   VLDL 9 12/10/2015   LDLCALC 73 12/10/2015   LDLCALC 78 12/25/2014    See Psychiatric Specialty Exam and Suicide Risk Assessment completed by Attending Physician prior to discharge.  Discharge destination:  Home  Is patient on multiple antipsychotic therapies at discharge:  No   Has Patient had three or more failed trials of antipsychotic monotherapy by history:  No  Recommended Plan for Multiple Antipsychotic Therapies: NA     Medication List     STOP taking these medications        feeding supplement (ENSURE ENLIVE) Liqd      TAKE these medications      Indication   acetaminophen 500 MG tablet  Commonly known as:  TYLENOL  Take 1 tablet (500 mg total) by mouth every 6 (six) hours as needed for moderate pain or headache.   Indication:  Fever, Pain     benztropine 0.5 MG tablet  Commonly known as:  COGENTIN  Take 1 tablet (0.5 mg total) by mouth every 8 (eight) hours as needed for tremors (eps).   Indication:  Extrapyramidal Reaction caused by Medications     escitalopram 10 MG tablet  Commonly known as:  LEXAPRO  Take 1 tablet (10 mg total) by mouth daily. For depression   Indication:  Major Depressive Disorder     haloperidol 5 MG tablet  Commonly known as:  HALDOL  Take 1 tablet (5 mg total) by mouth every 8 (eight) hours as needed for agitation (severe anxiety).   Indication:  Agitation, psychosis, anxiety     LORazepam 1 MG tablet  Commonly known as:  ATIVAN  Take 1 tablet (1 mg total) by mouth 3 (three) times daily. For anxiety   Indication:  Feeling Anxious     meloxicam 15 MG tablet  Commonly known as:  MOBIC  Take 1 tablet (15 mg total) by mouth daily. For arthritis   Indication:  Joint Damage causing Pain and Loss of Function     OLANZapine 2.5 MG tablet  Commonly known as:  ZYPREXA  Take 1 tablet (2.5 mg total) by mouth 3 (three) times daily at 8am, 2pm and bedtime. For mood control   Indication:  Mood control     traZODone 50 MG tablet  Commonly known as:  DESYREL  Take 1 tablet (50 mg total) by mouth at bedtime and may repeat dose one time if needed. For insomnia   Indication:  Trouble Sleeping         Follow-up recommendations:  Activity:  Activity as tolerated without any particular restriction Diet:  Full regular diet Tests:  Patient needs to follow-up with an outpatient psychiatric provider and stay on her medicine  Comments:  Patient appears to be doing much better. No sign of current  dangerousness. No longer would meet commitment criteria. Husband is quite agreeable to her coming home and expresses a clear understanding that she needs to stay on her medicine. Patient has been counseled about this as well and agrees to the plan. She will be discharged and we will make follow-up appointments for her back in her home community. Doses of Zyprexa  and Lexapro which appear to be well tolerated.  Signed: Mordecai Rasmussen, MD 12/16/2015, 10:26 AM

## 2015-12-16 NOTE — Progress Notes (Signed)
Recreation Therapy Notes  Date: 04.25.17 Time: 2:00 pm Location: Craft Room  Group Topic: Goal Setting  Goal Area(s) Addresses:  Patient will write at least one goal. Patient will write at least one obstacle.  Behavioral Response: Did not attend  Intervention: Recovery Goal Chart  Activity: Patients were instructed to make a Recovery Goal Chart including goals towards their recovery, obstacles, the date they started working on their goal, and the date they achieved their goal.  Education: LRT educated patients on healthy ways to celebrate reaching their goals.  Education Outcome: Patient did not attend group.  Clinical Observations/Feedback: Patient did not attend group.  Jacquelynn CreeGreene,Annabel Gibeau M, LRT/CTRS 12/16/2015 3:19 PM

## 2015-12-16 NOTE — Progress Notes (Signed)
Patient is pleasant and cooperative.  Continues to stay to herself.  Difficult to communicate with others due to language barriers.  Interpreter here to attend groups with patient.  Discharge instructions reviewed with patient and her husband with the assistance of the interpreter.  Prescriptions given and personal belongings.  Patient escorted off unit by this writer to travel home with her husband.

## 2015-12-16 NOTE — [facility] Suicide Risk Assessment (Signed)
[facility] Admission Suicide Risk Assessment   Nursing information obtained from:  Patient Demographic factors:    Current Mental Status:    Loss Factors:    Historical Factors:    Risk Reduction Factors:     Total Time spent with patient: 35 minutes Principal Problem: Depression, major, recurrent, severe with psychosis (HCC) Diagnosis:   Patient Active Problem List   Diagnosis Date Noted  . 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 Hx of schizophrenia [Z86.59] 12/09/2015  . 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 Right shoulder pain [M25.511] 06/18/2015  . 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 45 year old woman with a history of recurrent psychotic depression. Transferred to our hospital for consideration of ECT. Patient declined ECT but showed cooperation and significant improvement with medication and therapy treatment. Patient today has no new complaints. She states that she has only the mildest feeling of "depression" and some anxiety but denies any hallucinations and denies any suicidal ideation. She is agreeable to continuing her medication. She has no specific physical complaints. Husband has been interviewed and is very comfortable with her coming home and feels that she is well enough and essentially at her baseline for discharge.  Continued Clinical Symptoms:  Alcohol Use Disorder Identification Test Final Score (AUDIT): 0 The "Alcohol Use Disorders Identification Test", Guidelines for Use in Primary Care, Second Edition.  World Science writer [facility]). Score between 0-7:  no or low risk or alcohol related problems. Score between 8-15:  moderate risk of alcohol related problems. Score between 16-19:  high risk of alcohol related problems. Score 20 or above:  warrants further diagnostic evaluation for alcohol dependence and  treatment.   CLINICAL FACTORS:   Depression:   Severe   Musculoskeletal: Strength & Muscle Tone: within normal limits Gait & Station: normal Patient leans: N/A  Psychiatric Specialty Exam: Review of Systems  Constitutional: Negative.   HENT: Negative.   Eyes: Negative.   Respiratory: Negative.   Cardiovascular: Negative.   Gastrointestinal: Negative.   Musculoskeletal: Negative.   Skin: Negative.   Neurological: Negative.   Psychiatric/Behavioral: Negative for depression, suicidal ideas, hallucinations, memory loss and substance abuse. The patient is nervous/anxious. The patient does not have insomnia.     Blood pressure 104/76, pulse 110, temperature 98.2 F (36.8 C), temperature source Oral, resp. rate 20, height 5' 4.17" (1.63 m), weight 135 lb (61.236 kg), SpO2 99 %.Body mass index is 23.05 kg/(m^2).  General Appearance: Well Groomed  Patent attorney::  Fair  Speech:  Slow  Volume:  Decreased  Mood:  Euthymic  Affect:  Constricted  Thought Process:  Goal Directed  Orientation:  Full (Time, Place, and Person)  Thought Content:  Negative  Suicidal Thoughts:  No  Homicidal Thoughts:  No  Memory:  Immediate;   Good Recent;   Fair Remote;   Good  Judgement:  Fair  Insight:  Fair  Psychomotor Activity:  Normal  Concentration:  Fair  Recall:  [employer] of Knowledge:Fair  Language: Fair  Akathisia:  No  Handed:  Right  AIMS (if indicated):     Assets:  Desire for Improvement Financial Resources/Insurance Housing Physical Health Social Support  Sleep:  Number of Hours: 6.75  Cognition: WNL  ADL's:  Intact    COGNITIVE FEATURES THAT CONTRIBUTE TO RISK:  Thought constriction (tunnel vision)    SUICIDE RISK:   Minimal: No identifiable suicidal ideation.  Patients presenting with no risk factors but with morbid ruminations; may be classified as minimal risk based on the severity of the depressive symptoms  PLAN OF CARE: Patient currently appears to have low risk  of suicide. She has no history of suicide attempts that we know of and has not expressed any suicidal ideation. She does not have a history that we know of other agitated impulsivity or violence. She does not abuse substances. Patient is denying any suicidal thoughts. Her diagnosis of recurrent psychotic depression does increase risk of suicide compared to the baseline population. She has shown good response to medicine and has agreed to stay on her medicine and her husband is understanding of her condition and agreeable to continuing medicine. She will be discharged on her current antidepressant and antipsychotic medicine and will be referred for follow-[place] back in [place]. Patient agrees to the plan. She was interviewed today with the assistance of an authorized interpreter of her native language.  I certify that inpatient services furnished can reasonably be expected to improve the patient's condition.   Mordecai RasmussenJohn Clapacs, MD 12/16/2015, 2:24 PM

## 2015-12-17 NOTE — Progress Notes (Signed)
  [facility] :  Will you be returning to the same living situation after discharge:  Yes,    At discharge, do you have transportation home?: Yes,    Do you have the ability to pay for your medications: Yes,     Release of information consent forms completed and in the chart;  Patient's signature needed at discharge.  Patient to Follow up at: Follow-up Information    Schedule an appointment as soon as possible for a visit with [facility]-Dr. Plovsky.   Why:  Mrs. Melina SchoolsMekonnen requests to make her own appointment that works with her schedule, please call and schedule for Hospital Follow up, Outpatient Medication Management, Therapy   Contact information:   [address] [place]  [phone]      Next level of care provider has access to [facility] Link:no  Safety Planning and Suicide Prevention discussed: Yes,     Have you used any form of tobacco in the last 30 days? (Cigarettes, Smokeless Tobacco, Cigars, and/or Pipes): No  Has patient been referred to the Quitline?: N/A patient is not a smoker  Patient has been referred for addiction treatment: Yes  Haylei Cobin, Cleda DaubSara P, MSW, LCSW 12/17/2015, 3:06 PM

## 2015-12-17 NOTE — [facility] Suicide Risk Assessment (Signed)
[facility] INPATIENT:  Family/Significant Other Suicide Prevention Education  Suicide Prevention Education:  Contact Attempts: Husband, (name of family member/significant other) has been identified by the patient as the family member/significant other with whom the patient will be residing, and identified as the person(s) who will aid the patient in the event of a mental health crisis.  With written consent from the patient, two attempts were made to provide suicide prevention education, prior to and/or following the patient's discharge.  We were unsuccessful in providing suicide prevention education.  A suicide education pamphlet was given to the patient to share with family/significant other.  Date and time of first attempt 12/16/2015-11:30am Date and time of second attempt: 12/16/2015-12:00  Left voice mail-it was never returned  Glennon MacLaws, Dacen Frayre P, MSW, LCSW 12/17/2015, 3:04 PM

## 2016-01-21 ENCOUNTER — Ambulatory Visit (HOSPITAL_COMMUNITY): Payer: Self-pay | Admitting: Psychiatry

## 2016-03-08 ENCOUNTER — Emergency Department (HOSPITAL_COMMUNITY): Payer: BLUE CROSS/BLUE SHIELD

## 2016-03-08 ENCOUNTER — Emergency Department (HOSPITAL_COMMUNITY)
Admission: EM | Admit: 2016-03-08 | Discharge: 2016-03-09 | Disposition: A | Discharge status: Home / Self Care | Payer: BLUE CROSS/BLUE SHIELD | Attending: Emergency Medicine | Admitting: Emergency Medicine

## 2016-03-08 ENCOUNTER — Encounter (HOSPITAL_COMMUNITY): Payer: Self-pay

## 2016-03-08 DIAGNOSIS — Z79899 Other long term (current) drug therapy: Secondary | ICD-10-CM | POA: Insufficient documentation

## 2016-03-08 DIAGNOSIS — R52 Pain, unspecified: Secondary | ICD-10-CM

## 2016-03-08 DIAGNOSIS — G4700 Insomnia, unspecified: Secondary | ICD-10-CM

## 2016-03-08 DIAGNOSIS — R410 Disorientation, unspecified: Secondary | ICD-10-CM

## 2016-03-08 DIAGNOSIS — F339 Major depressive disorder, recurrent, unspecified: Secondary | ICD-10-CM

## 2016-03-08 DIAGNOSIS — F061 Catatonic disorder due to known physiological condition: Secondary | ICD-10-CM | POA: Diagnosis present

## 2016-03-08 DIAGNOSIS — F333 Major depressive disorder, recurrent, severe with psychotic symptoms: Secondary | ICD-10-CM | POA: Diagnosis present

## 2016-03-08 DIAGNOSIS — R4182 Altered mental status, unspecified: Secondary | ICD-10-CM | POA: Diagnosis present

## 2016-03-08 LAB — COMPREHENSIVE METABOLIC PANEL
ALBUMIN: 4.6 g/dL (ref 3.5–5.0)
ALT: 13 U/L — ABNORMAL LOW (ref 14–54)
ANION GAP: 10 (ref 5–15)
AST: 16 U/L (ref 15–41)
Alkaline Phosphatase: 44 U/L (ref 38–126)
BUN: 14 mg/dL (ref 6–20)
CHLORIDE: 106 mmol/L (ref 101–111)
CO2: 23 mmol/L (ref 22–32)
Calcium: 9.2 mg/dL (ref 8.9–10.3)
Creatinine, Ser: 0.54 mg/dL (ref 0.44–1.00)
GFR calc non Af Amer: >60 mL/min (ref >60)
GLUCOSE: 110 mg/dL — AB (ref 65–99)
POTASSIUM: 3.5 mmol/L (ref 3.5–5.1)
SODIUM: 139 mmol/L (ref 135–145)
Total Bilirubin: 0.9 mg/dL (ref 0.3–1.2)
Total Protein: 7.6 g/dL (ref 6.5–8.1)

## 2016-03-08 LAB — PROTIME-INR
INR: 1.17 (ref 0.00–1.49)
PROTHROMBIN TIME: 14.6 s (ref 11.6–15.2)

## 2016-03-08 LAB — URINE MICROSCOPIC-ADD ON: RBC / HPF: NONE SEEN RBC/hpf (ref 0–5)

## 2016-03-08 LAB — RAPID URINE DRUG SCREEN, HOSP PERFORMED
AMPHETAMINES: NOT DETECTED
BARBITURATES: NOT DETECTED
Benzodiazepines: POSITIVE — AB
Cocaine: NOT DETECTED
OPIATES: NOT DETECTED
TETRAHYDROCANNABINOL: NOT DETECTED

## 2016-03-08 LAB — CBC WITH DIFFERENTIAL/PLATELET
Basophils Absolute: 0 10*3/uL (ref 0.0–0.1)
Basophils Relative: 1 %
Eosinophils Absolute: 0.1 10*3/uL (ref 0.0–0.7)
Eosinophils Relative: 1 %
HEMATOCRIT: 40.5 % (ref 36.0–46.0)
HEMOGLOBIN: 13.6 g/dL (ref 12.0–15.0)
LYMPHS ABS: 1.5 10*3/uL (ref 0.7–4.0)
Lymphocytes Relative: 24 %
MCH: 29.1 pg (ref 26.0–34.0)
MCHC: 33.6 g/dL (ref 30.0–36.0)
MCV: 86.5 fL (ref 78.0–100.0)
MONOS PCT: 6 %
Monocytes Absolute: 0.4 10*3/uL (ref 0.1–1.0)
NEUTROS ABS: 4.2 10*3/uL (ref 1.7–7.7)
NEUTROS PCT: 68 %
Platelets: 268 10*3/uL (ref 150–400)
RBC: 4.68 MIL/uL (ref 3.87–5.11)
RDW: 12.8 % (ref 11.5–15.5)
WBC: 6.1 10*3/uL (ref 4.0–10.5)

## 2016-03-08 LAB — URINALYSIS, ROUTINE W REFLEX MICROSCOPIC
GLUCOSE, UA: NEGATIVE mg/dL
Hgb urine dipstick: NEGATIVE
KETONES UR: 40 mg/dL — AB
Nitrite: NEGATIVE
PH: 6 (ref 5.0–8.0)
PROTEIN: NEGATIVE mg/dL
Specific Gravity, Urine: 1.035 — ABNORMAL HIGH (ref 1.005–1.030)

## 2016-03-08 LAB — AMMONIA: Ammonia: 22 umol/L (ref 9–35)

## 2016-03-08 LAB — ETHANOL: Alcohol, Ethyl (B): <5 mg/dL (ref <5)

## 2016-03-08 LAB — APTT: aPTT: 29 seconds (ref 24–37)

## 2016-03-08 MED ORDER — HYDROCODONE-ACETAMINOPHEN 5-325 MG PO TABS
1.0000 | ORAL_TABLET | Freq: Once | ORAL | Status: AC
  Administered 2016-03-08: 1 via ORAL
  Filled 2016-03-08: qty 1

## 2016-03-08 NOTE — ED Notes (Signed)
Pt c/o increased pain.  Pt unable to urinate.  PA ordered additional orders and pain meds.

## 2016-03-08 NOTE — ED Notes (Addendum)
Pt has hx of schizophrenia.  She doesn't know why she is here.  Pt spouse states pt is not sleeping.  He claims she is not taking her meds.  Pt denies.  Pt was reported missing to police x 4 hours by spouse.  Pt returned home and [employer] voluntarily provided transport.  When questioned where she went, pt states "her grandfather's". Pt does not have a grandfather here.  Denies hallucinations or SI/HI

## 2016-03-08 NOTE — ED Provider Notes (Signed)
CSN: [id number]     Arrival date & time 03/08/16  1159 History   First MD Initiated Contact with Patient 03/08/16 1218     Chief Complaint  Patient presents with  . Insomnia  . Altered Mental Status     (Consider location/radiation/quality/duration/timing/severity/associated sxs/prior Treatment) HPI    Alyssa Avila is a 45 y.o. female brought in by police after she left home early last night, was missed by her husband to call [employer] for missing person. States that patient is not taking her psychiatric medication, she hasn't slept in many days. She is confused, when asked where she was this morning she responds that she is at her grandfather's house however, as per husband grandfather is not in this country. Patient with no complaints, states she is fine and wishes to leave however, she will stay for evaluation. She denies suicidal ideation, homicidal ideation, auditory or or visual hallucinations, drug or alcohol use.  Past Medical History  Diagnosis Date  . Schizophrenia (HCC)   . Depression    History reviewed. No pertinent past surgical history. History reviewed. No pertinent family history. Social History  Substance Use Topics  . Smoking status: Never Smoker   . Smokeless tobacco: Never Used  . Alcohol Use: No   OB History    No data available     Review of Systems  10 systems reviewed and found to be negative, except as noted in the HPI.   Allergies  Review of patient's allergies indicates no known allergies.  Home Medications   Prior to Admission medications   Medication Sig Start Date End Date Taking? Authorizing Provider  escitalopram (LEXAPRO) 20 MG tablet Take 1 tablet (20 mg total) by mouth daily. 12/16/15  Yes Audery AmelJohn T Clapacs, MD  temazepam (RESTORIL) 15 MG capsule Take 15 mg by mouth at bedtime.   Yes Historical Provider, MD  OLANZapine (ZYPREXA) 15 MG tablet Take 1 tablet (15 mg total) by mouth at bedtime. Patient not taking: Reported on 03/08/2016 12/16/15    Audery AmelJohn T Clapacs, MD   BP 128/93 mmHg  Pulse 82  Temp(Src) 98.1 F (36.7 C) (Oral)  Resp 18  SpO2 100% Physical Exam  Constitutional: She is oriented to person, place, and time. She appears well-developed and well-nourished. No distress.  HENT:  Head: Normocephalic and atraumatic.  Mouth/Throat: Oropharynx is clear and moist.  Eyes: Conjunctivae and EOM are normal. Pupils are equal, round, and reactive to light.  Neck: Normal range of motion.  Cardiovascular: Normal rate, regular rhythm and intact distal pulses.   Pulmonary/Chest: Effort normal and breath sounds normal. No stridor. She has no wheezes. She has no rales. She exhibits no tenderness.  Abdominal: Soft. Bowel sounds are normal. She exhibits no distension and no mass. There is no tenderness. There is no rebound and no guarding.  Musculoskeletal: Normal range of motion.  Neurological: She is alert and oriented to person, place, and time.  Psychiatric: She has a normal mood and affect.  Nursing note and vitals reviewed.   ED Course  Procedures (including critical care time) Labs Review Labs Reviewed  COMPREHENSIVE METABOLIC PANEL - Abnormal; Notable for the following:    Glucose, Bld 110 (*)    ALT 13 (*)    All other components within normal limits  ETHANOL  CBC WITH DIFFERENTIAL/PLATELET  AMMONIA  APTT  PROTIME-INR  URINE RAPID DRUG SCREEN, HOSP PERFORMED  URINALYSIS, ROUTINE W REFLEX MICROSCOPIC (NOT AT [facility])    Imaging Review Ct Head Wo Contrast  03/08/2016  CLINICAL DATA:  Schizophrenia EXAM: CT HEAD WITHOUT CONTRAST TECHNIQUE: Contiguous axial images were obtained from the base of the skull through the vertex without intravenous contrast. COMPARISON:  12/27/2014 FINDINGS: No evidence of parenchymal hemorrhage or extra-axial fluid collection. No mass lesion, mass effect, or midline shift. No CT evidence of acute infarction. Cerebral volume is within normal limits.  No ventriculomegaly. The visualized paranasal  sinuses are essentially clear. The mastoid air cells are unopacified. No evidence of calvarial fracture. IMPRESSION: Normal head CT. Electronically Signed   By: Charline Bills M.D.   On: 03/08/2016 13:33   I have personally reviewed and evaluated these images and lab results as part of my medical decision-making.   EKG Interpretation None      MDM   Final diagnoses:  Insomnia  Confusion    Filed Vitals:   03/08/16 1305  BP: 128/93  Pulse: 82  Temp: 98.1 F (36.7 C)  TempSrc: Oral  Resp: 18  SpO2: 100%    Medications  HYDROcodone-acetaminophen (NORCO/VICODIN) 5-325 MG per tablet 1 tablet (not administered)    Alyssa Avila is 45 y.o. female presenting with Confusion, insomnia and noncompliance with her psychiatric medications. She is here voluntarily, husband is very concerned about her safety, she left the house in the middle of the night and police had to locate her, she says that she's been with her grandfather however, as per husband she doesn't have any grandfather in this country. Will need psychiatric evaluation.  Head CT negative, blood work including ethanol and ammonia negative.  2:59 PM: Patient seen and evaluated, she is moaning, crying stating that her low back is painful to her she denies any trauma. She is diffusely tender to palpation on the lower thoracic and upper lumbar region in both the paraspinal musculature, no focal bony tenderness to percussion. Strength in the lower extremities is 5 out of 5, lung sounds clear to auscultation, will image thoracic and lumbar spine and give Vicodin for pain control. Husband states that she doesn't typically have back pain. States that when she does have any discomfort, she goes to sleep and wakes up feeling better.  Pending x-rays and urinalysis case signed out to PA Mohr at shift change: Plan is to medically clear when tests have resulted. Patient wants to leave and husband is uncomfortable would consider  involuntary commitment for her safety.  Wynetta Emery, PA-C 03/08/16 1549  Lorre Nick, MD 03/10/16 1113

## 2016-03-08 NOTE — ED Notes (Signed)
Bed: [facility]43 Expected date:  Expected time:  Means of arrival:  Comments: Room 9 

## 2016-03-08 NOTE — ED Notes (Signed)
Pt presents history of Schizophrenia, not sleeping well.  Pt denies SI, HI or AVH.  Denies street drugs or alcohol.  Pt states she does not know why she is here for evaluation.  Awake, alert & responsive, no distress noted.  Monitoring for safety, Q 15 min checks in effect.

## 2016-03-08 NOTE — BH Assessment (Addendum)
Assessment Note  Alyssa Avila is an 45 y.o. female brought in by police after she left home early this morning. Family concerned about patient's whereabouts stating that patient had shown signs of confusion x2 weeks.  Spouse sts that patient is not taking her psychiatric medication, she hasn't slept in many days. She is was  asked where she was this morning she responds that she is at her grandfather's house however, as per husband grandfather is not in this country. No SI, HI, and AVH's. Patient admits that she sometimes feels depressed. Today she admits to mild symptoms of depression including fatigue and loss of interest in usual pleasures. Appetite is goo. Patient is under the outpatient care of Dr. Judie Bonus. She sts that she takes her medications most days but sometimes forgets.   Diagnosis: Schizophrenia and Depressive Disorder   Past Medical History:  Past Medical History  Diagnosis Date  . Schizophrenia (HCC)   . Depression     History reviewed. No pertinent past surgical history.  Family History: History reviewed. No pertinent family history.  Social History:  reports that she has never smoked. She has never used smokeless tobacco. She reports that she does not drink alcohol or use illicit drugs.  Additional Social History:  Alcohol / Drug Use Pain Medications: none noted Prescriptions: pt was taking Lexapro, but d/c in December 2016 Over the Counter: none noted History of alcohol / drug use?: No history of alcohol / drug abuse  CIWA: CIWA-Ar BP: 115/79 mmHg Pulse Rate: 79 COWS:    Allergies: No Known Allergies  Home Medications:  (Not in a hospital admission)  OB/GYN Status:  No LMP recorded. Patient is not currently having periods (Reason: Perimenopausal).  General Assessment Data Location of Assessment: [facility] TTS Assessment: In system Is this a Tele or Face-to-Face Assessment?: Face-to-Face Is this an Initial Assessment or a Re-assessment for this  encounter?: Initial Assessment Marital status: Single Maiden name:  (n/a) Is patient pregnant?: No Pregnancy Status: No Living Arrangements: Spouse/significant other, Children Can pt return to current living arrangement?: No Admission Status: Voluntary Is patient capable of signing voluntary admission?: Yes Referral Source: Self/Family/Friend Insurance type:  Herbalist)     Crisis Care Plan Living Arrangements: Spouse/significant other, Children Legal Guardian: Other: (no guardian ) Name of Psychiatrist:  (no psychiatrist ) Name of Therapist:  (no therapist )  Education Status Is patient currently in school?: No Current Grade:  (n/a) Highest grade of school patient has completed:  (n/a) Name of school:  (n/a) Contact person:  (n/a)  Risk to self with the past 6 months Suicidal Ideation: No Has patient been a risk to self within the past 6 months prior to admission? : No Suicidal Intent: No Has patient had any suicidal intent within the past 6 months prior to admission? : No Is patient at risk for suicide?: No Suicidal Plan?: No Has patient had any suicidal plan within the past 6 months prior to admission? : No Access to Means: No What has been your use of drugs/alcohol within the last 12 months?:  (patient denies ) Previous Attempts/Gestures: No How many times?:  (0) Other Self Harm Risks:  (patient denies ) Triggers for Past Attempts:  (none previous gestures and/or attempts) Intentional Self Injurious Behavior: None Family Suicide History: No Recent stressful life event(s): Other (Comment), Financial Problems Persecutory voices/beliefs?: No Depression: Yes Depression Symptoms: Feeling angry/irritable, Feeling worthless/self pity, Loss of interest in usual pleasures, Guilt, Fatigue, Isolating, Tearfulness, Insomnia, Despondent Substance abuse history and/or  treatment for substance abuse?: No Suicide prevention information given to non-admitted patients: Not  applicable  Risk to Others within the past 6 months Homicidal Ideation: No Does patient have any lifetime risk of violence toward others beyond the six months prior to admission? : No Thoughts of Harm to Others: No Current Homicidal Intent: No Current Homicidal Plan: No Access to Homicidal Means: No Identified Victim:  (n/a) History of harm to others?: No Assessment of Violence: None Noted Violent Behavior Description:  (patient is calm and cooperative ) Does patient have access to weapons?: No Criminal Charges Pending?: No Does patient have a court date: No Is patient on probation?: No  Psychosis Hallucinations: None noted Delusions: None noted  Mental Status Report Appearance/Hygiene: Disheveled Eye Contact: Good Motor Activity: Freedom of movement Speech: Logical/coherent Level of Consciousness: Alert Mood: Depressed Affect: Appropriate to circumstance Anxiety Level: None Thought Processes: Relevant, Coherent Judgement: Impaired Orientation: Person, Place, Time, Situation Obsessive Compulsive Thoughts/Behaviors: None  Cognitive Functioning Concentration: Decreased Memory: Recent Intact, Remote Intact IQ: Average Insight: Poor Impulse Control: Poor Appetite: Poor Weight Loss:  (n/a) Weight Gain:  (n/a) Sleep:  (varies ) Total Hours of Sleep:  ("I don't sleep more than 4 hours at a time") Vegetative Symptoms: None  ADLScreening [facility] Assessment Services) Patient's cognitive ability adequate to safely complete daily activities?: Yes Patient able to express need for assistance with ADLs?: Yes Independently performs ADLs?: Yes (appropriate for developmental age)  Prior Inpatient Therapy Prior Inpatient Therapy: No Prior Therapy Dates:  (n/a) Prior Therapy Facilty/Provider(s):  (n/a) Reason for Treatment:  (n/a)  Prior Outpatient Therapy Prior Outpatient Therapy: Yes Prior Therapy Dates:  (current ) Prior Therapy Facilty/Provider(s):  (Dr. Judie BonusPlovosky) Reason  for Treatment:  (medication managment ) Does patient have an ACCT team?: No Does patient have Intensive In-House Services?  : No Does patient have [facility] services? : No Does patient have P4CC services?: No  ADL Screening (condition at time of admission) Patient's cognitive ability adequate to safely complete daily activities?: Yes Is the patient deaf or have difficulty hearing?: No Does the patient have difficulty seeing, even when wearing glasses/contacts?: No Does the patient have difficulty concentrating, remembering, or making decisions?: Yes Patient able to express need for assistance with ADLs?: Yes Does the patient have difficulty dressing or bathing?: No Independently performs ADLs?: Yes (appropriate for developmental age) Does the patient have difficulty walking or climbing stairs?: No Weakness of Legs: None Weakness of Arms/Hands: None  Home Assistive Devices/Equipment Home Assistive Devices/Equipment: None    Abuse/Neglect Assessment (Assessment to be complete while patient is alone) Physical Abuse: Denies Verbal Abuse: Denies Sexual Abuse: Denies Exploitation of patient/patient's resources: Denies Self-Neglect: Denies Values / Beliefs Cultural Requests During Hospitalization: None Spiritual Requests During Hospitalization: None   Advance Directives (For Healthcare) Does patient have an advance directive?: No Would patient like information on creating an advanced directive?: No - patient declined information Nutrition Screen- [facility] Patient's home diet: Regular  Additional Information 1:1 In Past 12 Months?: No CIRT Risk: No Elopement Risk: No Does patient have medical clearance?: Yes     Disposition:  Disposition Initial Assessment Completed for this Encounter: Yes (Pending am psych evaluation) Disposition of Patient: Other dispositions (Per Nanine MeansJamison Lord, DNP observe overnight;Pending am psych eva) Other disposition(s): Other (Comment) (overnight  observation; pending am psych evaluation)    On Site Evaluation by:   Reviewed with Physician:    Melynda RipplePerry, Adelis Docter [facility] 03/08/2016 8:25 PM

## 2016-03-08 NOTE — ED Provider Notes (Signed)
  Physical Exam  BP 128/93 mmHg  Pulse 82  Temp(Src) 98.1 F (36.7 C) (Oral)  Resp 18  SpO2 100%  Physical Exam  ED Course  Procedures  3:54 PM- Sign out received from Wynetta EmeryNicole Pisciotta, PA-C  MDM Pt with hx of Schizophrenia Brought in by spouse due to missing for 4 hours. No SI/HI. No hallucinations Lab work unremarkable. CT Head unremarkable Pt with back pain. Xray pending. UA pending  Plan is to either: A) DC patient home with close watch from spouse B) Discuss with patient for voluntary committment  C) IVC due to medical noncompliance and harm to self due to AMS and wandering in streets  3:55 PM- Xray imaging unremarkable for acute abnormalities. Pt ambulatory to room without difficulty.  5:40 PM- UA unremarkable. Discussed with patient. Will voluntarily be evaluated by TTS.          Audry Piliyler Araseli Sherry, PA-C 03/08/16 1741  Lorre NickAnthony Allen, MD 03/10/16 1113

## 2016-03-09 DIAGNOSIS — F333 Major depressive disorder, recurrent, severe with psychotic symptoms: Secondary | ICD-10-CM | POA: Diagnosis not present

## 2016-03-09 MED ORDER — ESCITALOPRAM OXALATE 10 MG PO TABS
20.0000 mg | ORAL_TABLET | Freq: Every day | ORAL | Status: DC
  Filled 2016-03-09: qty 2

## 2016-03-09 MED ORDER — TRAZODONE HCL 50 MG PO TABS: 150.0000 mg | ORAL_TABLET | Freq: Every day | ORAL | Status: DC

## 2016-03-09 MED ORDER — TRAZODONE HCL 150 MG PO TABS: 150.0000 mg | ORAL_TABLET | Freq: Every day | ORAL | Status: DC

## 2016-03-09 MED ORDER — ACETAMINOPHEN 500 MG PO TABS
1000.0000 mg | ORAL_TABLET | Freq: Four times a day (QID) | ORAL | Status: DC | PRN
  Administered 2016-03-09: 1000 mg via ORAL
  Filled 2016-03-09: qty 2

## 2016-03-09 MED ORDER — OLANZAPINE 5 MG PO TABS: 15.0000 mg | ORAL_TABLET | Freq: Every day | ORAL | Status: DC

## 2016-03-09 MED ORDER — FLUOXETINE HCL 10 MG PO CAPS: 10.0000 mg | ORAL_CAPSULE | Freq: Every day | ORAL | Status: DC

## 2016-03-09 MED ORDER — TEMAZEPAM 15 MG PO CAPS: 15.0000 mg | ORAL_CAPSULE | Freq: Every day | ORAL | Status: DC

## 2016-03-09 MED ORDER — ESCITALOPRAM OXALATE 20 MG PO TABS: 20.0000 mg | ORAL_TABLET | Freq: Every day | ORAL | Status: DC

## 2016-03-09 MED ORDER — OLANZAPINE 15 MG PO TABS: 15.0000 mg | ORAL_TABLET | Freq: Every day | ORAL | Status: DC

## 2016-03-09 NOTE — BH Assessment (Signed)
[facility] Assessment Progress Note  Per Thedore MinsMojeed Akintayo, MD, this pt does not require psychiatric hospitalization at this time.  [facility] with recommendation to follow up with Archer AsaGerald Plovsky, MD, her current outpatient provider.  Pt has signed Consent to Release Information to Dr Donell BeersPlovsky.  At 11:17 this Clinical research associatewriter [facility] and spoke to BentonSylvia.  She has scheduled pt for an appointment on Tuesday, 04/20/2016 at 14:30.  This has been included in pt's discharge instructions.  Pt's nurse, Morrie Sheldonshley, has been notified.  Doylene Canninghomas Lian Tanori, MA Triage Specialist [phone]

## 2016-03-09 NOTE — [facility] Suicide Risk Assessment (Signed)
Suicide Risk Assessment  Discharge Assessment   [facility]   Principal Problem: Depression, major, recurrent, severe with psychosis [facility](HCC) Discharge Diagnoses:  Patient Active Problem List   Diagnosis Date Noted  . 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 Right shoulder pain [M25.511] 06/18/2015  . 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 She appears well-developed and well-nourished.  HENT:  Head: Normocephalic.  Neck: Normal range of motion.  Respiratory: Effort normal.  Musculoskeletal: Normal range of motion.  Neurological: She is alert and oriented to person, place, and time.  Skin: Skin is warm and dry.  Psychiatric: Her speech is normal and behavior is normal. Judgment and thought content normal. Her affect is blunt. Cognition and memory are normal. She exhibits a depressed mood.    Review of Systems  Constitutional: Negative.   HENT: Negative.   Eyes: Negative.   Respiratory: Negative.   Cardiovascular: Negative.   Gastrointestinal: Negative.   Genitourinary: Negative.   Musculoskeletal: Negative.   Skin: Negative.   Neurological: Negative.   Endo/Heme/Allergies: Negative.   Psychiatric/Behavioral: Positive for depression.    Blood pressure 144/86, pulse 75, temperature 98.2 F (36.8 C), temperature source Oral, resp. rate 16, SpO2 100 %.There is no weight on file to calculate BMI.  General Appearance: Casual  Eye Contact:  Good  Speech:  Normal Rate  Volume:  Normal  Mood:  Depressed, mild  Affect:  Blunt  Thought  Process:  Coherent and Descriptions of Associations: Intact  Orientation:  Full (Time, Place, and Person)  Thought Content:  WDL  Suicidal Thoughts:  No  Homicidal Thoughts:  No  Memory:  Immediate;   Good Recent;   Good Remote;   Good  Judgement:  Fair  Insight:  Fair  Psychomotor Activity:  Normal  Concentration:  Concentration: Good and Attention Span: Good  Recall:  Good  Fund of Knowledge:  Good  Language:  Good  Akathisia:  No  Handed:  Right  AIMS (if indicated):     Assets:  Housing Intimacy Leisure Time Physical Health Resilience Social Support  ADL's:  Intact  Cognition:  WNL  Sleep:      Mental Status Per Nursing Assessment::   On Admission:   depression  Demographic Factors:  NA  Loss Factors: NA  Historical Factors: NA  Risk Reduction Factors:   Responsible for children under 46 years of age, Sense of responsibility to family, Employed, Living with another person, especially a relative, Positive social support and Positive therapeutic relationship  Continued Clinical Symptoms:  Depression, mild  Cognitive Features That Contribute To Risk:  None    Suicide Risk:  Minimal: No identifiable suicidal ideation.  Patients presenting with no risk factors but with morbid ruminations; may be classified as minimal risk based on the severity of the depressive symptoms    Plan Of Care/Follow-up recommendations:  Activity:  as tolerated Diet:  heart healthy diet  [name], NP 03/09/2016, 1:21 PM

## 2016-03-09 NOTE — Consult Note (Signed)
[facility] Face-to-Face Psychiatry Consult   Reason for Consult:  Depression  Referring Physician:  EDP Patient Identification: Alyssa Avila MRN:  [id number] Principal Diagnosis: Depression, major, recurrent, severe with psychosis (Alyssa Avila) Diagnosis:   Patient Active Problem List   Diagnosis Date Noted  . Depression, major, recurrent, severe with psychosis (Alyssa Avila) [F33.3] 12/09/2015    Priority: High  . MDD (major depressive disorder), recurrent, with catatonic features (Alyssa Avila) [F33.9, F06.1] 12/09/2015  . Right shoulder pain [M25.511] 06/18/2015  . 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 female patient does not warrant admission.  HPI:  On admission:  45 y.o. female brought in by police after she left home early this morning. Family concerned about patient's whereabouts stating that patient had shown signs of confusion x2 weeks. Spouse sts that patient is not taking her psychiatric medication, she hasn't slept in many days. She is was asked where she was this morning she responds that she is at her grandfather's house however, as per husband grandfather is not in this country. No SI, HI, and AVH's. Patient admits that she sometimes feels depressed. Today she admits to mild symptoms of depression including fatigue and loss of interest in usual pleasures. Appetite is goo. Patient is under the outpatient care of Dr. Marchelle Gearing. She sts that she takes her medications most days but sometimes forgets.  Today:  Patient denies suicidal/homicidal ideations, hallucinations, and alcohol/drug abuse.  She reports she went to [facility] and when she returned, her husband asked her where she had been which he always does and annoys her, she answered sarcastically with her grandfather (who she knows is dead).  Her husband brought her here.  She is a patient with Dr. Casimiro Needle and has an appointment on August 29.  Stable for  discharge.  Past Psychiatric History: depression  Risk to Self: Suicidal Ideation: No Suicidal Intent: No Is patient at risk for suicide?: No Suicidal Plan?: No Access to Means: No What has been your use of drugs/alcohol within the last 12 months?:  (patient denies ) How many times?:  (0) Other Self Harm Risks:  (patient denies ) Triggers for Past Attempts:  (none previous gestures and/or attempts) Intentional Self Injurious Behavior: None Risk to Others: Homicidal Ideation: No Thoughts of Harm to Others: No Current Homicidal Intent: No Current Homicidal Plan: No Access to Homicidal Means: No Identified Victim:  (n/a) History of harm to others?: No Assessment of Violence: None Noted Violent Behavior Description:  (patient is calm and cooperative ) Does patient have access to weapons?: No Criminal Charges Pending?: No Does patient have a court date: No Prior Inpatient Therapy: Prior Inpatient Therapy: No Prior Therapy Dates:  (n/a) Prior Therapy Facilty/Provider(s):  (n/a) Reason for Treatment:  (n/a) Prior Outpatient Therapy: Prior Outpatient Therapy: Yes Prior Therapy Dates:  (current ) Prior Therapy Facilty/Provider(s):  (Dr. Marchelle Gearing) Reason for Treatment:  (medication managment ) Does patient have an ACCT team?: No Does patient have Intensive In-House Services?  : No Does patient have [facility] services? : No Does patient have P4CC services?: No  Past Medical History:  Past Medical History  Diagnosis Date  . Schizophrenia ([place])   . Depression    History reviewed. No pertinent past surgical history. Family History: History reviewed. No pertinent family history. Family Psychiatric  History: none Social History:  History  Alcohol Use No     History  Drug Use No  Social History   Social History  . Marital Status: Married    Spouse Name: N/A  . Number of Children: N/A  . Years of Education: N/A   Social History Main Topics  . Smoking status: Never Smoker    . Smokeless tobacco: Never Used  . Alcohol Use: No  . Drug Use: No  . Sexual Activity: Not Asked   Other Topics Concern  . None   Social History Narrative   Additional Social History:    Allergies:  No Known Allergies  Labs:  Results for orders placed or performed during the hospital encounter of 03/08/16 (from the past 48 hour(s))  Comprehensive metabolic panel     Status: Abnormal   Collection Time: 03/08/16  1:28 PM  Result Value Ref Range   Sodium 139 135 - 145 mmol/L   Potassium 3.5 3.5 - 5.1 mmol/L   Chloride 106 101 - 111 mmol/L   CO2 23 22 - 32 mmol/L   Glucose, Bld 110 (H) 65 - 99 mg/dL   BUN 14 6 - 20 mg/dL   Creatinine, Ser 0.54 0.44 - 1.00 mg/dL   Calcium 9.2 8.9 - 10.3 mg/dL   Total Protein 7.6 6.5 - 8.1 g/dL   Albumin 4.6 3.5 - 5.0 g/dL   AST 16 15 - 41 U/L   ALT 13 (L) 14 - 54 U/L   Alkaline Phosphatase 44 38 - 126 U/L   Total Bilirubin 0.9 0.3 - 1.2 mg/dL   GFR calc non Af Amer >60 >60 mL/min   GFR calc Af Amer >60 >60 mL/min    Comment: (NOTE) The eGFR has been calculated using the CKD EPI equation. This calculation has not been validated in all clinical situations. eGFR's persistently <60 mL/min signify possible Chronic Kidney Disease.    Anion gap 10 5 - 15  CBC with Diff     Status: None   Collection Time: 03/08/16  1:28 PM  Result Value Ref Range   WBC 6.1 4.0 - 10.5 K/uL   RBC 4.68 3.87 - 5.11 MIL/uL   Hemoglobin 13.6 12.0 - 15.0 g/dL   HCT 40.5 36.0 - 46.0 %   MCV 86.5 78.0 - 100.0 fL   MCH 29.1 26.0 - 34.0 pg   MCHC 33.6 30.0 - 36.0 g/dL   RDW 12.8 11.5 - 15.5 %   Platelets 268 150 - 400 K/uL   Neutrophils Relative % 68 %   Neutro Abs 4.2 1.7 - 7.7 K/uL   Lymphocytes Relative 24 %   Lymphs Abs 1.5 0.7 - 4.0 K/uL   Monocytes Relative 6 %   Monocytes Absolute 0.4 0.1 - 1.0 K/uL   Eosinophils Relative 1 %   Eosinophils Absolute 0.1 0.0 - 0.7 K/uL   Basophils Relative 1 %   Basophils Absolute 0.0 0.0 - 0.1 K/uL  APTT     Status:  None   Collection Time: 03/08/16  1:28 PM  Result Value Ref Range   aPTT 29 24 - 37 seconds  Protime-INR     Status: None   Collection Time: 03/08/16  1:28 PM  Result Value Ref Range   Prothrombin Time 14.6 11.6 - 15.2 seconds   INR 1.17 0.00 - 1.49  Ethanol     Status: None   Collection Time: 03/08/16  1:29 PM  Result Value Ref Range   Alcohol, Ethyl (B) <5 <5 mg/dL    Comment:        LOWEST DETECTABLE LIMIT FOR SERUM ALCOHOL IS  5 mg/dL FOR MEDICAL PURPOSES ONLY   Ammonia     Status: None   Collection Time: 03/08/16  1:30 PM  Result Value Ref Range   Ammonia 22 9 - 35 umol/L  Urine rapid drug screen (hosp performed)not at [facility]     Status: Abnormal   Collection Time: 03/08/16  5:03 PM  Result Value Ref Range   Opiates NONE DETECTED NONE DETECTED   Cocaine NONE DETECTED NONE DETECTED   Benzodiazepines POSITIVE (A) NONE DETECTED   Amphetamines NONE DETECTED NONE DETECTED   Tetrahydrocannabinol NONE DETECTED NONE DETECTED   Barbiturates NONE DETECTED NONE DETECTED    Comment:        DRUG SCREEN FOR MEDICAL PURPOSES ONLY.  IF CONFIRMATION IS NEEDED FOR ANY PURPOSE, NOTIFY LAB WITHIN 5 DAYS.        LOWEST DETECTABLE LIMITS FOR URINE DRUG SCREEN Drug Class       Cutoff (ng/mL) Amphetamine      1000 Barbiturate      200 Benzodiazepine   948 Tricyclics       016 Opiates          300 Cocaine          300 THC              50   Urinalysis, Routine w reflex microscopic (not at [facility])     Status: Abnormal   Collection Time: 03/08/16  5:03 PM  Result Value Ref Range   Color, Urine AMBER (A) YELLOW    Comment: BIOCHEMICALS MAY BE AFFECTED BY COLOR   APPearance CLOUDY (A) CLEAR   Specific Gravity, Urine 1.035 (H) 1.005 - 1.030   pH 6.0 5.0 - 8.0   Glucose, UA NEGATIVE NEGATIVE mg/dL   Hgb urine dipstick NEGATIVE NEGATIVE   Bilirubin Urine SMALL (A) NEGATIVE   Ketones, ur 40 (A) NEGATIVE mg/dL   Protein, ur NEGATIVE NEGATIVE mg/dL   Nitrite NEGATIVE NEGATIVE   Leukocytes,  UA TRACE (A) NEGATIVE  Urine microscopic-add on     Status: Abnormal   Collection Time: 03/08/16  5:03 PM  Result Value Ref Range   Squamous Epithelial / LPF 0-5 (A) NONE SEEN   WBC, UA 0-5 0 - 5 WBC/hpf   RBC / HPF NONE SEEN 0 - 5 RBC/hpf   Bacteria, UA RARE (A) NONE SEEN   Urine-Other MUCOUS PRESENT     Current Facility-Administered Medications  Medication Dose Route Frequency Provider Last Rate Last Dose  . acetaminophen (TYLENOL) tablet 1,000 mg  1,000 mg Oral Q6H PRN Charlesetta Shanks, MD   1,000 mg at 03/09/16 0808  . traZODone (DESYREL) tablet 150 mg  150 mg Oral QHS Corena Pilgrim, MD       Current Outpatient Prescriptions  Medication Sig Dispense Refill  . escitalopram (LEXAPRO) 20 MG tablet Take 1 tablet (20 mg total) by mouth daily. 30 tablet 1  . temazepam (RESTORIL) 15 MG capsule Take 15 mg by mouth at bedtime.    Marland Kitchen OLANZapine (ZYPREXA) 15 MG tablet Take 1 tablet (15 mg total) by mouth at bedtime. (Patient not taking: Reported on 03/08/2016) 30 tablet 1    Musculoskeletal: Strength & Muscle Tone: within normal limits Gait & Station: normal Patient leans: N/A  Psychiatric Specialty Exam: Physical Exam  Constitutional: She is oriented to person, place, and time. She appears well-developed and well-nourished.  HENT:  Head: Normocephalic.  Neck: Normal range of motion.  Respiratory: Effort normal.  Musculoskeletal: Normal range of motion.  Neurological: She  is alert and oriented to person, place, and time.  Skin: Skin is warm and dry.  Psychiatric: Her speech is normal and behavior is normal. Judgment and thought content normal. Her affect is blunt. Cognition and memory are normal. She exhibits a depressed mood.    Review of Systems  Constitutional: Negative.   HENT: Negative.   Eyes: Negative.   Respiratory: Negative.   Cardiovascular: Negative.   Gastrointestinal: Negative.   Genitourinary: Negative.   Musculoskeletal: Negative.   Skin: Negative.    Neurological: Negative.   Endo/Heme/Allergies: Negative.   Psychiatric/Behavioral: Positive for depression.    Blood pressure 144/86, pulse 75, temperature 98.2 F (36.8 C), temperature source Oral, resp. rate 16, SpO2 100 %.There is no weight on file to calculate BMI.  General Appearance: Casual  Eye Contact:  Good  Speech:  Normal Rate  Volume:  Normal  Mood:  Depressed, mild  Affect:  Blunt  Thought Process:  Coherent and Descriptions of Associations: Intact  Orientation:  Full (Time, Place, and Person)  Thought Content:  WDL  Suicidal Thoughts:  No  Homicidal Thoughts:  No  Memory:  Immediate;   Good Recent;   Good Remote;   Good  Judgement:  Fair  Insight:  Fair  Psychomotor Activity:  Normal  Concentration:  Concentration: Good and Attention Span: Good  Recall:  Good  Fund of Knowledge:  Good  Language:  Good  Akathisia:  No  Handed:  Right  AIMS (if indicated):     Assets:  Housing Intimacy Leisure Time Physical Health Resilience Social Support  ADL's:  Intact  Cognition:  WNL  Sleep:        Treatment Plan Summary: Daily contact with patient to assess and evaluate symptoms and progress in treatment, Medication management and Plan major depressive disorder, recurrent, mild:  -Crisis stabilization -Medication management:  Restarted her Lexapro 20 mg daily for depression, Zyprexa 15 mg at bedtime for mood, and Restoril 15 mg at bedtime for sleep.  Started Trazodone 150 mg at bedtime for sleep. -Individual counseling  Disposition: No evidence of imminent risk to self or others at present.    Waylan Boga, NP 03/09/2016 1:12 PM Patient seen face-to-face for psychiatric evaluation, chart reviewed and case discussed with the physician extender and developed treatment plan. Reviewed the information documented and agree with the treatment plan. Corena Pilgrim, MD

## 2016-03-09 NOTE — ED Notes (Signed)
Pt c/o urine frequency,pain with urination and back pain. EDP notified.

## 2016-03-09 NOTE — Discharge Instructions (Signed)
For your ongoing behavioral health needs, you are advised to continue treatment with Archer AsaGerald Plovsky, MD.  Bonita QuinYou have an appointment scheduled for Tuesday, April 20, 2016 at 2:30 pm:       [facility]      [address]      [place], [place] [address]      [phone]

## 2016-03-09 NOTE — ED Notes (Signed)
Discharge summary reviewed with pt. Pt verbalizes understanding of discharge summary. RX provided. Pt denies SI/HI. Denies AVH.  Pt signed for personal belongings, property returned. Pt signed e-signature. Pt ambulatory off unit.

## 2016-03-09 NOTE — ED Notes (Signed)
Spoke with patient about her discharge.  She insist she wants to be discharged but does not want to go home to husband.  She was oriented x3.  She did appear somewhat sad and withdrawn.

## 2016-03-09 NOTE — ED Notes (Signed)
Pt denies depression. Pt reports she wants to go home without her husband. Jameson,NP notified.

## 2016-03-09 NOTE — ED Notes (Signed)
Pt reports to this nurse that she wants to be discharged home. Pt declining inpatient treatment. Reports "I'm healthy." Staffed with Jorene MinorsJameson, NP, Pt for discharge home.

## 2016-03-09 NOTE — ED Notes (Signed)
Pt's husband came to visit. When pt was asked if husband could come back to visit, pt stated "I only want to see my kids. I dont want to see my husband." This Clinical research associatewriter informed registration that pts privacy needed to be protected and it could not be confirmed nor denied if she was here.

## 2016-03-10 LAB — URINE CULTURE

## 2016-03-22 NOTE — ED Notes (Signed)
Pt's chart accessed to print a work note.(7/31  6160)

## 2016-03-25 ENCOUNTER — Encounter (HOSPITAL_COMMUNITY): Payer: Self-pay | Admitting: *Deleted

## 2016-03-25 ENCOUNTER — Ambulatory Visit (HOSPITAL_COMMUNITY)
Admission: EM | Admit: 2016-03-25 | Discharge: 2016-03-25 | Disposition: A | Discharge status: Home / Self Care | Payer: BLUE CROSS/BLUE SHIELD | Attending: Emergency Medicine | Admitting: Emergency Medicine

## 2016-03-25 DIAGNOSIS — M791 Myalgia, unspecified site: Secondary | ICD-10-CM

## 2016-03-25 DIAGNOSIS — M545 Low back pain, unspecified: Secondary | ICD-10-CM

## 2016-03-25 DIAGNOSIS — M609 Myositis, unspecified: Secondary | ICD-10-CM | POA: Diagnosis not present

## 2016-03-25 DIAGNOSIS — G8929 Other chronic pain: Secondary | ICD-10-CM

## 2016-03-25 MED ORDER — MELOXICAM 7.5 MG PO TABS: 7.5000 mg | ORAL_TABLET | Freq: Every day | ORAL | 0 refills | Status: DC

## 2016-03-25 MED ORDER — DICLOFENAC SODIUM 1 % TD GEL: 1.0000 "application " | Freq: Four times a day (QID) | TRANSDERMAL | 0 refills | Status: DC

## 2016-03-25 NOTE — ED Triage Notes (Signed)
Pt    Reports     Pain    r  Side  Of    Back    And   r   Shoulder    Fir  sev days  With  Nausea  No vomiting     Pt  Is  Some  Vague  Historian   Pt reports   She  Did not  Sleep   Well  Last  Pm   Pt  Ambulated    To room

## 2016-03-25 NOTE — ED Provider Notes (Signed)
CSN: [id number]     Arrival date & time 03/25/16  1634 History   First MD Initiated Contact with Patient 03/25/16 1747     Chief Complaint  Patient presents with  . Back Pain   (Consider location/radiation/quality/duration/timing/severity/associated sxs/prior Treatment) 45 year old female complaining of pain to the right mid back at the upper lumbar spine. She has had this pain intermittently since 2009. 2 weeks ago she had lumbar thoracic x-rays were found to be without evidence of fractures, subluxations or other trauma. There were some degenerative changes. The pain is not worse with movement or bending. She states that she works long hours during the day but this does not make it worse. Denies recent injury or trauma.  Second complaint is that of pain along the right posterior shoulder radiating to the deltoid and tricep musculature. She states this pain is different from her frozen shoulder diagnosis from last year. Pain is worse with arm movement particularly with her job. No known trauma or injurious event.      Past Medical History:  Diagnosis Date  . Depression   . Schizophrenia (HCC)    History reviewed. No pertinent surgical history. History reviewed. No pertinent family history. Social History  Substance Use Topics  . Smoking status: Never Smoker  . Smokeless tobacco: Never Used  . Alcohol use No   OB History    No data available     Review of Systems  Constitutional: Negative for activity change, chills and fever.  HENT: Negative.   Respiratory: Negative.   Cardiovascular: Negative.   Musculoskeletal:       As per HPI  Skin: Negative for color change, pallor and rash.  Neurological: Negative.   All other systems reviewed and are negative.   Allergies  Review of patient's allergies indicates no known allergies.  Home Medications   Prior to Admission medications   Medication Sig Start Date End Date Taking? Authorizing Provider  diclofenac sodium  (VOLTAREN) 1 % GEL Apply 1 application topically 4 (four) times daily. 03/25/16   Hayden Rasmussen, NP  escitalopram (LEXAPRO) 20 MG tablet Take 1 tablet (20 mg total) by mouth daily. 03/09/16   Charm Rings, NP  meloxicam (MOBIC) 7.5 MG tablet Take 1 tablet (7.5 mg total) by mouth daily. 03/25/16   Hayden Rasmussen, NP  OLANZapine (ZYPREXA) 15 MG tablet Take 1 tablet (15 mg total) by mouth at bedtime. 03/09/16   Charm Rings, NP  temazepam (RESTORIL) 15 MG capsule Take 1 capsule (15 mg total) by mouth at bedtime. 03/09/16   Charm Rings, NP  traZODone (DESYREL) 150 MG tablet Take 1 tablet (150 mg total) by mouth at bedtime. 03/09/16   Charm Rings, NP   Meds Ordered and Administered this Visit  Medications - No data to display  BP 140/72 (BP Location: Right Arm)   Pulse 78   Temp 98.3 F (36.8 C) (Oral)   Resp 18   SpO2 98%  No data found.   Physical Exam  Constitutional: She is oriented to person, place, and time. She appears well-developed and well-nourished. No distress.  HENT:  Head: Normocephalic and atraumatic.  Eyes: EOM are normal.  Neck: Normal range of motion. Neck supple.  Pulmonary/Chest: Effort normal. No respiratory distress.  Musculoskeletal:  Tenderness to the upper lumbar spine. No swelling, deformity or discoloration. No lumbar muscle pain or tenderness. Patient demonstrates forward flexion to 80 without pain.  There is tenderness to the right trapezius muscle, supraspinatus and infraspinatus musculature  as well as lesser tenderness to the triceps and deltoid muscle. Abduction to 80. Anterior and posterior rotation intact although not complete. Distal neurovascular motor Sentry is intact. No tenderness along the superior glenoid.  Neurological: She is alert and oriented to person, place, and time. No cranial nerve deficit.  Skin: Skin is warm and dry.  Psychiatric: She has a normal mood and affect.  Nursing note and vitals reviewed.   Urgent Care Course   Clinical  Course    Procedures (including critical care time)  Labs Review Labs Reviewed - No data to display  Imaging Review No results found.   Visual Acuity Review  Right Eye Distance:   Left Eye Distance:   Bilateral Distance:    Right Eye Near:   Left Eye Near:    Bilateral Near:         MDM   1. Myofasciitis   2. Pain in the muscles   3. Chronic lumbar pain    Meds ordered this encounter  Medications  . diclofenac sodium (VOLTAREN) 1 % GEL    Sig: Apply 1 application topically 4 (four) times daily.    Dispense:  100 g    Refill:  0    Order Specific Question:   Supervising Provider    Answer:   Charm Rings [id number]  . meloxicam (MOBIC) 7.5 MG tablet    Sig: Take 1 tablet (7.5 mg total) by mouth daily.    Dispense:  12 tablet    Refill:  0    Order Specific Question:   Supervising Provider    Answer:   Micheline Chapman   F/u with PCP and continue with Sports medicine. Heat to muscles., Stretches.    Hayden Rasmussen, NP 03/25/16 [phone]

## 2016-04-20 ENCOUNTER — Ambulatory Visit (HOSPITAL_COMMUNITY): Payer: Self-pay | Admitting: Psychiatry

## 2016-08-24 ENCOUNTER — Encounter (HOSPITAL_COMMUNITY): Payer: Self-pay | Admitting: Family Medicine

## 2016-08-24 ENCOUNTER — Ambulatory Visit (HOSPITAL_COMMUNITY)
Admission: EM | Admit: 2016-08-24 | Discharge: 2016-08-24 | Disposition: A | Discharge status: Home / Self Care | Payer: BLUE CROSS/BLUE SHIELD | Attending: Family Medicine | Admitting: Family Medicine

## 2016-08-24 DIAGNOSIS — R300 Dysuria: Secondary | ICD-10-CM | POA: Diagnosis not present

## 2016-08-24 LAB — POCT URINALYSIS DIP (DEVICE)
BILIRUBIN URINE: NEGATIVE
Glucose, UA: NEGATIVE mg/dL
HGB URINE DIPSTICK: NEGATIVE
Leukocytes, UA: NEGATIVE
NITRITE: NEGATIVE
PH: 5.5 (ref 5.0–8.0)
Protein, ur: NEGATIVE mg/dL
Specific Gravity, Urine: 1.025 (ref 1.005–1.030)
UROBILINOGEN UA: 0.2 mg/dL (ref 0.0–1.0)

## 2016-08-24 MED ORDER — ESTROGENS, CONJUGATED 0.625 MG/GM VA CREA: TOPICAL_CREAM | VAGINAL | 1 refills | Status: DC

## 2016-08-24 NOTE — ED Triage Notes (Signed)
Pt here for 1 month of burning with urination. denies anything else. Denies any vaginal bleeding or discharge.

## 2016-08-24 NOTE — ED Provider Notes (Signed)
[facility]    CSN: [id number] Arrival date & time: 08/24/16  1113     History   Chief Complaint Chief Complaint  Patient presents with  . Dysuria    HPI Alyssa Avila is a 46 y.o. female.   The history is provided by the patient.  Dysuria  Pain quality:  Burning Pain severity:  Mild Onset quality:  Gradual Duration:  1 month Progression:  Improving Chronicity:  New Recent urinary tract infections: no   Relieved by:  Nothing Worsened by:  Nothing Ineffective treatments:  None tried Urinary symptoms: frequent urination   Associated symptoms: no abdominal pain, no fever, no flank pain, no genital lesions and no vaginal discharge   Risk factors: no recurrent urinary tract infections     Past Medical History:  Diagnosis Date  . Depression   . Schizophrenia [facility](HCC)     Patient Active Problem List   Diagnosis Date Noted  . MDD (major depressive disorder), recurrent, with catatonic features (HCC) 12/09/2015  . Depression, major, recurrent, severe with psychosis (HCC) 12/09/2015  . Right shoulder pain 06/18/2015  . 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 No pertinent surgical history.  OB History    No data available       Home Medications    Prior to Admission medications   Medication Sig Start Date End Date Taking? Authorizing Provider  diclofenac sodium (VOLTAREN) 1 % GEL Apply 1 application topically 4 (four) times daily. 03/25/16   Hayden Rasmussenavid Mabe, NP  escitalopram (LEXAPRO) 20 MG tablet Take 1 tablet (20 mg total) by mouth daily. 03/09/16   Charm RingsJamison Y Lord, NP  meloxicam (MOBIC) 7.5 MG tablet Take 1 tablet (7.5 mg total) by mouth daily. 03/25/16   Hayden Rasmussenavid Mabe, NP  OLANZapine (ZYPREXA) 15 MG tablet Take 1 tablet (15 mg total) by mouth at bedtime. 03/09/16   Charm RingsJamison Y Lord, NP  temazepam (RESTORIL) 15 MG capsule Take 1 capsule (15 mg total) by mouth at bedtime. 03/09/16   Charm RingsJamison Y Lord, NP  traZODone (DESYREL) 150 MG tablet Take 1 tablet  (150 mg total) by mouth at bedtime. 03/09/16   Charm RingsJamison Y Lord, NP    Family History History reviewed. No pertinent family history.  Social History Social History  Substance Use Topics  . Smoking status: Never Smoker  . Smokeless tobacco: Never Used  . Alcohol use No     Allergies   Patient has no known allergies.   Review of Systems Review of Systems  Constitutional: Negative for fever.  Gastrointestinal: Negative.  Negative for abdominal pain.  Genitourinary: Positive for dysuria, frequency and urgency. Negative for flank pain, pelvic pain, vaginal bleeding, vaginal discharge and vaginal pain.  All other systems reviewed and are negative.    Physical Exam Triage Vital Signs ED Triage Vitals [08/24/16 1233]  Enc Vitals Group     BP 103/68     Pulse Rate 71     Resp 18     Temp 98.1 F (36.7 C)     Temp src      SpO2 100 %     Weight      Height      Head Circumference      Peak Flow      Pain Score      Pain Loc      Pain Edu?      Excl. in GC?    No data found.   Updated Vital Signs BP 103/68  Pulse 71   Temp 98.1 F (36.7 C)   Resp 18   SpO2 100%   Visual Acuity Right Eye Distance:   Left Eye Distance:   Bilateral Distance:    Right Eye Near:   Left Eye Near:    Bilateral Near:     Physical Exam  Constitutional: She is oriented to person, place, and time. She appears well-developed and well-nourished. No distress.  Abdominal: Soft. Bowel sounds are normal. She exhibits no mass. There is no tenderness. There is no rebound.  Neurological: She is alert and oriented to person, place, and time.  Skin: Skin is warm and dry.  Nursing note and vitals reviewed.    UC Treatments / Results  Labs (all labs ordered are listed, but only abnormal results are displayed) Labs Reviewed - No data to display  EKG  EKG Interpretation None       Radiology No results found.  Procedures Procedures (including critical care time)  Medications  Ordered in UC Medications - No data to display   Initial Impression / Assessment and Plan / UC Course  I have reviewed the triage vital signs and the nursing notes.  Pertinent labs & imaging results that were available during my care of the patient were reviewed by me and considered in my medical decision making (see chart for details).  Clinical Course       Final Clinical Impressions(s) / UC Diagnoses   Final diagnoses:  None    New Prescriptions New Prescriptions   No medications on file     Linna Hoff, MD 09/07/16 2102

## 2016-08-25 LAB — URINE CULTURE

## 2016-09-29 IMAGING — CR DG LUMBAR SPINE COMPLETE 4+V
5 series · 5 of 5 positions shown · non-contrast
Comparison: None.

CLINICAL DATA: Back pain, neck pain for years, no known injury

EXAM:
LUMBAR SPINE - COMPLETE 4+ VIEW

[t lumbar spine ap]
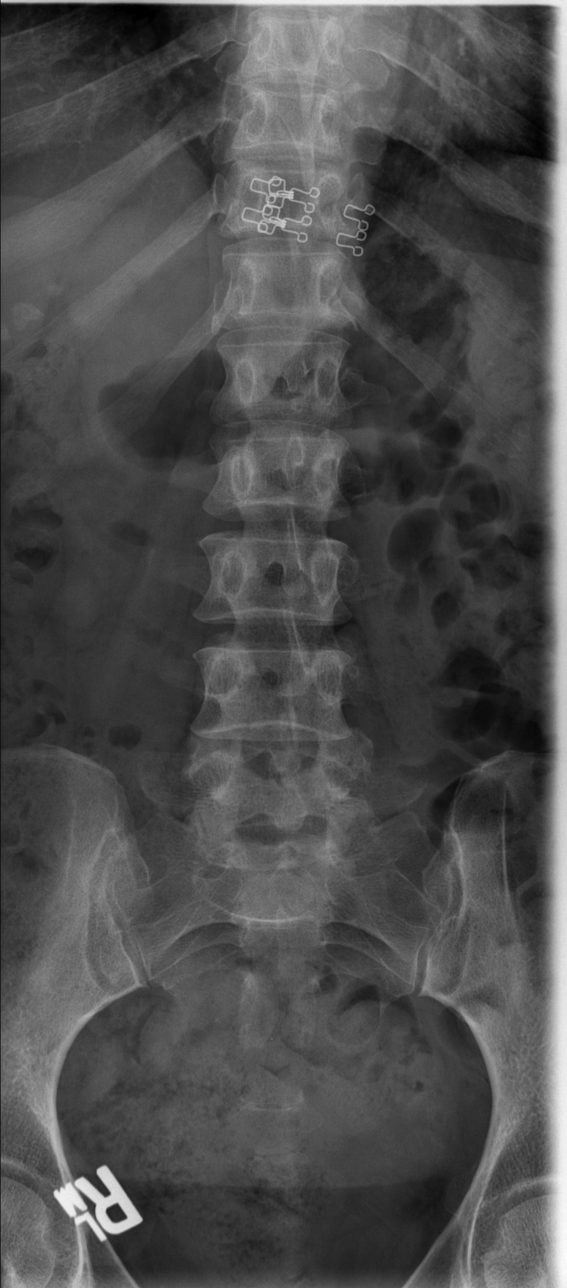

[t lumbar spine obl (1 of 2)]
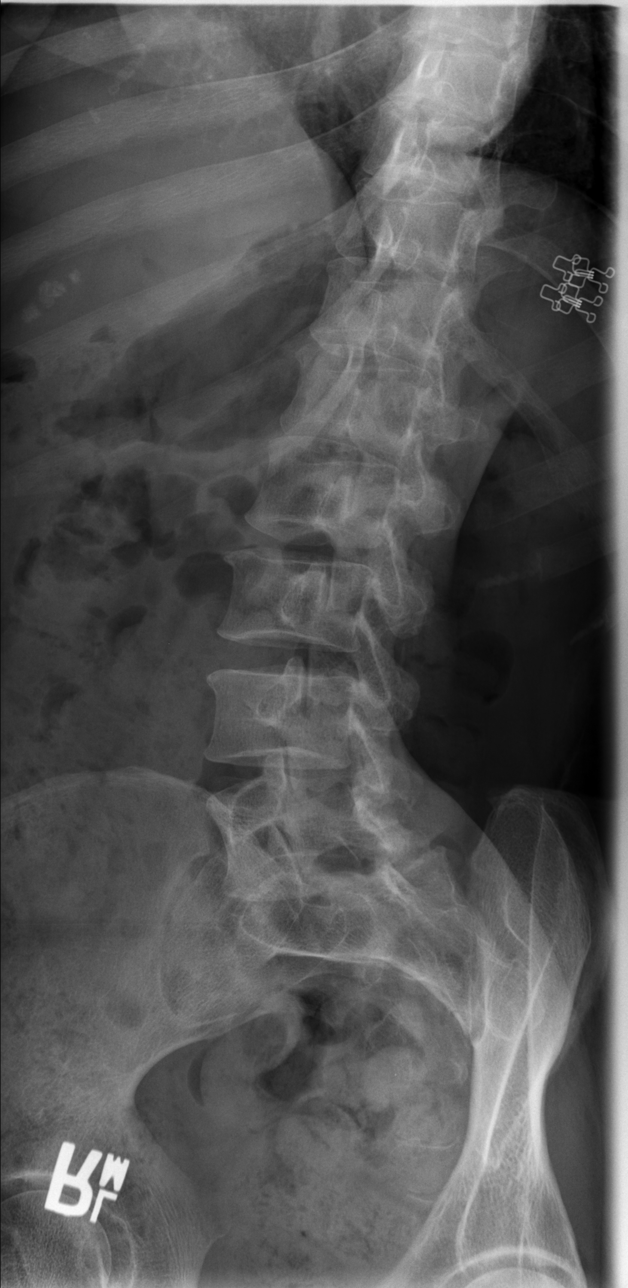

[t lumbar spine obl (2 of 2)]
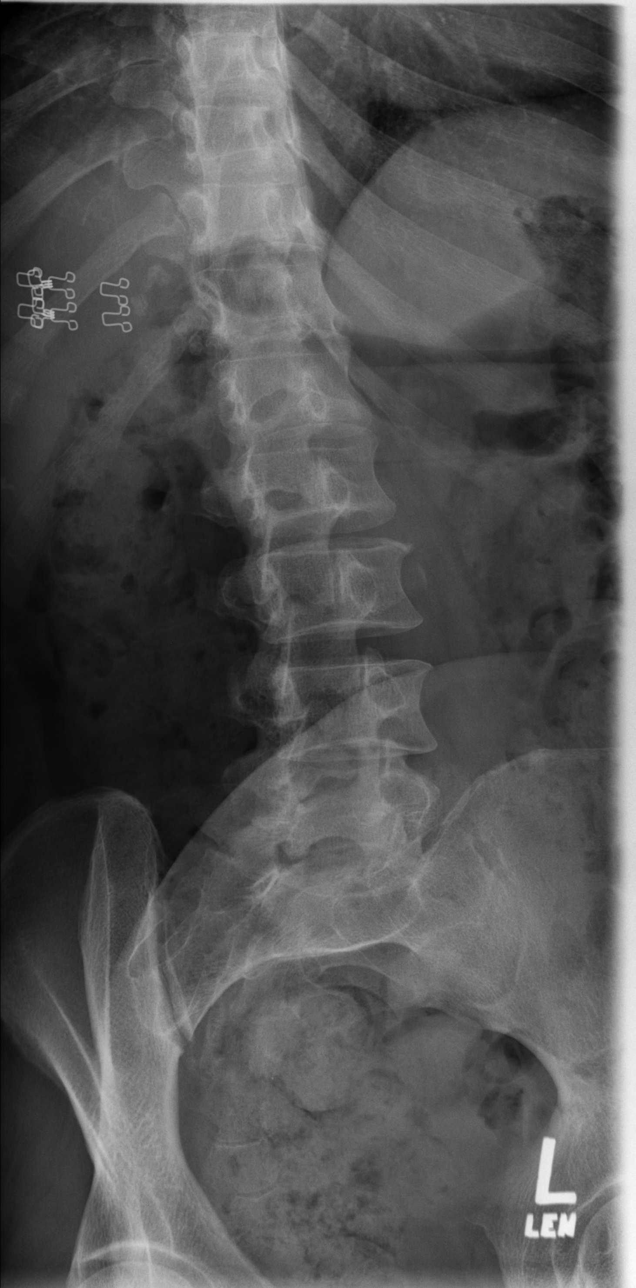

[t lumbar spine lat]
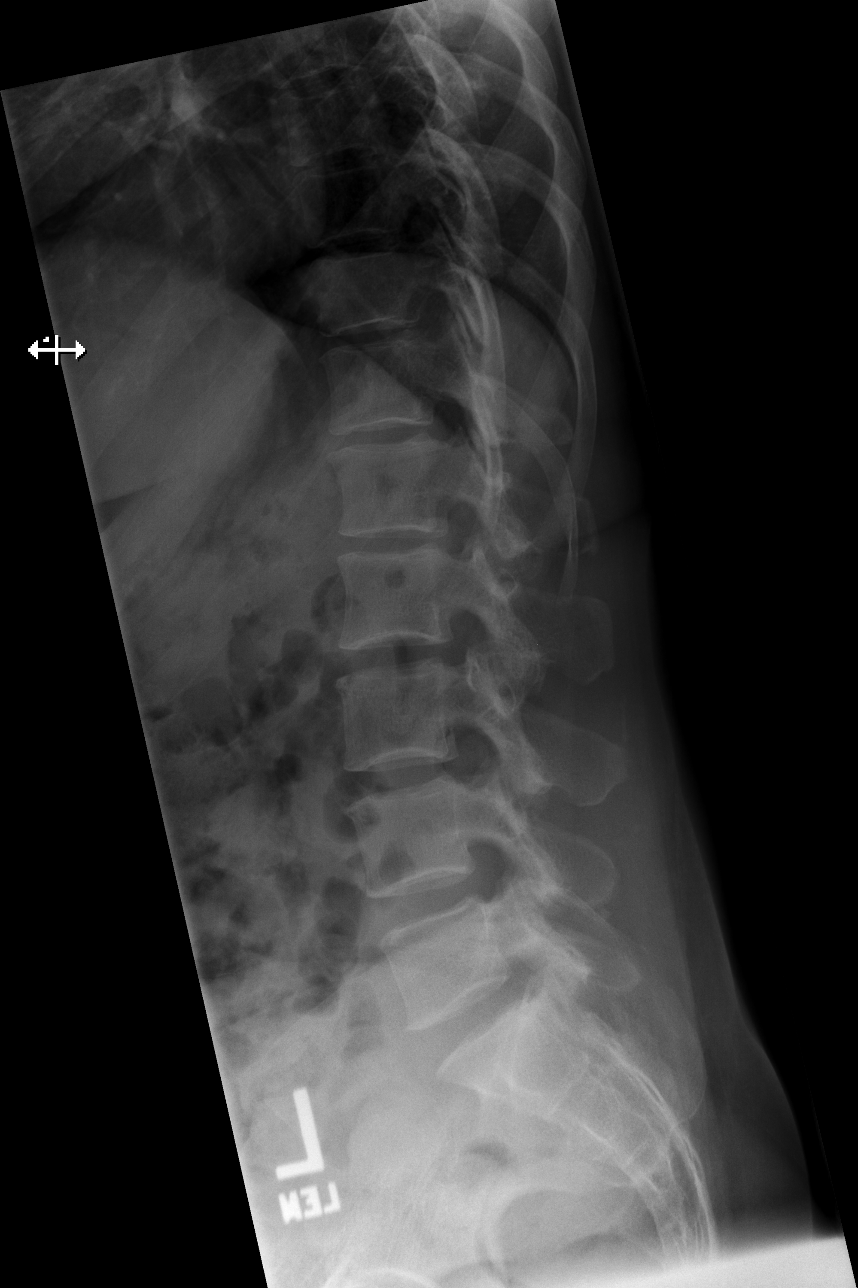

[t lumbar l-5 s-1 spot]
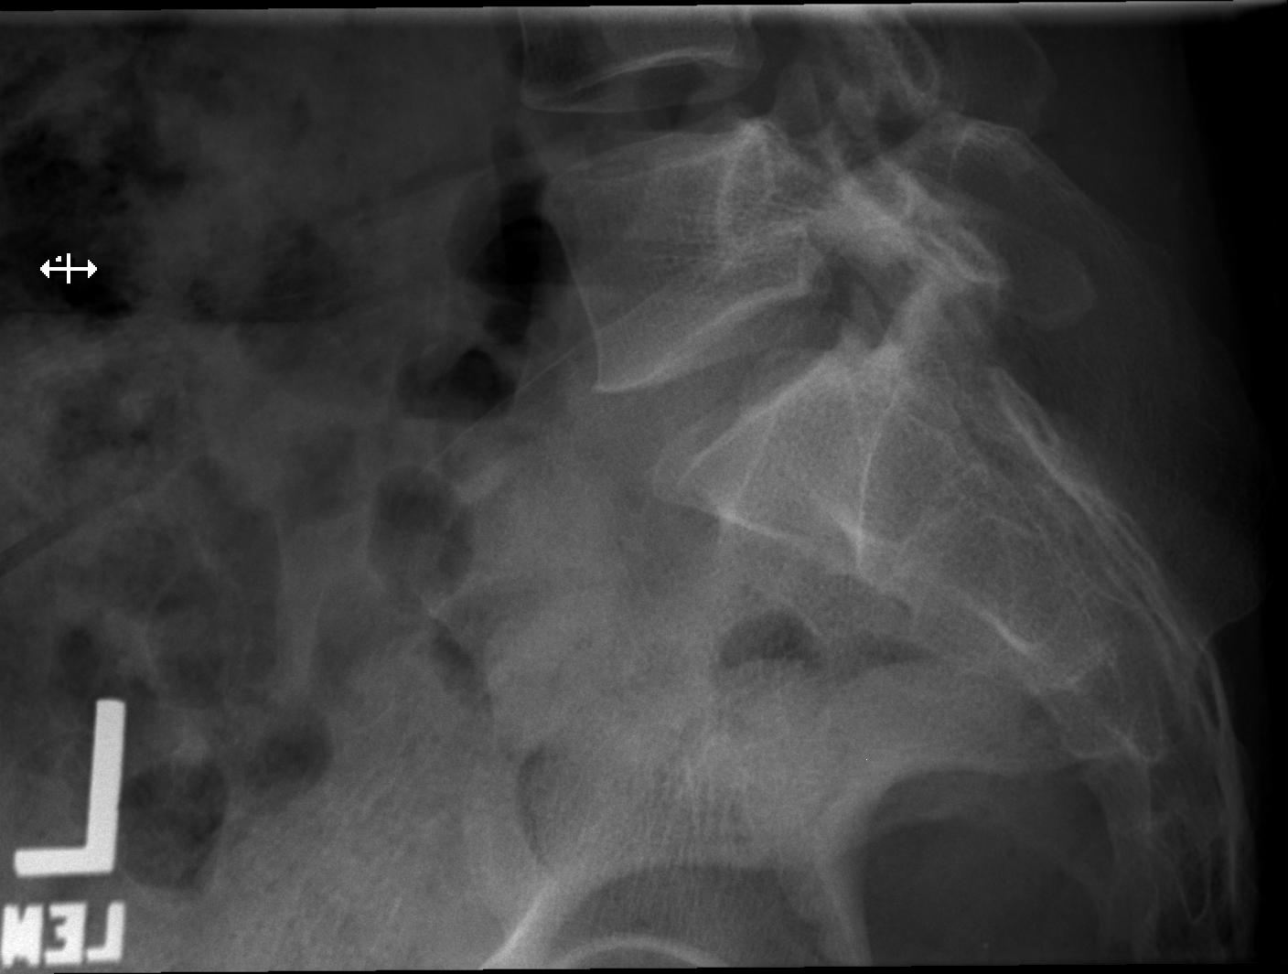

[5 of 5 positions shown; findings below may reference images not displayed]

FINDINGS: Five views of the lumbar spine submitted. No acute fracture or
subluxation. Minimal anterior spurring upper endplate of L4
vertebral body. Minimal anterior spurring upper endplate of L1 and
L3 vertebral body. Alignment and vertebral body heights are
preserved.
IMPRESSION: No acute fracture or subluxation.  Minimal degenerative changes.

## 2016-11-11 ENCOUNTER — Ambulatory Visit (INDEPENDENT_AMBULATORY_CARE_PROVIDER_SITE_OTHER): Payer: BLUE CROSS/BLUE SHIELD | Admitting: Physician Assistant

## 2016-11-11 ENCOUNTER — Encounter (INDEPENDENT_AMBULATORY_CARE_PROVIDER_SITE_OTHER): Payer: Self-pay | Admitting: Physician Assistant

## 2016-11-11 VITALS — BP 119/79 | HR 68 | Temp 98.1°F | Ht 62.5 in | Wt 158.6 lb

## 2016-11-11 DIAGNOSIS — G4700 Insomnia, unspecified: Secondary | ICD-10-CM | POA: Diagnosis not present

## 2016-11-11 DIAGNOSIS — K0889 Other specified disorders of teeth and supporting structures: Secondary | ICD-10-CM

## 2016-11-11 DIAGNOSIS — Z0000 Encounter for general adult medical examination without abnormal findings: Secondary | ICD-10-CM | POA: Diagnosis not present

## 2016-11-11 MED ORDER — MELATONIN 5 MG PO TABS: 1.0000 | ORAL_TABLET | Freq: Every day | ORAL | 1 refills | Status: DC

## 2016-11-11 NOTE — Progress Notes (Signed)
Subjective:  Patient ID: Alyssa Avila, female    DOB: 07/08/1971  Age: 46 y.o. MRN: [id number]  CC: toothache   HPI Alyssa Avila is a 46 y.o. female with no significant PMH presents to have a physical exam done as ordered by her dentist. She said she will need a tooth extraction. Has pain in the right upper molars. Denies f/c/n/v, CP, SOB, HA, abdominal pain, rash, or GI/GU.       Incidentally through ROS, patient is found to have insomnia. She had taken Temazepam for some time and feels much better in regards to her sleep. Only has some occasional disturbances but doing better overall.     Outpatient Medications Prior to Visit  Medication Sig Dispense Refill  . escitalopram (LEXAPRO) 20 MG tablet Take 1 tablet (20 mg total) by mouth daily. 30 tablet 1  . meloxicam (MOBIC) 7.5 MG tablet Take 1 tablet (7.5 mg total) by mouth daily. 12 tablet 0  . conjugated estrogens (PREMARIN) vaginal cream Apply small amt ext at hs mwf each week (Patient not taking: Reported on 11/11/2016) 42.5 g 1  . OLANZapine (ZYPREXA) 15 MG tablet Take 1 tablet (15 mg total) by mouth at bedtime. 30 tablet 1  . traZODone (DESYREL) 150 MG tablet Take 1 tablet (150 mg total) by mouth at bedtime. 30 tablet 0  . diclofenac sodium (VOLTAREN) 1 % GEL Apply 1 application topically 4 (four) times daily. (Patient not taking: Reported on 11/11/2016) 100 g 0  . temazepam (RESTORIL) 15 MG capsule Take 1 capsule (15 mg total) by mouth at bedtime. 30 capsule 0   No facility-administered medications prior to visit.      ROS Review of Systems  Constitutional: Negative for chills, fever and malaise/fatigue.  Eyes: Negative for blurred vision.  Respiratory: Negative for shortness of breath.   Cardiovascular: Negative for chest pain and palpitations.  Gastrointestinal: Negative for abdominal pain and nausea.  Genitourinary: Negative for dysuria and hematuria.  Musculoskeletal: Negative for joint pain and myalgias.  Skin:  Negative for rash.  Neurological: Negative for tingling and headaches.  Psychiatric/Behavioral: Negative for depression. The patient has insomnia. The patient is not nervous/anxious.     Objective:  BP 119/79 (BP Location: Left Arm, Patient Position: Sitting, Cuff Size: Normal)   Pulse 68   Temp 98.1 F (36.7 C) (Oral)   Ht 5' 2.5" (1.588 m)   Wt 158 lb 9.6 oz (71.9 kg)   LMP  (Exact Date)   SpO2 98%   BMI 28.55 kg/m   BP/Weight 11/11/2016 08/24/2016 03/25/2016  Systolic BP 119 103 140  Diastolic BP 79 68 72  Wt. (Lbs) 158.6 - -  BMI 28.55 - -  Some encounter information is confidential and restricted. Go to Review Flowsheets activity to see all data.      Physical Exam  Constitutional: She is oriented to person, place, and time.  Well developed, overweight, NAD, polite  HENT:  Head: Normocephalic and atraumatic.  Eyes: No scleral icterus.  Neck: Normal range of motion. Neck supple. No thyromegaly present.  Cardiovascular: Normal rate, regular rhythm and normal heart sounds.   Pulmonary/Chest: Effort normal and breath sounds normal.  Musculoskeletal: She exhibits no edema.  Neurological: She is alert and oriented to person, place, and time. No cranial nerve deficit.  Skin: Skin is warm and dry.  Psychiatric: She has a normal mood and affect. Her behavior is normal. Thought content normal.  Vitals reviewed.    Assessment & Plan:   1.  Toothache  2. Insomnia, unspecified type  3. Routine general medical examination at a health care facility - HIV antibody - CBC With Differential - Comprehensive metabolic panel - PT AND PTT   Meds ordered this encounter  Medications  . Melatonin 5 MG TABS    Sig: Take 1 tablet (5 mg total) by mouth at bedtime.    Dispense:  30 tablet    Refill:  1    Order Specific Question:   Supervising Provider    Answer:   Quentin AngstJEGEDE, OLUGBEMIGA E [[id number]]    Follow-up: Return if symptoms worsen or fail to improve, for general physical.    Loletta Specteroger David Gomez PA

## 2016-11-11 NOTE — Patient Instructions (Signed)
Insomnia Insomnia is a sleep disorder that makes it difficult to fall asleep or to stay asleep. Insomnia can cause tiredness (fatigue), low energy, difficulty concentrating, mood swings, and poor performance at work or school. There are three different ways to classify insomnia:  Difficulty falling asleep.  Difficulty staying asleep.  Waking up too early in the morning. Any type of insomnia can be long-term (chronic) or short-term (acute). Both are common. Short-term insomnia usually lasts for three months or less. Chronic insomnia occurs at least three times a week for longer than three months. What are the causes? Insomnia may be caused by another condition, situation, or substance, such as:  Anxiety.  Certain medicines.  Gastroesophageal reflux disease (GERD) or other gastrointestinal conditions.  Asthma or other breathing conditions.  Restless legs syndrome, sleep apnea, or other sleep disorders.  Chronic pain.  Menopause. This may include hot flashes.  Stroke.  Abuse of alcohol, tobacco, or illegal drugs.  Depression.  Caffeine.  Neurological disorders, such as Alzheimer disease.  An overactive thyroid (hyperthyroidism). The cause of insomnia may not be known. What increases the risk? Risk factors for insomnia include:  Gender. Women are more commonly affected than men.  Age. Insomnia is more common as you get older.  Stress. This may involve your professional or personal life.  Income. Insomnia is more common in people with lower income.  Lack of exercise.  Irregular work schedule or night shifts.  Traveling between different time zones. What are the signs or symptoms? If you have insomnia, trouble falling asleep or trouble staying asleep is the main symptom. This may lead to other symptoms, such as:  Feeling fatigued.  Feeling nervous about going to sleep.  Not feeling rested in the morning.  Having trouble concentrating.  Feeling irritable,  anxious, or depressed. How is this treated? Treatment for insomnia depends on the cause. If your insomnia is caused by an underlying condition, treatment will focus on addressing the condition. Treatment may also include:  Medicines to help you sleep.  Counseling or therapy.  Lifestyle adjustments. Follow these instructions at home:  Take medicines only as directed by your health care provider.  Keep regular sleeping and waking hours. Avoid naps.  Keep a sleep diary to help you and your health care provider figure out what could be causing your insomnia. Include:  When you sleep.  When you wake up during the night.  How well you sleep.  How rested you feel the next day.  Any side effects of medicines you are taking.  What you eat and drink.  Make your bedroom a comfortable place where it is easy to fall asleep:  Put up shades or special blackout curtains to block light from outside.  Use a white noise machine to block noise.  Keep the temperature cool.  Exercise regularly as directed by your health care provider. Avoid exercising right before bedtime.  Use relaxation techniques to manage stress. Ask your health care provider to suggest some techniques that may work well for you. These may include:  Breathing exercises.  Routines to release muscle tension.  Visualizing peaceful scenes.  Cut back on alcohol, caffeinated beverages, and cigarettes, especially close to bedtime. These can disrupt your sleep.  Do not overeat or eat spicy foods right before bedtime. This can lead to digestive discomfort that can make it hard for you to sleep.  Limit screen use before bedtime. This includes:  Watching TV.  Using your smartphone, tablet, and computer.  Stick to a   routine. This can help you fall asleep faster. Try to do a quiet activity, brush your teeth, and go to bed at the same time each night.  Get out of bed if you are still awake after 15 minutes of trying to  sleep. Keep the lights down, but try reading or doing a quiet activity. When you feel sleepy, go back to bed.  Make sure that you drive carefully. Avoid driving if you feel very sleepy.  Keep all follow-up appointments as directed by your health care provider. This is important. Contact a health care provider if:  You are tired throughout the day or have trouble in your daily routine due to sleepiness.  You continue to have sleep problems or your sleep problems get worse. Get help right away if:  You have serious thoughts about hurting yourself or someone else. This information is not intended to replace advice given to you by your health care provider. Make sure you discuss any questions you have with your health care provider. Document Released: 08/06/2000 Document Revised: 01/09/2016 Document Reviewed: 05/10/2014 Elsevier Interactive Patient Education  2017 [employer]vier Inc[employer]  

## 2016-11-12 ENCOUNTER — Ambulatory Visit (INDEPENDENT_AMBULATORY_CARE_PROVIDER_SITE_OTHER): Payer: Self-pay | Admitting: Physician Assistant

## 2016-11-12 LAB — COMPREHENSIVE METABOLIC PANEL
ALT: 16 IU/L (ref 0–32)
AST: 18 IU/L (ref 0–40)
Albumin/Globulin Ratio: 1.6 (ref 1.2–2.2)
Albumin: 4.2 g/dL (ref 3.5–5.5)
Alkaline Phosphatase: 50 IU/L (ref 39–117)
BUN/Creatinine Ratio: 17 (ref 9–23)
BUN: 9 mg/dL (ref 6–24)
Bilirubin Total: 0.2 mg/dL (ref 0.0–1.2)
CALCIUM: 9.3 mg/dL (ref 8.7–10.2)
CO2: 27 mmol/L (ref 18–29)
CREATININE: 0.52 mg/dL — AB (ref 0.57–1.00)
Chloride: 102 mmol/L (ref 96–106)
GFR calc Af Amer: 132 mL/min/{1.73_m2} (ref >59)
GFR, EST NON AFRICAN AMERICAN: 114 mL/min/{1.73_m2} (ref >59)
GLOBULIN, TOTAL: 2.6 g/dL (ref 1.5–4.5)
Glucose: 101 mg/dL — ABNORMAL HIGH (ref 65–99)
Potassium: 4.1 mmol/L (ref 3.5–5.2)
SODIUM: 143 mmol/L (ref 134–144)
Total Protein: 6.8 g/dL (ref 6.0–8.5)

## 2016-11-12 LAB — CBC WITH DIFFERENTIAL
BASOS: 0 %
Basophils Absolute: 0 10*3/uL (ref 0.0–0.2)
EOS (ABSOLUTE): 0.4 10*3/uL (ref 0.0–0.4)
Eos: 9 %
HEMATOCRIT: 35.1 % (ref 34.0–46.6)
Hemoglobin: 11.4 g/dL (ref 11.1–15.9)
Immature Grans (Abs): 0 10*3/uL (ref 0.0–0.1)
Immature Granulocytes: 0 %
Lymphocytes Absolute: 1.6 10*3/uL (ref 0.7–3.1)
Lymphs: 34 %
MCH: 27.9 pg (ref 26.6–33.0)
MCHC: 32.5 g/dL (ref 31.5–35.7)
MCV: 86 fL (ref 79–97)
MONOS ABS: 0.3 10*3/uL (ref 0.1–0.9)
Monocytes: 7 %
NEUTROS ABS: 2.4 10*3/uL (ref 1.4–7.0)
Neutrophils: 50 %
RBC: 4.08 x10E6/uL (ref 3.77–5.28)
RDW: 13.8 % (ref 12.3–15.4)
WBC: 4.8 10*3/uL (ref 3.4–10.8)

## 2016-11-12 LAB — HIV ANTIBODY (ROUTINE TESTING W REFLEX): HIV Screen 4th Generation wRfx: NONREACTIVE

## 2016-11-12 LAB — PT AND PTT
APTT: 27 s (ref 24–33)
INR: 1 (ref 0.8–1.2)
PROTHROMBIN TIME: 10.3 s (ref 9.1–12.0)

## 2017-11-12 ENCOUNTER — Encounter (HOSPITAL_COMMUNITY): Payer: Self-pay | Admitting: Emergency Medicine

## 2017-11-12 ENCOUNTER — Other Ambulatory Visit: Payer: Self-pay

## 2017-11-12 DIAGNOSIS — G4700 Insomnia, unspecified: Secondary | ICD-10-CM | POA: Insufficient documentation

## 2017-11-12 DIAGNOSIS — R45851 Suicidal ideations: Secondary | ICD-10-CM | POA: Insufficient documentation

## 2017-11-12 DIAGNOSIS — F333 Major depressive disorder, recurrent, severe with psychotic symptoms: Secondary | ICD-10-CM | POA: Insufficient documentation

## 2017-11-12 DIAGNOSIS — Z79899 Other long term (current) drug therapy: Secondary | ICD-10-CM | POA: Insufficient documentation

## 2017-11-12 DIAGNOSIS — R52 Pain, unspecified: Secondary | ICD-10-CM | POA: Diagnosis present

## 2017-11-12 LAB — CBC
HCT: 35.9 % — ABNORMAL LOW (ref 36.0–46.0)
Hemoglobin: 11.9 g/dL — ABNORMAL LOW (ref 12.0–15.0)
MCH: 28.3 pg (ref 26.0–34.0)
MCHC: 33.1 g/dL (ref 30.0–36.0)
MCV: 85.3 fL (ref 78.0–100.0)
PLATELETS: 299 10*3/uL (ref 150–400)
RBC: 4.21 MIL/uL (ref 3.87–5.11)
RDW: 13.7 % (ref 11.5–15.5)
WBC: 5.9 10*3/uL (ref 4.0–10.5)

## 2017-11-12 LAB — COMPREHENSIVE METABOLIC PANEL
ALT: 19 U/L (ref 14–54)
ANION GAP: 9 (ref 5–15)
AST: 21 U/L (ref 15–41)
Albumin: 3.8 g/dL (ref 3.5–5.0)
Alkaline Phosphatase: 49 U/L (ref 38–126)
BUN: 5 mg/dL — ABNORMAL LOW (ref 6–20)
CALCIUM: 9.1 mg/dL (ref 8.9–10.3)
CHLORIDE: 103 mmol/L (ref 101–111)
CO2: 27 mmol/L (ref 22–32)
CREATININE: 0.6 mg/dL (ref 0.44–1.00)
Glucose, Bld: 92 mg/dL (ref 65–99)
Potassium: 3.7 mmol/L (ref 3.5–5.1)
Sodium: 139 mmol/L (ref 135–145)
Total Bilirubin: 0.3 mg/dL (ref 0.3–1.2)
Total Protein: 6.8 g/dL (ref 6.5–8.1)

## 2017-11-12 LAB — RAPID URINE DRUG SCREEN, HOSP PERFORMED
Amphetamines: NOT DETECTED
Barbiturates: NOT DETECTED
Benzodiazepines: NOT DETECTED
Cocaine: NOT DETECTED
OPIATES: NOT DETECTED
Tetrahydrocannabinol: NOT DETECTED

## 2017-11-12 LAB — I-STAT BETA HCG BLOOD, ED (MC, WL, AP ONLY): I-stat hCG, quantitative: <5 m[IU]/mL (ref <5)

## 2017-11-12 LAB — SALICYLATE LEVEL

## 2017-11-12 LAB — ACETAMINOPHEN LEVEL: Acetaminophen (Tylenol), Serum: <10 ug/mL — ABNORMAL LOW (ref 10–30)

## 2017-11-12 LAB — ETHANOL

## 2017-11-12 NOTE — ED Notes (Signed)
Pt. Was called for vitals and no answer.
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
## 2017-11-13 ENCOUNTER — Other Ambulatory Visit: Payer: Self-pay

## 2017-11-13 ENCOUNTER — Emergency Department (HOSPITAL_COMMUNITY): Payer: BLUE CROSS/BLUE SHIELD

## 2017-11-13 ENCOUNTER — Emergency Department (EMERGENCY_DEPARTMENT_HOSPITAL)
Admission: EM | Admit: 2017-11-13 | Discharge: 2017-11-15 | Disposition: A | Discharge status: Other Acute Inpatient Hospital | Payer: BLUE CROSS/BLUE SHIELD | Source: Home / Self Care | Attending: Emergency Medicine | Admitting: Emergency Medicine

## 2017-11-13 ENCOUNTER — Encounter (HOSPITAL_COMMUNITY): Payer: Self-pay | Admitting: Emergency Medicine

## 2017-11-13 ENCOUNTER — Emergency Department (HOSPITAL_COMMUNITY)
Admission: EM | Admit: 2017-11-13 | Discharge: 2017-11-13 | Disposition: A | Discharge status: Home / Self Care | Payer: BLUE CROSS/BLUE SHIELD | Attending: Emergency Medicine | Admitting: Emergency Medicine

## 2017-11-13 DIAGNOSIS — Z046 Encounter for general psychiatric examination, requested by authority: Secondary | ICD-10-CM

## 2017-11-13 DIAGNOSIS — R45851 Suicidal ideations: Secondary | ICD-10-CM

## 2017-11-13 DIAGNOSIS — G4700 Insomnia, unspecified: Secondary | ICD-10-CM

## 2017-11-13 DIAGNOSIS — F32A Depression, unspecified: Secondary | ICD-10-CM

## 2017-11-13 DIAGNOSIS — F333 Major depressive disorder, recurrent, severe with psychotic symptoms: Secondary | ICD-10-CM | POA: Insufficient documentation

## 2017-11-13 DIAGNOSIS — R52 Pain, unspecified: Secondary | ICD-10-CM

## 2017-11-13 DIAGNOSIS — F322 Major depressive disorder, single episode, severe without psychotic features: Secondary | ICD-10-CM

## 2017-11-13 DIAGNOSIS — Z79899 Other long term (current) drug therapy: Secondary | ICD-10-CM

## 2017-11-13 DIAGNOSIS — F329 Major depressive disorder, single episode, unspecified: Secondary | ICD-10-CM

## 2017-11-13 LAB — COMPREHENSIVE METABOLIC PANEL
ALT: 17 U/L (ref 14–54)
AST: 20 U/L (ref 15–41)
Albumin: 3.9 g/dL (ref 3.5–5.0)
Alkaline Phosphatase: 50 U/L (ref 38–126)
Anion gap: 12 (ref 5–15)
BUN: <5 mg/dL — ABNORMAL LOW (ref 6–20)
CHLORIDE: 104 mmol/L (ref 101–111)
CO2: 23 mmol/L (ref 22–32)
Calcium: 9.3 mg/dL (ref 8.9–10.3)
Creatinine, Ser: 0.52 mg/dL (ref 0.44–1.00)
GFR calc non Af Amer: >60 mL/min (ref >60)
Glucose, Bld: 101 mg/dL — ABNORMAL HIGH (ref 65–99)
Potassium: 3.8 mmol/L (ref 3.5–5.1)
SODIUM: 139 mmol/L (ref 135–145)
Total Bilirubin: 0.4 mg/dL (ref 0.3–1.2)
Total Protein: 7 g/dL (ref 6.5–8.1)

## 2017-11-13 LAB — CBC WITH DIFFERENTIAL/PLATELET
Basophils Absolute: 0 10*3/uL (ref 0.0–0.1)
Basophils Relative: 1 %
Eosinophils Absolute: 0.1 10*3/uL (ref 0.0–0.7)
Eosinophils Relative: 2 %
HCT: 37.1 % (ref 36.0–46.0)
HEMOGLOBIN: 12.4 g/dL (ref 12.0–15.0)
LYMPHS ABS: 1.4 10*3/uL (ref 0.7–4.0)
LYMPHS PCT: 24 %
MCH: 28.6 pg (ref 26.0–34.0)
MCHC: 33.4 g/dL (ref 30.0–36.0)
MCV: 85.5 fL (ref 78.0–100.0)
MONOS PCT: 5 %
Monocytes Absolute: 0.3 10*3/uL (ref 0.1–1.0)
NEUTROS PCT: 68 %
Neutro Abs: 4 10*3/uL (ref 1.7–7.7)
Platelets: 320 10*3/uL (ref 150–400)
RBC: 4.34 MIL/uL (ref 3.87–5.11)
RDW: 13.7 % (ref 11.5–15.5)
WBC: 5.9 10*3/uL (ref 4.0–10.5)

## 2017-11-13 LAB — I-STAT BETA HCG BLOOD, ED (MC, WL, AP ONLY): I-stat hCG, quantitative: <5 m[IU]/mL (ref <5)

## 2017-11-13 LAB — I-STAT CG4 LACTIC ACID, ED: LACTIC ACID, VENOUS: 0.63 mmol/L (ref 0.5–1.9)

## 2017-11-13 MED ORDER — MELATONIN 5 MG PO TABS
1.0000 | ORAL_TABLET | Freq: Every day | ORAL | Status: DC
  Filled 2017-11-13: qty 1

## 2017-11-13 MED ORDER — ZOLPIDEM TARTRATE 5 MG PO TABS: 5.0000 mg | ORAL_TABLET | Freq: Every evening | ORAL | Status: DC | PRN

## 2017-11-13 MED ORDER — OLANZAPINE 5 MG PO TABS
15.0000 mg | ORAL_TABLET | Freq: Every day | ORAL | Status: DC
  Administered 2017-11-13 – 2017-11-14 (×2): 15 mg via ORAL
  Filled 2017-11-13: qty 2
  Filled 2017-11-13 (×2): qty 3

## 2017-11-13 MED ORDER — IBUPROFEN 400 MG PO TABS
600.0000 mg | ORAL_TABLET | Freq: Three times a day (TID) | ORAL | Status: DC | PRN
  Administered 2017-11-15: 600 mg via ORAL
  Filled 2017-11-13: qty 1

## 2017-11-13 MED ORDER — TRAZODONE HCL 50 MG PO TABS
150.0000 mg | ORAL_TABLET | Freq: Every day | ORAL | Status: DC
Start: 2017-11-13 — End: 2017-11-15
  Administered 2017-11-13: 22:00:00 150 mg via ORAL
  Administered 2017-11-14: 100 mg via ORAL
  Filled 2017-11-13 (×2): qty 1

## 2017-11-13 MED ORDER — ESCITALOPRAM OXALATE 10 MG PO TABS
20.0000 mg | ORAL_TABLET | Freq: Every day | ORAL | Status: DC
  Administered 2017-11-13 – 2017-11-14 (×2): 20 mg via ORAL
  Filled 2017-11-13 (×3): qty 2

## 2017-11-13 MED ORDER — MELATONIN 3 MG PO TABS
3.0000 mg | ORAL_TABLET | Freq: Every day | ORAL | Status: DC
  Filled 2017-11-13 (×2): qty 1

## 2017-11-13 MED ORDER — NAPROXEN 250 MG PO TABS
500.0000 mg | ORAL_TABLET | Freq: Once | ORAL | Status: AC
  Administered 2017-11-13: 500 mg via ORAL
  Filled 2017-11-13: qty 2

## 2017-11-13 MED ORDER — ZOLPIDEM TARTRATE 5 MG PO TABS: 5.0000 mg | ORAL_TABLET | Freq: Every evening | ORAL | 0 refills | Status: DC | PRN

## 2017-11-13 NOTE — ED Notes (Signed)
Provider gave pt discharge instructions.  RN gave pt clothing to change into, she left w/o signing paperwork or discharge instructions
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
## 2017-11-13 NOTE — ED Triage Notes (Signed)
Pt. Stated, My body hurts all over, started last night.
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
## 2017-11-13 NOTE — ED Notes (Signed)
Patient denies pain and is resting comfortably.  
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
## 2017-11-13 NOTE — ED Notes (Signed)
Sandwich bag and drink given to patient. NAD. Cooperative. Soft spoken and very appreciative. Will continue to monitor.

## 2017-11-14 DIAGNOSIS — F333 Major depressive disorder, recurrent, severe with psychotic symptoms: Secondary | ICD-10-CM | POA: Diagnosis not present

## 2017-11-14 DIAGNOSIS — R450 Nervousness: Secondary | ICD-10-CM | POA: Diagnosis not present

## 2017-11-14 DIAGNOSIS — G4700 Insomnia, unspecified: Secondary | ICD-10-CM

## 2017-11-14 DIAGNOSIS — Z91410 Personal history of adult physical and sexual abuse: Secondary | ICD-10-CM

## 2017-11-14 DIAGNOSIS — F061 Catatonic disorder due to known physiological condition: Secondary | ICD-10-CM

## 2017-11-14 DIAGNOSIS — Z818 Family history of other mental and behavioral disorders: Secondary | ICD-10-CM

## 2017-11-14 DIAGNOSIS — F419 Anxiety disorder, unspecified: Secondary | ICD-10-CM

## 2017-11-14 LAB — URINALYSIS, ROUTINE W REFLEX MICROSCOPIC
Bilirubin Urine: NEGATIVE
Glucose, UA: NEGATIVE mg/dL
Hgb urine dipstick: NEGATIVE
KETONES UR: NEGATIVE mg/dL
LEUKOCYTES UA: NEGATIVE
Nitrite: NEGATIVE
PH: 6 (ref 5.0–8.0)
Protein, ur: NEGATIVE mg/dL
SPECIFIC GRAVITY, URINE: 1.002 — AB (ref 1.005–1.030)

## 2017-11-14 MED ORDER — DOCUSATE SODIUM 100 MG PO CAPS
100.0000 mg | ORAL_CAPSULE | Freq: Once | ORAL | Status: AC
  Administered 2017-11-14: 100 mg via ORAL
  Filled 2017-11-14: qty 1

## 2017-11-14 NOTE — ED Notes (Signed)
Pt encouraged to try and sleep, pt told this RN that she slept all day today and she cannot fall asleep. Pt asked for a book to read to help her fall asleep. RN gave pt a few books to choose from to read.

## 2017-11-14 NOTE — ED Triage Notes (Signed)
PT provided  With a picture for drink of choice . Pt instructed  To drink picture full of liquid.
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
## 2017-11-14 NOTE — Progress Notes (Signed)
[facility]  Dr. Johnnye SimaAshraf Mikhail the accepting provider.  Call report to [phone] [email or web address]  notified PT IS TO BE IVC'D PRIOR TO TRANSPORT. IVC TO BE FAXED TO [phone] AND [phone]  Pt may be transported by Law Enforcement Pt scheduled  [facility] as soon as transport can be arranged.  Timmothy EulerJean T. Kaylyn LimSutter, MSW, LCSWA Disposition Clinical Social Work [phone] (cell) [phone] (office)  Addendum: Disposition CSW contacted Dr. Rubin PayorPickering, EDP to discuss involuntary commitment of patient.  EDP expressed concerns but agreed to submit IVC due to pt's inability to understand the need for inpatient treatment due to her current psychosis.

## 2017-11-14 NOTE — ED Triage Notes (Signed)
PT unable to be transported tonight because there is not a [place] Sheriff working . Request a call at [phone] for transportation.

## 2017-11-14 NOTE — ED Notes (Signed)
Called staffing office and there is no sitter available until 11pm, ED staff to sit with pt until sitter is available.

## 2017-11-14 NOTE — Progress Notes (Signed)
Pt continues to meet inpatient criteria per Fransisca KaufmannLaura Davis, PNP.  [facility], Irwin BrakemanAudrey M., notified.  Pt.  Referred out to the following hospitals: [facility]     [facility]  [facility]  [facility]  [facility]  [facility]        Disposition CSW will continue to follow for placement.  Timmothy EulerJean T. Kaylyn LimSutter, MSW, LCSWA Disposition Clinical Social Work [phone] (cell) [phone] (office)

## 2017-11-14 NOTE — ED Triage Notes (Signed)
Meets In -Pt  admission
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
## 2017-11-14 NOTE — ED Notes (Signed)
Second attempt faxing papers to magistrate, called first time and it hadn't been recieved

## 2017-11-14 NOTE — ED Triage Notes (Signed)
3 copies placed on PT clip board

## 2017-11-14 NOTE — ED Triage Notes (Signed)
Original  IVC placed in red folder, F[facility]  [facility] , Placed copy in Med. Rec. Drawer

## 2017-11-14 NOTE — Progress Notes (Signed)
CSW spoke with Magistrate. Magistrate requested that name on IVC paperwork be re-written clearer.   CSW faxed IVC to daytime and after hours Magistrate. CS[facility] requesting all IVC faxes be sent to After hour Magistrate this week. CSW updated ED secretaries.   Montine CircleKelsy Kalyn Dimattia, Silverio LayLCSWA [facility]  [phone]

## 2017-11-14 NOTE — ED Notes (Signed)
Pt making phone call at nurses station 

## 2017-11-14 NOTE — ED Notes (Signed)
During report, off going RN reported that pt was very sleepy this morning and that the pt had walked to the bathroom and was slumped over, RN had to sternal rub the pt to wake her up, due to this episode, this RN only administered 100mg  of trazodone to the pt.

## 2017-11-14 NOTE — ED Notes (Signed)
Pt standing at sink washing the cups the peanut butter came in while watching tv

## 2017-11-14 NOTE — ED Triage Notes (Signed)
TTS done 11-14-17

## 2017-11-14 NOTE — ED Triage Notes (Signed)
Pt up with one person assistance . Pt unable to void while in BR.  Pt was unsteady while standing in BR.  Pt was assisted  Back to room. Pt  So far drank 1/2 picture for liquid.  Pt instructed  To continue drinking fluid from picture.

## 2017-11-14 NOTE — Progress Notes (Signed)
CSW spoke with Shanda BumpsJessica [phone]) @[facility] who asked for pt's ETA. CSW spoke with Magda PaganiniAudrey, RN at [facility] and assessed that [facility] and that transportation was being coordinated. CSW informed Jessica @[facility] and assessed that if transportation could not be arranged tonight, pt would not lose her bed, but would hope that she could be transported in the morning or asap.   Wells GuilesSarah Ghali Morissette, LCSW, LCAS Disposition CSW [facility]/TTS [phone]

## 2017-11-15 NOTE — ED Notes (Signed)
Pt resting comfortably at this time, RN turned lights off in room.

## 2017-11-15 NOTE — ED Notes (Signed)
DR RAY INTO SEE PT, VITALS DONE [place] Sheriff into take pt  [facility], given packet of papers

## 2017-12-12 ENCOUNTER — Emergency Department
Admission: EM | Admit: 2017-12-12 | Discharge: 2017-12-12 | Disposition: A | Discharge status: Other Acute Inpatient Hospital | Payer: Self-pay | Attending: Emergency Medicine | Admitting: Emergency Medicine

## 2017-12-12 DIAGNOSIS — F329 Major depressive disorder, single episode, unspecified: Secondary | ICD-10-CM | POA: Insufficient documentation

## 2017-12-12 DIAGNOSIS — Z79899 Other long term (current) drug therapy: Secondary | ICD-10-CM | POA: Insufficient documentation

## 2017-12-12 DIAGNOSIS — F319 Bipolar disorder, unspecified: Secondary | ICD-10-CM | POA: Insufficient documentation

## 2017-12-12 DIAGNOSIS — R451 Restlessness and agitation: Secondary | ICD-10-CM | POA: Insufficient documentation

## 2017-12-12 DIAGNOSIS — F29 Unspecified psychosis not due to a substance or known physiological condition: Secondary | ICD-10-CM | POA: Insufficient documentation

## 2017-12-12 HISTORY — DX: Bipolar disorder, unspecified: F31.9

## 2017-12-12 LAB — URINALYSIS, REFLEX TO MICROSCOPIC EXAM IF INDICATED
Bilirubin, UA: NEGATIVE
Blood, UA: NEGATIVE
Glucose, UA: NEGATIVE
Ketones UA: 20 — AB
Leukocyte Esterase, UA: NEGATIVE
Nitrite, UA: NEGATIVE
Protein, UR: NEGATIVE
Specific Gravity UA: 1.013 (ref 1.001–1.035)
Urine pH: 6 (ref 5.0–8.0)
Urobilinogen, UA: NORMAL mg/dL

## 2017-12-12 LAB — RAPID DRUG SCREEN, URINE
Barbiturate Screen, UR: NEGATIVE
Benzodiazepine Screen, UR: NEGATIVE
Cannabinoid Screen, UR: NEGATIVE
Cocaine, UR: NEGATIVE
Opiate Screen, UR: NEGATIVE
PCP Screen, UR: NEGATIVE
Urine Amphetamine Screen: NEGATIVE

## 2017-12-12 LAB — CBC AND DIFFERENTIAL
Absolute NRBC: 0 10*3/uL (ref 0.00–0.00)
Basophils Absolute Automated: 0.03 10*3/uL (ref 0.00–0.08)
Basophils Automated: 0.5 %
Eosinophils Absolute Automated: 0.13 10*3/uL (ref 0.00–0.44)
Eosinophils Automated: 2 %
Hematocrit: 35.3 % (ref 34.7–43.7)
Hgb: 11.7 g/dL (ref 11.4–14.8)
Immature Granulocytes Absolute: 0.01 10*3/uL (ref 0.00–0.07)
Immature Granulocytes: 0.2 %
Lymphocytes Absolute Automated: 1.69 10*3/uL (ref 0.42–3.22)
Lymphocytes Automated: 25.4 %
MCH: 28.8 pg (ref 25.1–33.5)
MCHC: 33.1 g/dL (ref 31.5–35.8)
MCV: 86.9 fL (ref 78.0–96.0)
MPV: 9.6 fL (ref 8.9–12.5)
Monocytes Absolute Automated: 0.5 10*3/uL (ref 0.21–0.85)
Monocytes: 7.5 %
Neutrophils Absolute: 4.3 10*3/uL (ref 1.10–6.33)
Neutrophils: 64.4 %
Nucleated RBC: 0 /100 WBC (ref 0.0–0.0)
Platelets: 332 10*3/uL (ref 142–346)
RBC: 4.06 10*6/uL (ref 3.90–5.10)
RDW: 13 % (ref 11–15)
WBC: 6.66 10*3/uL (ref 3.10–9.50)

## 2017-12-12 LAB — COMPREHENSIVE METABOLIC PANEL
ALT: 13 U/L (ref 0–55)
AST (SGOT): 24 U/L (ref 5–34)
Albumin/Globulin Ratio: 1.2 (ref 0.9–2.2)
Albumin: 4.1 g/dL (ref 3.5–5.0)
Alkaline Phosphatase: 63 U/L (ref 37–106)
BUN: 8 mg/dL (ref 7.0–19.0)
Bilirubin, Total: 0.4 mg/dL (ref 0.2–1.2)
CO2: 23 mEq/L (ref 22–29)
Calcium: 9.5 mg/dL (ref 8.5–10.5)
Chloride: 103 mEq/L (ref 100–111)
Creatinine: 0.7 mg/dL (ref 0.6–1.0)
Globulin: 3.5 g/dL (ref 2.0–3.6)
Glucose: 93 mg/dL (ref 70–100)
Potassium: 3.7 mEq/L (ref 3.5–5.1)
Protein, Total: 7.6 g/dL (ref 6.0–8.3)
Sodium: 137 mEq/L (ref 136–145)

## 2017-12-12 LAB — TSH: TSH: 1.63 u[IU]/mL (ref 0.35–4.94)

## 2017-12-12 LAB — POCT PREGNANCY TEST, URINE HCG: POCT Pregnancy HCG Test, UR: NEGATIVE

## 2017-12-12 LAB — PHOSPHORUS: Phosphorus: 3.9 mg/dL (ref 2.3–4.7)

## 2017-12-12 LAB — ACETAMINOPHEN LEVEL: Acetaminophen Level: <7 ug/mL — ABNORMAL LOW (ref 10–30)

## 2017-12-12 LAB — ETHANOL: Alcohol: NOT DETECTED mg/dL

## 2017-12-12 LAB — GFR: EGFR: >60

## 2017-12-12 LAB — SALICYLATE LEVEL: Salicylate Level: <5 mg/dL — ABNORMAL LOW (ref 15.0–30.0)

## 2017-12-12 LAB — MAGNESIUM: Magnesium: 1.9 mg/dL (ref 1.6–2.6)

## 2017-12-12 MED ORDER — HALOPERIDOL LACTATE 5 MG/ML IJ SOLN
5.00 mg | Freq: Once | INTRAMUSCULAR | Status: AC
Start: 2017-12-12 — End: 2017-12-12
  Administered 2017-12-12: 05:00:00 5 mg via INTRAMUSCULAR
  Filled 2017-12-12: qty 1

## 2017-12-12 NOTE — ED Triage Notes (Signed)
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 Denies SI/HI. Hx of bi polar

## 2017-12-12 NOTE — Discharge Instructions (Signed)
Dear Mrs Julie Cervantes:    Thank you for choosing the [facility] Emergency Department, the premier emergency department in the [place] area.  I hope your visit today was EXCELLENT.    Specific instructions for your visit today:    Go directly to the psychiatric hosptial    If you do not continue to improve or your condition worsens, please contact your doctor or return immediately to the Emergency Department.    Sincerely,  Offie Waide, Alben Spittle, MD  Attending Emergency Physician  [facility] Emergency Department    ONSITE PHARMACY  Our full service onsite pharmacy is located in the ER waiting room.  Open 7 days a week from 9 am to 9 pm.  We accept all major insurances and prices are competitive with major retailers.  Ask your provider to print your prescriptions down to the pharmacy to speed you on your way home.    OBTAINING A PRIMARY CARE APPOINTMENT    Primary care physicians (PCPs, also known as primary care doctors) are either internists or family medicine doctors. Both types of PCPs focus on health promotion, disease prevention, patient education and counseling, and treatment of acute and chronic medical conditions.    Call for an appointment with a primary care doctor.  Ask to see who is taking new patients.     [facility]  telephone:  [phone]  [email or web address]    DOCTOR REFERRALS  Call [phone] (available 24 hours a day, 7 days a week) if you need any further referrals and we can help you find a primary care doctor or specialist.  Also, available online at:  [email or web address]    YOUR CONTACT INFORMATION  Before leaving please check with registration to make sure we have an up-to-date contact number.  You can call registration at [phone] to update your information.  For questions about your hospital bill, please call [phone].  For questions about your Emergency Dept Physician bill please call [phone].      FREE HEALTH SERVICES  If you need  help with health or social services, please call 2-1-1 for a free referral to resources in your area.  2-1-1 is a free service connecting people with information on health insurance, free clinics, pregnancy, mental health, dental care, food assistance, housing, and substance abuse counseling.  Also, available online at:  http://www.[place].org    MEDICAL RECORDS AND TESTS  Certain laboratory test results do not come back the same day, for example urine cultures.   We will contact you if other important findings are noted.  Radiology films are often reviewed again to ensure accuracy.  If there is any discrepancy, we will notify you.      Please call [phone] to pick up a complimentary CD of any radiology studies performed.  If you or your doctor would like to request a copy of your medical records, please call [phone].      ORTHOPEDIC INJURY   Please know that significant injuries can exist even when an initial x-ray is read as normal or negative.  This can occur because some fractures (broken bones) are not initially visible on x-rays.  For this reason, close outpatient follow-up with your primary care doctor or bone specialist (orthopedist) is required.    MEDICATIONS AND FOLLOWUP  Please be aware that some prescription medications can cause drowsiness.  Use caution when driving or operating machinery.    The examination and treatment you have received in our Emergency Department is provided on an emergency  basis, and is not intended to be a substitute for your primary care physician.  It is important that your doctor checks you again and that you report any new or remaining problems at that time.      [place]  The nearest 24 hour pharmacy is:    [facility] [place], [place] [address]  [place]  [facility])  Call to start or finish an application, compare plans, enroll or ask a  question.  [place]: [phone]  Web:  Healthcare.gov    Help Enrolling in [place]  510-135-0404 (TOLL-FREE)  [phone] (TTY)  Web:  Http://www.coverva.org    Local Help Enrolling in the [place]  253-357-8534 (MAIN)  Email:  health-help'@nvfs'$ .org  Web:  http://lewis-perez.info/  Address:  [address], [id number] [place], [place] [address]    SEDATING MEDICATIONS  Sedating medications include strong pain medications (e.g. narcotics), muscle relaxers, benzodiazepines (used for anxiety and as muscle relaxers), Benadryl/diphenhydramine and other antihistamines for allergic reactions/itching, and other medications.  If you are unsure if you have received a sedating medication, please ask your physician or nurse.  If you received a sedating medication: DO NOT drive a car. DO NOT operate machinery. DO NOT perform jobs where you need to be alert.  DO NOT drink alcoholic beverages while taking this medicine.     If you get dizzy, sit or lie down at the first signs. Be careful going up and down stairs.  Be extra careful to prevent falls.     Never give this medicine to others.     Keep this medicine out of reach of children.     Do not take or save old medicines. Throw them away when outdated.     Keep all medicines in a cool, dry place. DO NOT keep them in your bathroom medicine cabinet or in a cabinet above the stove.    MEDICATION REFILLS  Please be aware that we cannot refill any prescriptions through the ER. If you need further treatment from what is provided at your ER visit, please follow up with your primary care doctor or your pain management specialist.    [place]  Did you know [facility] has two freestanding ERs located just a few miles away?   ER of [facility] [place] have short wait times, easy free parking directly in front of the building and top patient satisfaction  scores - and the same Board Certified Emergency Medicine doctors as [facility].

## 2017-12-12 NOTE — ED Notes (Signed)
LEO bedside, pt is sitting on stretcher in green gown wearing LEO's metal bracelets and hospital gloves while intermittently making bird sounds, reporting "smelly" pointing to underarms, earlier telling LEO has bug on her side.     LEO escorted pt to bathroom and pt did not void again.

## 2017-12-12 NOTE — ED Notes (Signed)
Pt resting quietly on stretcher with one arm handcuffed

## 2017-12-12 NOTE — ED Notes (Signed)
Called Arna Medici at [facility] @ [phone] at 0510 asking for Dr. Marveen Reeks if we are to I/o cath for urine as pt has not produced with multiple attempts and multiple ways to encourage pt.       Arna Medici states to I/o cath pt for urine as institute will not accept pt w/o tox screen etc.    Dr. Marveen Reeks informed of such, this RN with Judeth Cornfield RN performed I/O cath, pt placed on stretcher supine, LEO changed right handcuff position and added left handcuff.    Pt tolerated procedure well with assistance of Engineering geologist.       CSB called back to inform urine collected and awaiting results.    After I/o cath, pt's left handcuff was removed, pt in position of comfort laying on stretcher on right side with side rails up, pt requested blanket.   Pt given warm blanket and lights lowered, LEO remains bedside.

## 2017-12-12 NOTE — ED Notes (Signed)
Pt making sounds like "oooooooouuuuuuuweeeeeeeeeeeeeeeeeeee"

## 2017-12-12 NOTE — ED Notes (Signed)
Pt sat on bedside commode stating does not have to void, while in green gown and sitting on commode and handcuffed to stretcher, pt helped RN raise right sleeve up, held arm still while RN wiped deltoid with alcohol pad, pt was calm and cooperative during admin

## 2017-12-12 NOTE — Progress Notes (Signed)
6:39 AM  Writer faxed pt medical clearance to Arna Medici at [employer]. Per Arna Medici, pt will be going to [facility], but she does not have accepting information yet. ED updated.

## 2017-12-12 NOTE — ED Notes (Signed)
Terry from [facility] called and reports:  - pt accepted to [facility]   - report to [phone]  - accepting Dr Christell Constant    Per [employer] Officer [place]ett, daytime officer will pickup TDO and then come get pt

## 2017-12-12 NOTE — ED Notes (Signed)
[employer] officer Basil Dess is now bedside with TDO, [facility] just called to clarify pt will go to [facility] but does not have accepting MD yet.    Officer aware      Pt sleeping on stretcher

## 2017-12-12 NOTE — ED Notes (Signed)
Gladys from registration remains with pt asking every question possible, including but not limited to:  - Do you work?   Pt "everyday"  - What company?  Pt "every companyDietitian remained with pt for 25+minutes.     LEO remains bedside

## 2017-12-12 NOTE — ED Provider Notes (Signed)
[facility] EMERGENCY DEPARTMENT H&P      Visit date: 12/12/2017      CLINICAL SUMMARY           Diagnosis:    .     Final diagnoses:   Psychosis, unspecified psychosis type         MDM Notes:      Brought in as ECO  Pt with depression, tangential, intermittently agitated  Medically cleared  [facility] aware  Accepted for transfer to psych hospital          Disposition:         Transfer to Psychiatric hosptial                CLINICAL INFORMATION        HPI:      Chief Complaint: Medical clearance  .    Julie Cervantes is a 47 y.o. female with h/o bipolar disorder and depression who presents under TDO for medical clearance. She states she has problems with her brother and sister. She denies history of bipolar or depression.    Denies SI, HI.     History obtained from: Patient  Caveat: HPI caveat invoked due to patient's mental status.        ROS:      Caveat: Unable to complete ROS due to patient mental status      Physical Exam:      Pulse 98  BP 144/80  Resp 16  SpO2 98 %  Temp (!) 96.9 F (36.1 C)    Physical Exam   Constitutional: She is oriented to person, place, and time. She appears well-developed and well-nourished. No distress.   HENT:   Head: Normocephalic and atraumatic.   Mouth/Throat: Oropharynx is clear and moist.   Eyes: Pupils are equal, round, and reactive to light. Conjunctivae are normal. No scleral icterus.   Neck: Normal range of motion. Neck supple.   Cardiovascular: Normal rate, regular rhythm and normal heart sounds.  Exam reveals no gallop and no friction rub.    No murmur heard.  Pulmonary/Chest: Effort normal and breath sounds normal. No respiratory distress. She has no wheezes. She has no rales.   Abdominal: Soft. Bowel sounds are normal. She exhibits no distension. There is no tenderness. There is no rebound and no guarding.   Musculoskeletal: Normal range of motion. She exhibits no edema or deformity.   Neurological: She is alert and oriented to  person, place, and time. No cranial nerve deficit.   Skin: Skin is warm and dry. No rash noted.   Psychiatric:   Psychotic with poor insight.   Nursing note and vitals reviewed.               PAST HISTORY        Primary Care Provider: Christa See, MD        PMH/PSH:    .     Past Medical History:   Diagnosis Date   . Bipolar 1 disorder        She has no past surgical history on file.      Social/Family History:      She reports that she has never smoked. She has never used smokeless tobacco. She reports that she does not drink alcohol or use drugs.    History reviewed. No pertinent family history.      Listed Medications on Arrival:    .     Home Medications     Med List Status:  In Progress Set By: Domenic Schwab, RN at 12/12/2017  2:28 AM                escitalopram (LEXAPRO) 10 MG tablet     Take 10 mg by mouth daily         Allergies: She has No Known Allergies.            VISIT INFORMATION        Clinical Course in the ED:                 Medications Given in the ED:    .     ED Medication Orders     Start Ordered     Status Ordering Provider    12/12/17 [phone] 12/12/17 0427  haloperidol lactate (HALDOL) injection 5 mg  Once     Route: Intramuscular  Ordered Dose: 5 mg     Last MAR action:  Given Mahamed Zalewski E            Procedures:      Procedures      Interpretations:      O2 sat-           saturation: 98 %; Oxygen use: room air; Interpretation: Normal      EKG -             interpreted by me: normal sinus at 86.  Normal axis, no ectopy, no acute ST or T wave changes.      Monitor -         interpreted by me: sinus 80s              RESULTS        Lab Results:      Results     Procedure Component Value Units Date/Time    Urine Rapid Drug Screen [[id number]] Collected:  12/12/17 0525    Specimen:  Urine Updated:  12/12/17 8756     Amphetamine Screen, UR Negative     Barbiturate Screen, UR Negative     Benzodiazepine Screen, UR Negative     Cannabinoid Screen, UR Negative     Cocaine, UR Negative      Opiate Screen, UR Negative     PCP Screen, UR Negative    UA, Reflex to Microscopic (All hospital ED's and [facility], pts 3+ yrs) [[id number]]  (Abnormal) Collected:  12/12/17 0525    Specimen:  Urine Updated:  12/12/17 4166     Urine Type Clean Catch     Color, UA Yellow     Clarity, UA Hazy     Specific Gravity UA 1.013     Urine pH 6.0     Leukocyte Esterase, UA Negative     Nitrite, UA Negative     Protein, UR Negative     Glucose, UA Negative     Ketones UA 20 (A)     Urobilinogen, UA Normal mg/dL      Bilirubin, UA Negative     Blood, UA Negative    Urine HCG POC ([facility] Only) [[id number]] Collected:  12/12/17 0532     Updated:  12/12/17 0542     POCT QC Pass     POCT Pregnancy HCG Test, UR Negative     Comment: Negative Value is Normal in Healthy Males or Healthy non-pregnant Females    TSH [[id number]] Collected:  12/12/17 0310    Specimen:  Blood Updated:  12/12/17 0407     TSH 1.63 uIU/mL  ASA  level [[id number]]  (Abnormal) Collected:  12/12/17 0310    Specimen:  Blood Updated:  12/12/17 0344     Salicylate Level <5.0 (L) mg/dL     GFR [[id number]] Collected:  12/12/17 0310     Updated:  12/12/17 0344     EGFR >60.0    Comprehensive metabolic panel (CMP) [[id number]] Collected:  12/12/17 0310    Specimen:  Blood Updated:  12/12/17 0344     Glucose 93 mg/dL      BUN 8.0 mg/dL      Creatinine 0.7 mg/dL      Sodium 244 mEq/L      Potassium 3.7 mEq/L      Chloride 103 mEq/L      CO2 23 mEq/L      Calcium 9.5 mg/dL      Protein, Total 7.6 g/dL      Albumin 4.1 g/dL      AST (SGOT) 24 U/L      ALT 13 U/L      Alkaline Phosphatase 63 U/L      Bilirubin, Total 0.4 mg/dL      Globulin 3.5 g/dL      Albumin/Globulin Ratio 1.2    Magnesium [[id number]] Collected:  12/12/17 0310    Specimen:  Blood Updated:  12/12/17 0344     Magnesium 1.9 mg/dL     Phosphorus [[id number]] Collected:  12/12/17 0310    Specimen:  Blood Updated:  12/12/17 0344     Phosphorus 3.9 mg/dL     Alcohol (Ethanol)  Level [[id number]]  Collected:  12/12/17 0310    Specimen:  Blood Updated:  12/12/17 0344     Alcohol None Detected mg/dL     Acetaminophen level [[id number]]  (Abnormal) Collected:  12/12/17 0310    Specimen:  Blood Updated:  12/12/17 0344     Acetaminophen Level <7 (L) ug/mL     CBC with differential [[id number]] Collected:  12/12/17 0310    Specimen:  Blood from Blood Updated:  12/12/17 0331     WBC 6.66 x10 3/uL      Hgb 11.7 g/dL      Hematocrit 60.1 %      Platelets 332 x10 3/uL      RBC 4.06 x10 6/uL      MCV 86.9 fL      MCH 28.8 pg      MCHC 33.1 g/dL      RDW 13 %      MPV 9.6 fL      Neutrophils 64.4 %      Lymphocytes Automated 25.4 %      Monocytes 7.5 %      Eosinophils Automated 2.0 %      Basophils Automated 0.5 %      Immature Granulocyte 0.2 %      Nucleated RBC 0.0 /100 WBC      Neutrophils Absolute 4.30 x10 3/uL      Abs Lymph Automated 1.69 x10 3/uL      Abs Mono Automated 0.50 x10 3/uL      Abs Eos Automated 0.13 x10 3/uL      Absolute Baso Automated 0.03 x10 3/uL      Absolute Immature Granulocyte 0.01 x10 3/uL      Absolute NRBC 0.00 x10 3/uL               Radiology Results:      No orders to display  Scribe Attestation:      I was acting as a Neurosurgeon for [employer], MD on [place],Maxi  Treatment Team: Scribe: Hosie Poisson    I am the first provider for this patient and I personally performed the services documented. Treatment Team: Scribe: Hosie Poisson is scribing for me on [place]. This note and the patient instructions accurately reflect work and decisions made by me.  Randolph Bing, MD          Randolph Bing, MD  12/14/17 [phone]

## 2017-12-13 LAB — ECG 12-LEAD
Atrial Rate: 86 {beats}/min
P Axis: 53 degrees
P-R Interval: 142 ms
Q-T Interval: 380 ms
QRS Duration: 80 ms
QTC Calculation (Bezet): 454 ms
R Axis: 24 degrees
T Axis: 27 degrees
Ventricular Rate: 86 {beats}/min

## 2022-06-09 ENCOUNTER — Ambulatory Visit (HOSPITAL_COMMUNITY)
Admission: EM | Admit: 2022-06-09 | Discharge: 2022-06-09 | Disposition: A | Discharge status: Home / Self Care | Payer: Commercial Managed Care - HMO | Attending: Family Medicine | Admitting: Family Medicine

## 2022-06-09 ENCOUNTER — Encounter (HOSPITAL_COMMUNITY): Payer: Self-pay | Admitting: Emergency Medicine

## 2022-06-09 DIAGNOSIS — R000 Tachycardia, unspecified: Secondary | ICD-10-CM | POA: Diagnosis not present

## 2022-06-09 DIAGNOSIS — R4689 Other symptoms and signs involving appearance and behavior: Secondary | ICD-10-CM

## 2022-06-09 MED ORDER — ESCITALOPRAM OXALATE 20 MG PO TABS: 20.0000 mg | ORAL_TABLET | Freq: Every day | ORAL | 1 refills | Status: DC

## 2022-06-09 MED ORDER — ARIPIPRAZOLE 5 MG PO TABS: 5.0000 mg | ORAL_TABLET | Freq: Every day | ORAL | 1 refills | Status: DC

## 2022-06-09 NOTE — ED Triage Notes (Signed)
Pt's spouse reports that patient hasnt been eating for about 5 days. No nausea or vomiting just not eating. Adds that she has been confused as well. Repots hx schizophrenia and "sometimes she talks and sometimes she wont". Was on medications (aripiprazole 5mg  and escitalopram 20 mg but out for past 10 days.  No longer seeing a PCP.

## 2022-06-09 NOTE — Medical Student Note (Signed)
[facility] Statistician Note For educational purposes for Medical, PA and NP students only and not part of the legal medical record.   CSN: [id number] Arrival date & time: 06/09/22  1145      History   Chief Complaint Chief Complaint  Patient presents with   Anorexia   Altered Mental Status    HPI Alyssa Avila is a 51 y.o. female.  Patient arrives to clinic today after being unable to refill her aripiprazole and escitalopram prescriptions and abruptly stopping them 10 days ago. Husband states they've tried calling the [place] office who prescribed her medications, but that they never pick up the phone. Husband states they haven't been back to the office to be seen in "a long time," and that they just continued to go to the pharmacy and get refills of her medications. Since stopping her prescriptions, patient has developed an increasingly flat affect, decreasing appetite, and had an increase in proclaiming negative/depressive thoughts per her husband. Patient states she's not having any thoughts of wanting to hurt herself or anyone else. Patient is alert, responsive to conversation, answers questions appropriately. Patient is tachycardic on exam, states she does not feel like her heart is racing. Denies any fever, pain, chest pain, palpitations, cough, wheezing, SOB, abdominal pain, bowel or bladder habit changes, numbness, tingling, weakness. Patient and husband deny any changes to environment, diet, any recent/new stressing factors. Patient and husband declined interpreting services for duration of visit.  The history is provided by the patient and the spouse.  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 No pertinent surgical history.  OB History   No obstetric history on file.      Home Medications    Prior to Admission medications   Medication Sig Start Date End Date Taking? Authorizing Provider  ARIPiprazole (ABILIFY) 5 MG tablet Take 1 tablet (5 mg total) by mouth daily. 06/09/22  Yes Vanessa Kick, MD  conjugated estrogens (PREMARIN) vaginal cream Apply small amt ext at hs mwf each week Patient not taking: Reported on 11/11/2016 08/24/16   Billy Fischer, MD  escitalopram (LEXAPRO) 20 MG tablet Take 1 tablet (20 mg total) by mouth daily. 06/09/22   Vanessa Kick, MD  Melatonin 5 MG TABS Take 1 tablet (5 mg total) by mouth at bedtime. Patient not taking: Reported on 11/14/2017 11/11/16   Clent Demark, PA-C  meloxicam (MOBIC) 7.5 MG tablet Take 1 tablet (7.5 mg total) by mouth daily. Patient not taking: Reported on 11/14/2017 03/25/16   Janne Napoleon, NP  OLANZapine (ZYPREXA) 15 MG tablet Take 1 tablet (15 mg total) by mouth at bedtime. Patient not taking: Reported on 11/14/2017 03/09/16   Patrecia Pour, NP  traZODone (DESYREL) 150 MG tablet Take 1 tablet (150 mg total) by mouth at bedtime. Patient not taking: Reported on 11/14/2017 03/09/16   Patrecia Pour, NP  zolpidem (AMBIEN) 5 MG tablet Take 1 tablet (5 mg total) by mouth at bedtime  as needed for sleep. Patient not taking: Reported on 11/14/2017 11/13/17   Azalia Bilis, MD    Family History No family history on file.  Social History Social History   Tobacco Use   Smoking status: Never   Smokeless tobacco: Never  Substance Use Topics   Alcohol use: No    Alcohol/week: 0.0 standard drinks of alcohol   Drug use: No     Allergies   Patient  has no known allergies.   Review of Systems Review of Systems   Physical Exam Updated Vital Signs BP (!) 143/98 (BP Location: Left Arm)   Pulse (!) 115   Temp 98.5 F (36.9 C) (Oral)   Resp 17   SpO2 95%   Physical Exam   ED Treatments / Results  Labs (all labs ordered are listed, but only abnormal results are displayed) Labs Reviewed - No data to display  EKG  Radiology No results found.  Procedures Procedures (including critical care time)  Medications Ordered in ED Medications - No data to display   Initial Impression / Assessment and Plan / ED Course  I have reviewed the triage vital signs and the nursing notes.  Pertinent labs & imaging results that were available during my care of the patient were reviewed by me and considered in my medical decision making (see chart for details).     Patient's symptoms are likely related to abrupt withdrawal from psychiatric medications aripiprazole and escitalopram. Will refill those medications and provide information for future psychiatric follow up. EKG did show tachycardia, patient is asymptomatic and not interested in medical therapies for treatment of this at this time. Husband appears to be a reliable historian and caregiver. Instructions given to follow up in the emergency department for new or worsening physical symptoms, or behavioral health emergency department for worsening psychiatric symptoms.   Final Clinical Impressions(s) / ED Diagnoses   Final diagnoses:  Behavioral change  Tachycardia    New Prescriptions New Prescriptions   ARIPIPRAZOLE (ABILIFY) 5 MG TABLET    Take 1 tablet (5 mg total) by mouth daily.

## 2022-06-09 NOTE — ED Provider Notes (Addendum)
[facility]   [id number] 06/09/22 Arrival Time: 1145  ASSESSMENT & PLAN:  1. Behavioral change   2. Tachycardia    Exacerbation of depressive symptoms.  Recommend:  Follow-up Information     Schedule an appointment as soon as possible for a visit  with [facility].   Specialty: Urgent Care Contact information: [address] [place] [address] [phone]        [facility] EMERGENCY DEPARTMENT.   Specialty: Emergency Medicine Why: If symptoms worsen in any way. Contact information: [id number] [place] [address] [phone]                Meds ordered this encounter  Medications   escitalopram (LEXAPRO) 20 MG tablet    Sig: Take 1 tablet (20 mg total) by mouth daily.    Dispense:  30 tablet    Refill:  1   ARIPiprazole (ABILIFY) 5 MG tablet    Sig: Take 1 tablet (5 mg total) by mouth daily.    Dispense:  30 tablet    Refill:  1   Patient's symptoms are likely related to abrupt withdrawal from psychiatric medications aripiprazole and escitalopram. Will refill those medications and provide information for future psychiatric follow up. EKG did show tachycardia, patient is asymptomatic and not interested in medical therapies for treatment of this at this time. Husband appears to be a reliable historian and caregiver. Instructions given to follow up in the emergency department for new or worsening physical symptoms, or behavioral health emergency department for worsening psychiatric symptoms.   Reviewed expectations re: course of current medical issues. Questions answered. Outlined signs and symptoms indicating need for more acute intervention. Patient verbalized understanding. After Visit Summary given.   SUBJECTIVE: History from: patient and caregiver. Alyssa Avila is a 51 y.o. female. Seen with Donetta Potts NP student. Agree fully with  her assessment. Patient arrives to clinic today after being unable to refill her aripiprazole and escitalopram prescriptions and abruptly stopping them 10 days ago. Husband states they've tried calling the [facility] [place] office who prescribed her medications, but that they never pick up the phone. Husband states they haven't been back to the office to be seen in "a long time," and that they just continued to go to the pharmacy and get refills of her medications. Since stopping her prescriptions, patient has developed an increasingly flat affect, decreasing appetite, and had an increase in proclaiming negative/depressive thoughts per her husband. Patient states she's not having any thoughts of wanting to hurt herself or anyone else. Patient is alert, responsive to conversation, answers questions appropriately. Patient is tachycardic on exam, states she does not feel like her heart is racing. Denies any fever, pain, chest pain, palpitations, cough, wheezing, SOB, abdominal pain, bowel or bladder habit changes, numbness, tingling, weakness. Patient and husband deny any changes to environment, diet, any recent/new stressing factors. Patient and husband declined interpreting services for duration of visit.  Past Medical History:  Diagnosis Date   Depression    Schizophrenia (HCC)     OBJECTIVE:  Vitals:   06/09/22 1311  BP: (!) 143/98  Pulse: (!) 115  Resp: 17  Temp: 98.5 F (36.9 C)  TempSrc: Oral  SpO2: 95%    Tachycardia noted. General appearance: alert; no distress Eyes: PERRLA; EOMI; conjunctiva normal HENT: normocephalic; atraumatic Neck: supple  Lungs: clear to auscultation bilaterally Heart: regular Extremities: no cyanosis or edema; symmetrical with no gross deformities  Skin: warm and dry Neurologic: normal gait; normal symmetric reflexes Psychological: alert and cooperative; normal mood and affect   No Known Allergies  Social History   Socioeconomic History   Marital status:  Married    Spouse name: Not on file   Number of children: Not on file   Years of education: Not on file   Highest education level: Not on file  Occupational History   Not on file  Tobacco Use   Smoking status: Never   Smokeless tobacco: Never  Substance and Sexual Activity   Alcohol use: No    Alcohol/week: 0.0 standard drinks of alcohol   Drug use: No   Sexual activity: Not on file  Other Topics Concern   Not on file  Social History Narrative   Not on file   Social Determinants of Health   Financial Resource Strain: Not on file  Food Insecurity: Not on file  Transportation Needs: Not on file  Physical Activity: Not on file  Stress: Not on file  Social Connections: Not on file  Intimate Partner Violence: Not on file   No family history on file. History reviewed. No pertinent surgical history.    Vanessa Kick, MD 06/09/22 1531    Vanessa Kick, MD 06/09/22 [phone]

## 2022-12-22 DIAGNOSIS — Z419 Encounter for procedure for purposes other than remedying health state, unspecified: Secondary | ICD-10-CM | POA: Diagnosis not present

## 2023-01-22 DIAGNOSIS — Z419 Encounter for procedure for purposes other than remedying health state, unspecified: Secondary | ICD-10-CM | POA: Diagnosis not present

## 2023-02-21 DIAGNOSIS — Z419 Encounter for procedure for purposes other than remedying health state, unspecified: Secondary | ICD-10-CM | POA: Diagnosis not present

## 2023-03-24 DIAGNOSIS — Z419 Encounter for procedure for purposes other than remedying health state, unspecified: Secondary | ICD-10-CM | POA: Diagnosis not present

## 2023-04-24 DIAGNOSIS — Z419 Encounter for procedure for purposes other than remedying health state, unspecified: Secondary | ICD-10-CM | POA: Diagnosis not present

## 2023-05-24 DIAGNOSIS — Z419 Encounter for procedure for purposes other than remedying health state, unspecified: Secondary | ICD-10-CM | POA: Diagnosis not present

## 2023-06-24 DIAGNOSIS — Z419 Encounter for procedure for purposes other than remedying health state, unspecified: Secondary | ICD-10-CM | POA: Diagnosis not present

## 2023-07-24 DIAGNOSIS — Z419 Encounter for procedure for purposes other than remedying health state, unspecified: Secondary | ICD-10-CM | POA: Diagnosis not present

## 2023-08-24 DIAGNOSIS — Z419 Encounter for procedure for purposes other than remedying health state, unspecified: Secondary | ICD-10-CM | POA: Diagnosis not present

## 2023-08-29 DIAGNOSIS — Z1231 Encounter for screening mammogram for malignant neoplasm of breast: Secondary | ICD-10-CM | POA: Diagnosis not present

## 2023-09-24 DIAGNOSIS — Z419 Encounter for procedure for purposes other than remedying health state, unspecified: Secondary | ICD-10-CM | POA: Diagnosis not present

## 2023-10-22 DIAGNOSIS — Z419 Encounter for procedure for purposes other than remedying health state, unspecified: Secondary | ICD-10-CM | POA: Diagnosis not present

## 2023-12-03 DIAGNOSIS — Z419 Encounter for procedure for purposes other than remedying health state, unspecified: Secondary | ICD-10-CM | POA: Diagnosis not present

## 2024-01-02 DIAGNOSIS — Z419 Encounter for procedure for purposes other than remedying health state, unspecified: Secondary | ICD-10-CM | POA: Diagnosis not present

## 2024-02-02 DIAGNOSIS — Z419 Encounter for procedure for purposes other than remedying health state, unspecified: Secondary | ICD-10-CM | POA: Diagnosis not present

## 2024-03-03 DIAGNOSIS — Z419 Encounter for procedure for purposes other than remedying health state, unspecified: Secondary | ICD-10-CM | POA: Diagnosis not present

## 2024-04-03 DIAGNOSIS — Z419 Encounter for procedure for purposes other than remedying health state, unspecified: Secondary | ICD-10-CM | POA: Diagnosis not present

## 2024-05-04 ENCOUNTER — Ambulatory Visit (HOSPITAL_COMMUNITY): Payer: Self-pay

## 2024-05-04 ENCOUNTER — Ambulatory Visit (HOSPITAL_COMMUNITY)
Admission: EM | Admit: 2024-05-04 | Discharge: 2024-05-04 | Disposition: A | Discharge status: Home / Self Care | Attending: Family Medicine | Admitting: Family Medicine

## 2024-05-04 ENCOUNTER — Encounter (HOSPITAL_COMMUNITY): Payer: Self-pay

## 2024-05-04 DIAGNOSIS — Z419 Encounter for procedure for purposes other than remedying health state, unspecified: Secondary | ICD-10-CM | POA: Diagnosis not present

## 2024-05-04 DIAGNOSIS — F32A Depression, unspecified: Secondary | ICD-10-CM

## 2024-05-04 DIAGNOSIS — R531 Weakness: Secondary | ICD-10-CM | POA: Diagnosis not present

## 2024-05-04 DIAGNOSIS — K219 Gastro-esophageal reflux disease without esophagitis: Secondary | ICD-10-CM | POA: Diagnosis not present

## 2024-05-04 DIAGNOSIS — R5383 Other fatigue: Secondary | ICD-10-CM | POA: Diagnosis not present

## 2024-05-04 LAB — COMPREHENSIVE METABOLIC PANEL WITH GFR
ALT: 11 U/L (ref 0–44)
AST: 16 U/L (ref 15–41)
Albumin: 3.8 g/dL (ref 3.5–5.0)
Alkaline Phosphatase: 42 U/L (ref 38–126)
Anion gap: 11 (ref 5–15)
BUN: 9 mg/dL (ref 6–20)
CO2: 23 mmol/L (ref 22–32)
Calcium: 9.3 mg/dL (ref 8.9–10.3)
Chloride: 106 mmol/L (ref 98–111)
Creatinine, Ser: 0.66 mg/dL (ref 0.44–1.00)
GFR, Estimated: >60 mL/min (ref >60)
Glucose, Bld: 87 mg/dL (ref 70–99)
Potassium: 4.3 mmol/L (ref 3.5–5.1)
Sodium: 140 mmol/L (ref 135–145)
Total Bilirubin: 0.5 mg/dL (ref 0.0–1.2)
Total Protein: 6.8 g/dL (ref 6.5–8.1)

## 2024-05-04 LAB — CBC
HCT: 39.7 % (ref 36.0–46.0)
Hemoglobin: 13 g/dL (ref 12.0–15.0)
MCH: 27.9 pg (ref 26.0–34.0)
MCHC: 32.7 g/dL (ref 30.0–36.0)
MCV: 85.2 fL (ref 80.0–100.0)
Platelets: 332 K/uL (ref 150–400)
RBC: 4.66 MIL/uL (ref 3.87–5.11)
RDW: 12.9 % (ref 11.5–15.5)
WBC: 4.4 K/uL (ref 4.0–10.5)
nRBC: 0 % (ref 0.0–0.2)

## 2024-05-04 LAB — TSH: TSH: 1.849 u[IU]/mL (ref 0.350–4.500)

## 2024-05-04 MED ORDER — ARIPIPRAZOLE 5 MG PO TABS: 5.0000 mg | ORAL_TABLET | Freq: Every evening | ORAL | 1 refills | Status: DC

## 2024-05-04 MED ORDER — FAMOTIDINE 20 MG PO TABS: 20.0000 mg | ORAL_TABLET | Freq: Two times a day (BID) | ORAL | 0 refills | Status: DC

## 2024-05-04 NOTE — Discharge Instructions (Addendum)
 Restart your Abilify  5 mg--1 nightly.  Take famotidine  20 mg--1 tablet 2 times daily.  This is for stomach acid and acid reflux   We have drawn blood to check blood counts, sugar and kidney and liver function, and thyroid  function.  Staff will notify you if there is anything significantly abnormal  Please consider going to the Behavioral Health urgent care for further treatment and evaluation  Keep your appointment with the new primary care provider

## 2024-05-04 NOTE — ED Triage Notes (Signed)
 Patient here today with c/o weakness, loss of appetite, backache, and difficulty sleeping at night X 2 weeks. Patient has been on medications for sleep in the past but hasn't taken them in years. Patient states that she has chest pain sometimes. Denies SOB.

## 2024-05-04 NOTE — ED Provider Notes (Signed)
 [facility]    CSN: [id number] Arrival date & time: 05/04/24  9066      History   Chief Complaint Chief Complaint  Patient presents with   Weakness    HPI Alyssa Avila is a 53 y.o. female.    Weakness Here for feeling tired and weak for 1 to 2 weeks.  She states she has not been sleeping well.  She has not been on her Abilify  for some time.  No recent fever or chills or upper respiratory symptoms.  No nausea or vomiting or diarrhea.  She does have some mild burning in her upper abdomen sometimes.  She does admit to some depression.  She denies any suicidal thoughts or intent to harm herself.  NKDA  She has just made an appointment for a new primary care and that appointment is coming up in October. She used to see primary care and behavioral health in [facility], but she lives here in [place].  She is perimenopausal  Past Medical History:  Diagnosis Date   Depression    Schizophrenia [facility])     Patient Active Problem List   Diagnosis Date Noted   MDD (major depressive disorder), recurrent, with catatonic features (HCC) 12/09/2015   Depression, major, recurrent, severe with psychosis (HCC) 12/09/2015   Right shoulder pain 06/18/2015   Frozen shoulder syndrome 03/19/2015    History reviewed. No pertinent surgical history.  OB History   No obstetric history on file.      Home Medications    Prior to Admission medications   Medication Sig Start Date End Date Taking? Authorizing Provider  famotidine  (PEPCID ) 20 MG tablet Take 1 tablet (20 mg total) by mouth 2 (two) times daily. 05/04/24  Yes Vonna Sharlet POUR, MD  ARIPiprazole  (ABILIFY ) 5 MG tablet Take 1 tablet (5 mg total) by mouth at bedtime. 05/04/24   Vonna Sharlet POUR, MD  escitalopram  (LEXAPRO ) 20 MG tablet Take 1 tablet (20 mg total) by mouth daily. Patient not taking: Reported on 05/04/2024 06/09/22   Rolinda Rogue, MD    Family History History reviewed. No pertinent family  history.  Social History Social History   Tobacco Use   Smoking status: Never   Smokeless tobacco: Never  Substance Use Topics   Alcohol use: No    Alcohol/week: 0.0 standard drinks of alcohol   Drug use: No     Allergies   Patient has no known allergies.   Review of Systems Review of Systems  Neurological:  Positive for weakness.     Physical Exam Triage Vital Signs ED Triage Vitals  Encounter Vitals Group     BP 05/04/24 1037 120/84     Girls Systolic BP Percentile --      Girls Diastolic BP Percentile --      Boys Systolic BP Percentile --      Boys Diastolic BP Percentile --      Pulse Rate 05/04/24 1037 88     Resp 05/04/24 1037 16     Temp 05/04/24 1037 98 F (36.7 C)     Temp Source 05/04/24 1037 Oral     SpO2 05/04/24 1037 95 %     Weight --      Height --      Head Circumference --      Peak Flow --      Pain Score 05/04/24 1038 6     Pain Loc --      Pain Education --  Exclude from Growth Chart --    No data found.  Updated Vital Signs BP 120/84 (BP Location: Left Arm)   Pulse 88   Temp 98 F (36.7 C) (Oral)   Resp 16   LMP  (LMP Unknown)   SpO2 95%   Visual Acuity Right Eye Distance:   Left Eye Distance:   Bilateral Distance:    Right Eye Near:   Left Eye Near:    Bilateral Near:     Physical Exam Vitals reviewed.  Constitutional:      General: She is not in acute distress.    Appearance: She is not ill-appearing, toxic-appearing or diaphoretic.  HENT:     Mouth/Throat:     Mouth: Mucous membranes are moist.  Eyes:     Extraocular Movements: Extraocular movements intact.     Conjunctiva/sclera: Conjunctivae normal.     Pupils: Pupils are equal, round, and reactive to light.  Cardiovascular:     Rate and Rhythm: Normal rate and regular rhythm.     Heart sounds: No murmur heard. Pulmonary:     Effort: Pulmonary effort is normal.     Breath sounds: Normal breath sounds.  Abdominal:     General: There is no distension.      Palpations: Abdomen is soft.     Tenderness: There is no abdominal tenderness. There is no guarding.  Musculoskeletal:     Cervical back: Neck supple.  Lymphadenopathy:     Cervical: No cervical adenopathy.  Skin:    Coloration: Skin is not jaundiced or pale.  Neurological:     General: No focal deficit present.     Mental Status: She is alert and oriented to person, place, and time.  Psychiatric:        Behavior: Behavior normal.      UC Treatments / Results  Labs (all labs ordered are listed, but only abnormal results are displayed) Labs Reviewed  CBC  COMPREHENSIVE METABOLIC PANEL WITH GFR  TSH    EKG   Radiology No results found.  Procedures Procedures (including critical care time)  Medications Ordered in UC Medications - No data to display  Initial Impression / Assessment and Plan / UC Course  I have reviewed the triage vital signs and the nursing notes.  Pertinent labs & imaging results that were available during my care of the patient were reviewed by me and considered in my medical decision making (see chart for details).     I am going to restart her Abilify  since she found that the most helpful of her medications.  She denies suicidal thoughts or intentions at this time, so I do not think she has to go on an urgent basis to be evaluated for that.  She is given contact information for the behavioral health urgent care. Encouragement is given for her to continue with her primary care appointment she already has set up for October.  CBC and CMP and TSH are drawn today to assess her symptoms.  Staff will notify her of anything significantly abnormal  She may have some reflux also going on, so Pepcid  is sent in to address those symptoms.  Final Clinical Impressions(s) / UC Diagnoses   Final diagnoses:  Fatigue, unspecified type  Weakness  Depression, unspecified depression type  Gastroesophageal reflux disease, unspecified whether esophagitis  present     Discharge Instructions      Restart your Abilify  5 mg--1 nightly.  Take famotidine  20 mg--1 tablet 2 times daily.  This is  for stomach acid and acid reflux   We have drawn blood to check blood counts, sugar and kidney and liver function, and thyroid  function.  Staff will notify you if there is anything significantly abnormal  Please consider going to the Behavioral Health urgent care for further treatment and evaluation  Keep your appointment with the new primary care provider       ED Prescriptions     Medication Sig Dispense Auth. Provider   ARIPiprazole  (ABILIFY ) 5 MG tablet Take 1 tablet (5 mg total) by mouth at bedtime. 30 tablet Juliette Standre K, MD   famotidine  (PEPCID ) 20 MG tablet Take 1 tablet (20 mg total) by mouth 2 (two) times daily. 60 tablet Raydell Maners, Sharlet POUR, MD      PDMP not reviewed this encounter.   Vonna Sharlet POUR, MD 05/04/24 1124

## 2024-06-11 ENCOUNTER — Ambulatory Visit: Payer: Self-pay | Admitting: Family Medicine

## 2024-06-11 ENCOUNTER — Encounter: Payer: Self-pay | Admitting: Family Medicine

## 2024-06-11 VITALS — BP 132/80 | HR 69 | Ht 62.0 in | Wt 151.8 lb

## 2024-06-11 DIAGNOSIS — F411 Generalized anxiety disorder: Secondary | ICD-10-CM

## 2024-06-11 DIAGNOSIS — Z23 Encounter for immunization: Secondary | ICD-10-CM | POA: Diagnosis not present

## 2024-06-11 DIAGNOSIS — F5101 Primary insomnia: Secondary | ICD-10-CM | POA: Diagnosis not present

## 2024-06-11 MED ORDER — TRAZODONE HCL 50 MG PO TABS: 25.0000 mg | ORAL_TABLET | Freq: Every evening | ORAL | 3 refills | Status: AC | PRN

## 2024-06-11 MED ORDER — ESCITALOPRAM OXALATE 5 MG PO TABS: 10.0000 mg | ORAL_TABLET | Freq: Every day | ORAL | 1 refills | Status: AC

## 2024-06-11 NOTE — Progress Notes (Unsigned)
   Name: Alyssa Avila   Date of Visit: 06/11/24   Date of last visit with me: Visit date not found   CHIEF COMPLAINT:  Chief Complaint  Patient presents with   Establish Care    New patient. Just wants a check up.        HPI:  Discussed the use of AI scribe software for clinical note transcription with the patient, who gave verbal consent to proceed.  History of Present Illness   Alyssa Avila is a 53 year old female with generalized anxiety disorder who presents with sleep disturbances.  She experiences difficulty sleeping, characterized by short naps and frequent awakenings throughout the night, often waking up in a panic. This issue has been ongoing for an unspecified duration, although she mentions a few nights of good sleep recently. She wakes up every hour, which significantly impacts her rest.  She reports a history of anxiety and has previously been treated with Lexapro  and Abilify . She discontinued Lexapro  due to side effects such as decreased mental potential and increased appetite, which led to weight gain. She has been off Lexapro  for a long time and currently only takes Abilify , which she associates with weight gain.  She feels anxious about various aspects of her life, which affects her sleep and daily activities. She experiences stress and anger, attributing these feelings to her anxiety disorder. She reports she has not taken Lexapro  for a long time but continues to take Abilify . No current use of Lexapro  but continues to take Abilify .         OBJECTIVE:       06/11/2024    3:28 PM  Depression screen PHQ 2/9  Decreased Interest 0  Down, Depressed, Hopeless 0  PHQ - 2 Score 0     BP Readings from Last 3 Encounters:  06/11/24 132/80  05/04/24 120/84  06/09/22 (!) 143/98    BP 132/80   Pulse 69   Ht 5' 2 (1.575 m)   Wt 151 lb 12.8 oz (68.9 kg)   SpO2 98%   BMI 27.76 kg/m    Physical Exam          Physical Exam Constitutional:       Appearance: Normal appearance.  Neurological:     General: No focal deficit present.     Mental Status: She is alert and oriented to person, place, and time. Mental status is at baseline.     ASSESSMENT/PLAN:   Assessment & Plan Generalized anxiety disorder  Primary insomnia  Flu vaccine need  COVID-19 vaccine administered    Assessment and Plan    Generalized anxiety disorder with insomnia Chronic anxiety with insomnia. Previous medications caused side effects. Lexapro  effective but requires lower dose. Abilify  discontinued due to weight gain. - Restart Lexapro  5 mg daily for 2 weeks, then increase to 10 mg daily. - Discontinue Abilify . - Consider sleep aid if insomnia persists. - Provided written medication instructions.  - Start trazadone, consider lunesta if trazadone ineffective.     General health - Flu and Covid administered.      Alyssa Avila A. Vita MD [facility]

## 2024-06-11 NOTE — Patient Instructions (Addendum)
-  GENERALIZED ANXIETY DISORDER WITH INSOMNIA: Generalized anxiety disorder is a condition characterized by excessive worry about various aspects of life, which can lead to sleep disturbances like insomnia. We will restart Lexapro  at a lower dose of 5 mg daily for 2 weeks, then increase to 10 mg daily. You should discontinue Abilify  due to its association with weight gain. If your insomnia persists, we may consider adding a sleep aid. Written medication instructions have been provided.  INSTRUCTIONS:  Please follow the medication instructions provided. Restart Lexapro  at 5 mg daily for 2 weeks, then increase to 10 mg daily. Discontinue Abilify . If your sleep issues continue, contact us  to discuss the possibility of adding a sleep aid.

## 2024-06-12 DIAGNOSIS — F411 Generalized anxiety disorder: Secondary | ICD-10-CM | POA: Diagnosis not present

## 2024-06-12 DIAGNOSIS — Z23 Encounter for immunization: Secondary | ICD-10-CM | POA: Diagnosis not present

## 2024-06-12 DIAGNOSIS — F5101 Primary insomnia: Secondary | ICD-10-CM | POA: Diagnosis not present

## 2024-07-04 DIAGNOSIS — Z419 Encounter for procedure for purposes other than remedying health state, unspecified: Secondary | ICD-10-CM | POA: Diagnosis not present

## 2024-07-26 ENCOUNTER — Ambulatory Visit: Payer: Self-pay | Admitting: Family Medicine

## 2024-07-26 VITALS — BP 124/76 | HR 58 | Wt 150.0 lb

## 2024-07-26 DIAGNOSIS — Z23 Encounter for immunization: Secondary | ICD-10-CM

## 2024-07-26 DIAGNOSIS — Z0000 Encounter for general adult medical examination without abnormal findings: Secondary | ICD-10-CM | POA: Diagnosis not present

## 2024-07-26 DIAGNOSIS — Z1151 Encounter for screening for human papillomavirus (HPV): Secondary | ICD-10-CM | POA: Diagnosis not present

## 2024-07-26 DIAGNOSIS — Z1211 Encounter for screening for malignant neoplasm of colon: Secondary | ICD-10-CM | POA: Diagnosis not present

## 2024-07-26 DIAGNOSIS — R5383 Other fatigue: Secondary | ICD-10-CM | POA: Diagnosis not present

## 2024-07-26 LAB — LIPID PANEL

## 2024-07-26 NOTE — Progress Notes (Signed)
   Name: Alyssa Avila   Date of Visit: 07/26/24   Date of last visit with me: 06/11/2024   CHIEF COMPLAINT:  Chief Complaint  Patient presents with   Annual Exam    Cpe. Has some pain in back, and hand.        HPI:  Discussed the use of AI scribe software for clinical note transcription with the patient, who gave verbal consent to proceed.  History of Present Illness Patient is here for physical exam. Patient states she is doing good and has no other issues at this time but does note she has some general aches and pains over the last 1 week. Patient has been on lexapro  for the past few months and notes less anxiety since starting medication;  Patient did have mammogram in January of this year.  Patient has never had a colonoscopy and has not seen a gynecologist nor has she done any Pap smear.  Patient moved here 20 years ago from [place].  Patient otherwise has no other concerns at this time     OBJECTIVE:       07/26/2024    3:59 PM  Depression screen PHQ 2/9  Decreased Interest 0  Down, Depressed, Hopeless 0  PHQ - 2 Score 0  Altered sleeping 0  Tired, decreased energy 1  Change in appetite 0  Feeling bad or failure about yourself  0  Trouble concentrating 0  Moving slowly or fidgety/restless 0  Suicidal thoughts 0  PHQ-9 Score 1     BP Readings from Last 3 Encounters:  07/26/24 124/76  06/11/24 132/80  05/04/24 120/84    BP 124/76   Pulse (!) 58   Wt 150 lb (68 kg)   SpO2 98%   BMI 27.44 kg/m    Physical Exam   Physical Exam Constitutional:      Appearance: Normal appearance.  Neurological:     General: No focal deficit present.     Mental Status: She is alert and oriented to person, place, and time. Mental status is at baseline.     ASSESSMENT/PLAN:   Assessment & Plan Annual physical exam  Encounter for screening for human papillomavirus (HPV)  Encounter for screening colonoscopy  Other fatigue    Assessment and Plan Assessment  & Plan  Annual physical exam -Colonoscopy referral - Recommend Boostrix  - Comprehensive annual physical exam completed today. Reviewed interval history, current medical issues, medications, allergies, and preventive care needs. Addressed all patient questions and concerns. Discussed lifestyle factors including diet, exercise, sleep, and stress management. Reviewed recommended age-appropriate screenings, labs, and vaccinations. Counseling provided on healthy habits and routine health maintenance. Follow-up as indicated based on findings and results.  Screening for cardiovascular disorder - Lipid panel pending  Fatigue - CMP and CBC pending   Anxiety-stable -Continue Lexapro  dosing       Celes Dedic A. Vita MD [facility]

## 2024-07-27 ENCOUNTER — Ambulatory Visit: Payer: Self-pay | Admitting: Family Medicine

## 2024-07-27 LAB — COMPREHENSIVE METABOLIC PANEL WITH GFR
ALT: 19 IU/L (ref 0–32)
AST: 19 IU/L (ref 0–40)
Albumin: 4.5 g/dL (ref 3.8–4.9)
Alkaline Phosphatase: 47 IU/L — AB (ref 49–135)
BUN/Creatinine Ratio: 16 (ref 9–23)
BUN: 11 mg/dL (ref 6–24)
Bilirubin Total: 0.2 mg/dL (ref 0.0–1.2)
CO2: 20 mmol/L (ref 20–29)
Calcium: 9.5 mg/dL (ref 8.7–10.2)
Chloride: 104 mmol/L (ref 96–106)
Creatinine, Ser: 0.67 mg/dL (ref 0.57–1.00)
Globulin, Total: 2.2 g/dL (ref 1.5–4.5)
Glucose: 88 mg/dL (ref 70–99)
Potassium: 4 mmol/L (ref 3.5–5.2)
Sodium: 143 mmol/L (ref 134–144)
Total Protein: 6.7 g/dL (ref 6.0–8.5)
eGFR: 104 mL/min/1.73 (ref >59)

## 2024-07-27 LAB — CBC WITH DIFFERENTIAL/PLATELET
Basophils Absolute: 0 x10E3/uL (ref 0.0–0.2)
Basos: 1 %
EOS (ABSOLUTE): 0.1 x10E3/uL (ref 0.0–0.4)
Eos: 3 %
Hematocrit: 38.3 % (ref 34.0–46.6)
Hemoglobin: 12.4 g/dL (ref 11.1–15.9)
Immature Grans (Abs): 0 x10E3/uL (ref 0.0–0.1)
Immature Granulocytes: 0 %
Lymphocytes Absolute: 1.8 x10E3/uL (ref 0.7–3.1)
Lymphs: 46 %
MCH: 28.4 pg (ref 26.6–33.0)
MCHC: 32.4 g/dL (ref 31.5–35.7)
MCV: 88 fL (ref 79–97)
Monocytes Absolute: 0.2 x10E3/uL (ref 0.1–0.9)
Monocytes: 5 %
Neutrophils Absolute: 1.9 x10E3/uL (ref 1.4–7.0)
Neutrophils: 45 %
Platelets: 249 x10E3/uL (ref 150–450)
RBC: 4.37 x10E6/uL (ref 3.77–5.28)
RDW: 12.6 % (ref 11.7–15.4)
WBC: 4.1 x10E3/uL (ref 3.4–10.8)

## 2024-07-27 LAB — LIPID PANEL
Cholesterol, Total: 206 mg/dL — AB (ref 100–199)
HDL: 47 mg/dL (ref >39)
LDL CALC COMMENT:: 4.4 ratio (ref 0.0–4.4)
LDL Chol Calc (NIH): 142 mg/dL — AB (ref 0–99)
Triglycerides: 93 mg/dL (ref 0–149)
VLDL Cholesterol Cal: 17 mg/dL (ref 5–40)

## 2024-08-03 DIAGNOSIS — Z419 Encounter for procedure for purposes other than remedying health state, unspecified: Secondary | ICD-10-CM | POA: Diagnosis not present

## 2024-09-24 ENCOUNTER — Encounter: Payer: Self-pay | Admitting: Gastroenterology

## 2025-07-29 ENCOUNTER — Encounter: Admitting: Family Medicine
